# Patient Record
Sex: Female | Born: 1975 | Race: Black or African American | Hispanic: No | Smoking: Current every day smoker
Health system: Southern US, Community
[De-identification: ages and names within clinical notes are randomized; demographics above are authoritative.]

## PROBLEM LIST (undated history)

## (undated) DIAGNOSIS — E7800 Pure hypercholesterolemia, unspecified: Secondary | ICD-10-CM

## (undated) DIAGNOSIS — R87629 Unspecified abnormal cytological findings in specimens from vagina: Secondary | ICD-10-CM

## (undated) DIAGNOSIS — I639 Cerebral infarction, unspecified: Secondary | ICD-10-CM

## (undated) HISTORY — PX: WISDOM TOOTH EXTRACTION: SHX21

## (undated) HISTORY — PX: TUBAL LIGATION: SHX77

---

## 2003-03-23 ENCOUNTER — Inpatient Hospital Stay (HOSPITAL_COMMUNITY): Admission: AD | Admit: 2003-03-23 | Discharge: 2003-03-23 | Payer: Self-pay | Admitting: Obstetrics and Gynecology

## 2003-04-03 ENCOUNTER — Emergency Department (HOSPITAL_COMMUNITY): Admission: EM | Admit: 2003-04-03 | Discharge: 2003-04-03 | Payer: Self-pay | Admitting: Emergency Medicine

## 2003-05-22 ENCOUNTER — Inpatient Hospital Stay (HOSPITAL_COMMUNITY): Admission: AD | Admit: 2003-05-22 | Discharge: 2003-05-22 | Payer: Self-pay | Admitting: *Deleted

## 2003-06-02 ENCOUNTER — Ambulatory Visit (HOSPITAL_COMMUNITY): Admission: RE | Admit: 2003-06-02 | Discharge: 2003-06-02 | Payer: Self-pay | Admitting: *Deleted

## 2003-06-04 ENCOUNTER — Inpatient Hospital Stay (HOSPITAL_COMMUNITY): Admission: AD | Admit: 2003-06-04 | Discharge: 2003-06-04 | Payer: Self-pay | Admitting: Obstetrics and Gynecology

## 2003-08-13 ENCOUNTER — Emergency Department (HOSPITAL_COMMUNITY): Admission: EM | Admit: 2003-08-13 | Discharge: 2003-08-13 | Payer: Self-pay | Admitting: Emergency Medicine

## 2003-10-22 ENCOUNTER — Encounter: Admission: RE | Admit: 2003-10-22 | Discharge: 2003-10-22 | Payer: Self-pay | Admitting: *Deleted

## 2003-10-29 ENCOUNTER — Inpatient Hospital Stay (HOSPITAL_COMMUNITY): Admission: AD | Admit: 2003-10-29 | Discharge: 2003-10-31 | Payer: Self-pay | Admitting: Obstetrics and Gynecology

## 2003-10-30 ENCOUNTER — Encounter (INDEPENDENT_AMBULATORY_CARE_PROVIDER_SITE_OTHER): Payer: Self-pay

## 2003-10-31 ENCOUNTER — Inpatient Hospital Stay (HOSPITAL_COMMUNITY): Admission: AD | Admit: 2003-10-31 | Discharge: 2003-10-31 | Payer: Self-pay | Admitting: *Deleted

## 2004-07-19 ENCOUNTER — Emergency Department (HOSPITAL_COMMUNITY): Admission: EM | Admit: 2004-07-19 | Discharge: 2004-07-19 | Payer: Self-pay | Admitting: Emergency Medicine

## 2004-12-14 ENCOUNTER — Emergency Department (HOSPITAL_COMMUNITY): Admission: EM | Admit: 2004-12-14 | Discharge: 2004-12-14 | Payer: Self-pay | Admitting: Emergency Medicine

## 2005-01-14 ENCOUNTER — Inpatient Hospital Stay (HOSPITAL_COMMUNITY): Admission: AD | Admit: 2005-01-14 | Discharge: 2005-01-14 | Payer: Self-pay | Admitting: Family Medicine

## 2005-01-22 ENCOUNTER — Inpatient Hospital Stay (HOSPITAL_COMMUNITY): Admission: AD | Admit: 2005-01-22 | Discharge: 2005-01-22 | Payer: Self-pay | Admitting: Obstetrics and Gynecology

## 2005-03-05 ENCOUNTER — Inpatient Hospital Stay (HOSPITAL_COMMUNITY): Admission: AD | Admit: 2005-03-05 | Discharge: 2005-03-05 | Payer: Self-pay | Admitting: *Deleted

## 2005-03-06 ENCOUNTER — Emergency Department (HOSPITAL_COMMUNITY): Admission: EM | Admit: 2005-03-06 | Discharge: 2005-03-06 | Payer: Self-pay | Admitting: Emergency Medicine

## 2005-03-08 ENCOUNTER — Inpatient Hospital Stay (HOSPITAL_COMMUNITY): Admission: AD | Admit: 2005-03-08 | Discharge: 2005-03-08 | Payer: Self-pay | Admitting: *Deleted

## 2005-08-09 ENCOUNTER — Ambulatory Visit: Payer: Self-pay | Admitting: Obstetrics and Gynecology

## 2005-08-17 ENCOUNTER — Inpatient Hospital Stay (HOSPITAL_COMMUNITY): Admission: AD | Admit: 2005-08-17 | Discharge: 2005-08-17 | Payer: Self-pay | Admitting: Obstetrics and Gynecology

## 2005-10-19 ENCOUNTER — Emergency Department (HOSPITAL_COMMUNITY): Admission: EM | Admit: 2005-10-19 | Discharge: 2005-10-19 | Payer: Self-pay | Admitting: Emergency Medicine

## 2005-10-24 ENCOUNTER — Emergency Department (HOSPITAL_COMMUNITY): Admission: EM | Admit: 2005-10-24 | Discharge: 2005-10-24 | Payer: Self-pay | Admitting: Emergency Medicine

## 2005-12-19 ENCOUNTER — Emergency Department (HOSPITAL_COMMUNITY): Admission: EM | Admit: 2005-12-19 | Discharge: 2005-12-19 | Payer: Self-pay | Admitting: Emergency Medicine

## 2006-01-10 ENCOUNTER — Encounter: Admission: RE | Admit: 2006-01-10 | Discharge: 2006-01-18 | Payer: Self-pay | Admitting: Orthopaedic Surgery

## 2007-04-10 ENCOUNTER — Emergency Department (HOSPITAL_COMMUNITY): Admission: EM | Admit: 2007-04-10 | Discharge: 2007-04-10 | Payer: Self-pay | Admitting: Emergency Medicine

## 2007-04-15 ENCOUNTER — Emergency Department (HOSPITAL_COMMUNITY): Admission: EM | Admit: 2007-04-15 | Discharge: 2007-04-15 | Payer: Self-pay | Admitting: Emergency Medicine

## 2007-10-10 ENCOUNTER — Ambulatory Visit: Payer: Self-pay | Admitting: *Deleted

## 2007-12-29 ENCOUNTER — Inpatient Hospital Stay (HOSPITAL_COMMUNITY): Admission: AD | Admit: 2007-12-29 | Discharge: 2007-12-29 | Payer: Self-pay | Admitting: Obstetrics & Gynecology

## 2008-02-01 ENCOUNTER — Inpatient Hospital Stay (HOSPITAL_COMMUNITY): Admission: AD | Admit: 2008-02-01 | Discharge: 2008-02-01 | Payer: Self-pay | Admitting: Gynecology

## 2008-04-28 ENCOUNTER — Inpatient Hospital Stay (HOSPITAL_COMMUNITY): Admission: AD | Admit: 2008-04-28 | Discharge: 2008-04-28 | Payer: Self-pay | Admitting: Obstetrics & Gynecology

## 2008-05-26 ENCOUNTER — Emergency Department (HOSPITAL_COMMUNITY): Admission: EM | Admit: 2008-05-26 | Discharge: 2008-05-26 | Payer: Self-pay | Admitting: Emergency Medicine

## 2008-12-01 ENCOUNTER — Inpatient Hospital Stay (HOSPITAL_COMMUNITY): Admission: AD | Admit: 2008-12-01 | Discharge: 2008-12-01 | Payer: Self-pay | Admitting: Obstetrics & Gynecology

## 2009-01-06 ENCOUNTER — Emergency Department (HOSPITAL_COMMUNITY): Admission: EM | Admit: 2009-01-06 | Discharge: 2009-01-06 | Payer: Self-pay | Admitting: Emergency Medicine

## 2009-04-01 ENCOUNTER — Ambulatory Visit: Payer: Self-pay | Admitting: Obstetrics and Gynecology

## 2009-04-01 ENCOUNTER — Encounter: Payer: Self-pay | Admitting: Obstetrics and Gynecology

## 2009-04-25 ENCOUNTER — Emergency Department (HOSPITAL_COMMUNITY): Admission: EM | Admit: 2009-04-25 | Discharge: 2009-04-25 | Payer: Self-pay | Admitting: Emergency Medicine

## 2009-04-29 ENCOUNTER — Inpatient Hospital Stay (HOSPITAL_COMMUNITY): Admission: AD | Admit: 2009-04-29 | Discharge: 2009-04-29 | Payer: Self-pay | Admitting: Obstetrics & Gynecology

## 2009-06-19 ENCOUNTER — Emergency Department (HOSPITAL_COMMUNITY): Admission: EM | Admit: 2009-06-19 | Discharge: 2009-06-19 | Payer: Self-pay | Admitting: Emergency Medicine

## 2009-10-04 ENCOUNTER — Emergency Department (HOSPITAL_COMMUNITY): Admission: EM | Admit: 2009-10-04 | Discharge: 2009-10-04 | Payer: Self-pay | Admitting: Emergency Medicine

## 2009-12-06 ENCOUNTER — Inpatient Hospital Stay (HOSPITAL_COMMUNITY): Admission: AD | Admit: 2009-12-06 | Discharge: 2009-12-06 | Payer: Self-pay | Admitting: Obstetrics & Gynecology

## 2010-07-31 ENCOUNTER — Emergency Department (HOSPITAL_COMMUNITY): Admission: EM | Admit: 2010-07-31 | Discharge: 2010-07-31 | Payer: Self-pay | Admitting: Emergency Medicine

## 2011-01-23 LAB — WET PREP, GENITAL
Clue Cells Wet Prep HPF POC: NONE SEEN
Trich, Wet Prep: NONE SEEN
Yeast Wet Prep HPF POC: NONE SEEN

## 2011-01-23 LAB — GC/CHLAMYDIA PROBE AMP, GENITAL: GC Probe Amp, Genital: NEGATIVE

## 2011-02-09 LAB — DIFFERENTIAL
Basophils Absolute: 0.1 10*3/uL (ref 0.0–0.1)
Eosinophils Relative: 1 % (ref 0–5)
Lymphocytes Relative: 16 % (ref 12–46)
Monocytes Absolute: 0.6 10*3/uL (ref 0.1–1.0)
Monocytes Relative: 7 % (ref 3–12)

## 2011-02-09 LAB — URINALYSIS, ROUTINE W REFLEX MICROSCOPIC
Bilirubin Urine: NEGATIVE
Protein, ur: NEGATIVE mg/dL
Urobilinogen, UA: 0.2 mg/dL (ref 0.0–1.0)

## 2011-02-09 LAB — CBC
HCT: 35 % — ABNORMAL LOW (ref 36.0–46.0)
Hemoglobin: 11.5 g/dL — ABNORMAL LOW (ref 12.0–15.0)
MCHC: 33 g/dL (ref 30.0–36.0)
RBC: 3.89 MIL/uL (ref 3.87–5.11)
RDW: 15.7 % — ABNORMAL HIGH (ref 11.5–15.5)

## 2011-02-09 LAB — BASIC METABOLIC PANEL
CO2: 25 mEq/L (ref 19–32)
GFR calc non Af Amer: >60 mL/min (ref >60)
Glucose, Bld: 61 mg/dL — ABNORMAL LOW (ref 70–99)
Potassium: 3.7 mEq/L (ref 3.5–5.1)
Sodium: 139 mEq/L (ref 135–145)

## 2011-02-14 LAB — WET PREP, GENITAL

## 2011-02-15 LAB — POCT URINALYSIS DIP (DEVICE)
Bilirubin Urine: NEGATIVE
Ketones, ur: NEGATIVE mg/dL
Protein, ur: NEGATIVE mg/dL
Specific Gravity, Urine: >=1.03 (ref 1.005–1.030)

## 2011-02-21 LAB — WET PREP, GENITAL
Trich, Wet Prep: NONE SEEN
Yeast Wet Prep HPF POC: NONE SEEN

## 2011-02-21 LAB — GC/CHLAMYDIA PROBE AMP, GENITAL
Chlamydia, DNA Probe: NEGATIVE
GC Probe Amp, Genital: NEGATIVE

## 2011-02-21 LAB — POCT PREGNANCY, URINE: Preg Test, Ur: NEGATIVE

## 2011-03-22 NOTE — Group Therapy Note (Signed)
NAME:  Wendy Ross, Wendy Ross                ACCOUNT NO.:  [id number]   MEDICAL RECORD NO.:  [id number]          PATIENT TYPE:  WOC   LOCATION:  [facility]                   FACILITY:  [facility]   PHYSICIAN:  Karilyn Cota    DATE OF BIRTH:  May 17, 1976   DATE OF SERVICE:                                  CLINIC NOTE   CHIEF COMPLAINT:  Well woman examination with Pap smear and gonorrhea  and Chlamydia cultures.   HISTORY OF PRESENT ILLNESS:  This is a 35 year old gravida 6, para 4-0-2-  4 who presents for her well woman annual exam and Pap smear.  She also  requests GC Chlamydia cultures.  Her last Pap smear was in December 2008  and was normal.  She reports having a yeast infection 1-2 weeks ago that  she self treated with over-the-counter products with some relief but  still has occasional itching every 2-3 days.  She is having normal  periods with no problems.  She does report hypercholesterolemia that is  treated by her medical doctor and otherwise has no complaints.   PAST MEDICAL HISTORY:  Remarkable for hypercholesterolemia for which she  takes a medication of unknown name that starts with a P twice a day.   PAST SURGICAL HISTORY:  Remarkable for a bilateral tubal ligation.   OBSTETRICS HISTORY:  Remarkable for a vaginal birth and 2 ABs of unknown  type.  She is therefore gravida 6, para 4-0-2-4.   FAMILY HISTORY:  Remarkable for hypertension in her mother, brother and  sister and several members with uterine cancer.   SOCIAL HISTORY:  The patient is the mother of 4.  She smokes  infrequently, as little as 1 cigarette per day.   PHYSICAL EXAMINATION:  GENERAL:  Appears well in no distress.  VITAL SIGNS:  Stable and recorded on chart.  HEENT:  Within normal limits.  Thyroid, not enlarged.  CHEST:  Clear to auscultation.  HEART:  Regular rate and rhythm.  SPINE:  Nontender with negative CVA tenderness.  ABDOMEN:  Soft, nontender with no masses.  Positive bowel sounds.  BREASTS:   Soft, nontender with no masses or unusual findings.  GENITOURINARY:  Normal female external genitalia with no lesions or  erythema.  Vaginal mucosa is pink and moist with small amount of thin  white discharge.  Cervix is anterior, just under the symphysis with the  uterus being slightly retroverted.  Adnexa are normal without masses and  nontender.  EXTREMITIES:  Show no edema or cyanosis.   Pap smear was collected and GC Chlamydia cultures were added to that.  UA is negative with a specific gravity of 1.030 and small blood,  otherwise normal finding.   ASSESSMENT:  70. A 35 year old gravida 6, para 4-0-2-4 (non-pregnant).  Presented to      take her well woman exam.  2. Resolved yeast vaginitis.  3. Pap smear and general cultures requested.   PLAN:  1. Pap sent with GC Chlamydia added.  2. Routine health maintenance to be followed up by her primary      physician.  3. Perineal hygiene was reviewed.  ______________________________  Karilyn Cota     MW/MEDQ  D:  04/01/2009  T:  04/01/2009  Job:  [id number]

## 2011-03-22 NOTE — Group Therapy Note (Signed)
NAME:  Wendy Ross, Wendy Ross                ACCOUNT NO.:  [id number]   MEDICAL RECORD NO.:  [id number]          PATIENT TYPE:  WOC   LOCATION:  [facility]                   FACILITY:  [facility]   PHYSICIAN:  Karlton Lemon, MD      DATE OF BIRTH:  November 28, 1975   DATE OF SERVICE:                                  CLINIC NOTE   CHIEF COMPLAINT:  Well woman examination with pap smear, pain in left  breast, urinary frequency.   HISTORY OF PRESENT ILLNESS:  This is a 35 year old G6 P4-0-2-4,  presenting for a well woman examination and pap smear. The patient's  last pap smear was in October 2006 and at that time was negative  intraepithelial lesion or malignancy. She currently states that she is  having normal periods, though they have decreased in duration from 5 to  3 days. They are still coming monthly and she is just using 3 to 4 pads  daily on her heaviest days. She also complains of pain in her left  lateral breast that is intermittent. She is concerned that it may be  related to strain on it from working. She says that she feels heaviness  in her right breast as well, but she does not feel the pain. She has  done monthly self-breast exams and notes no dimpling, discharge, or  mass. She also states that she is having urinary frequency and needs to  go to the bathroom shortly after she drinks anything. She denies any  dysuria or gross hematuria.   PAST MEDICAL HISTORY:  None.   PAST SURGICAL HISTORY:  Bilateral tubal ligation.   OBSTETRICAL HISTORY:  Gravida 5 para 4-0-2-4. No Caesarean sections.   FAMILY HISTORY:  Hypertension in mother, brother, and sister. She states  that she has had family members with uterine cancer. She denies  history of breast cancer.   SOCIAL HISTORY:  The patient has four children living with her. She  smokes infrequently stating that she will smoke 1 cigarette per day.   PHYSICAL EXAMINATION:  GENERAL:  Well appearing female in no distress.  VITAL SIGNS:   Temperature 97.7, pulse 91, blood pressure 107/67,  respirations 18, weight 176.6 pounds.  CARDIOVASCULAR:  Heart is regular rate and rhythm with no murmurs, rubs,  or gallops.  LUNGS:  Clear to auscultation bilaterally.  ABDOMEN:  Soft and nontender to palpation. Positive bowel sounds in all  four quadrants.  BREASTS:  Left breast is slightly larger than the right breast.They are  pendulous, no dimpling of either breast noted. There is no mass  palpated. No discharge is expressed from the nipple.  GENITOURINARY:  Normal female external genitalia. No lesion is noted.  Vaginal mucosa is pink and moist. Cervix is midline. Uterus is slightly  retroflexed and normal sized adnexa are palpable and not increased in  size. There is no tenderness and no palpation of the adnexa.  EXTREMITIES:  No cyanosis, clubbing, or edema.   LABORATORY DATA:  The patient had a urine pregnancy test performed here  that was negative. She had a urinalysis that showed a small amount of  blood, trace  leukocyte esterase, negative nitrates.   ASSESSMENT AND PLAN:  This is a 35 year old African-American female G6  P4-0-2-4 presenting for a well woman examination with complaints of left  breast pain and urinary frequency.  1. Breast exam is performed today without any suspicious lesions or      changes suggestive of breast cancer. We will counsel to continue      monthly self-breast examinations and use Motrin every 8 hours daily      to control the pain.  2. Pap smear is performed today. The patient is to follow up in one      year for repeat pap smear unless changes on the pap smear dictate      otherwise.  3. The patient is counseled to increase her water intake. This may      help with her urinary symptoms. We will send the urine for a      culture and sensitivity, as there was possible leukocyte esterase      and a small amount of blood that may be indicative of infection.            ______________________________  Karlton Lemon, MD     NS/MEDQ  D:  10/10/2007  T:  10/11/2007  Job:  [id number]

## 2011-03-25 NOTE — Group Therapy Note (Signed)
NAME:  LEAKS, Nelson                ACCOUNT NO.:  [id number]   MEDICAL RECORD NO.:  [id number]          PATIENT TYPE:  WOC   LOCATION:  [facility]                   FACILITY:  [facility]   PHYSICIAN:  Argentina Donovan, MD        DATE OF BIRTH:  October 26, 1976   DATE OF SERVICE:  08/09/2005                                    CLINIC NOTE   CHIEF COMPLAINT:  Patient came for yearly check-up and also clear vaginal  discharge.   SUBJECTIVE:  A 35 year old African-American female who comes in for her  gynecological annual examination, also with a clear white vaginal discharge,  watery, without any itching or odor.   ALLERGIES:  None.   CURRENT MEDICATIONS:  None.   MENSTRUAL HISTORY:  Last menstrual period July 27, 2005.  Menstrual  cycles regular.  Menarche at 35 years old.  Usually heavy, painful periods  without any bleeding between periods.   CONTRACEPTIVE HISTORY:  Patient had a bilateral tubal ligation December  2004.  No possibility to get pregnant at this moment.   OBSTETRICAL HISTORY:  G6, para 6-0-2-4.  No cesarean sections.   GYNECOLOGICAL HISTORY:  Last Pap smear in April 2005.  History of one  abnormal Pap smear in 1995.  No other treatments.   FAMILY HISTORY:  Hypertension in mom, brother and sister.  Cancer with  __________ cancer in grandfather and uncle.   SOCIAL HISTORY:  Patient has four children living with her.  She drinks  alcohol occasionally and smokes one pack of cigarettes per day during the  last eight years.   OBJECTIVE:  VITAL SIGNS:  Reviewed on the chart, within normal limits.  GENERAL:  No acute distress.  CARDIOVASCULAR:  RRR.  No murmur, rub or gallop.  RESPIRATORY:  Clear to auscultation bilaterally.  BREASTS:  Within normal limits bilaterally.  No nipple discharge.  GENITOURINARY:  Speculum examination shows __________.  Thick discharge in  the vault of the vagina.  Cervix shows two nabothian cysts at 2 and 5  o'clock.  Bimanual examination:  There  are no abdominal masses, no adnexal  masses, no cervical motion tenderness.  External genitalia within normal  limits.   ASSESSMENT:  A 36 year old African-American female who comes originally for  annual yearly physical examination.  Gynecological Pap smear performed and  GC/Chlamydia performed.  Wet prep performed and  analyzed at the office.  Positive for Trichomonas and positive for clue cells.   PLAN:  Treatment for Trichomonas and bacterial vaginosis.  Metronidazole 500  mg b.i.d. x7 days prescribed to patient.  Patient advised to use condoms and  have sexual partner obtain treatment.  Pap smear results will be notified to  patient by mail or phone.     ______________________________  Latonda Larrivee    ______________________________  Argentina Donovan, MD    /MEDQ  D:  08/09/2005  T:  08/10/2005  Job:  [id number]

## 2011-03-25 NOTE — Op Note (Signed)
NAME:  Wendy Ross, Wendy Ross                          ACCOUNT NO.:  [id number]   MEDICAL RECORD NO.:  [id number]                   PATIENT TYPE:  INP   LOCATION:  9199                                 FACILITY:  [facility]   PHYSICIAN:  Lesly Dukes, M.D.              DATE OF BIRTH:  Oct 18, 1976   DATE OF PROCEDURE:  10/30/2003  DATE OF DISCHARGE:                                 OPERATIVE REPORT   PREOPERATIVE DIAGNOSIS:  A para 4-0-2-4 desiring permanent sterilization.   POSTOPERATIVE DIAGNOSIS:  A para 4-0-2-4 desiring permanent sterilization.   PROCEDURE:  Postpartum bilateral tubal ligation via the Pomeroy method.[   SURGEON:  Lesly Dukes, M.D.   ANESTHESIA:  Epidural.   ESTIMATED BLOOD LOSS:  Minimal.   COMPLICATIONS:  None.   PATHOLOGY:  Portions of bilateral fallopian tubes.   DESCRIPTION OF PROCEDURE:  After informed consent was obtained, the patient  was taken to the operating room, where epidural anesthesia was found to be  adequate.  The patient prepared and draped in normal sterile fashion while  in supine position.  The infraumbilical skin incision was made with a  scalpel and carried down to the underlying layer of fascia bluntly.  The  fascia was then elevated, incised with the Mayo scissors, and tagged with  Vicryl.  The peritoneum was identified and then entered bluntly using a  Kelly clamp.  The infraumbilical skin incision was then brought down over  the left fallopian tube, which was grabbed with a Babcock clamp, then  followed out to its fimbriated end.  A knuckle was then made with the  Babcock and then 2-0 plain was used to suture ligate the left fallopian  tube.  A second free tie of 2-0 plain was used to ensure the ligature was  tight.  The fallopian tube was then transected and noted to be hemostatic.  The left fallopian tube was then returned to the abdomen, and the right  fallopian tube was then identified, picked up with a Babcock, and followed  out to  the fimbriated end.  The middle portion of the fallopian tube was  then tented up with the Babcock, doubly ligated with 2-0 plain, transected  with Metzenbaum scissors.  The right fallopian tube was also found to be  hemostatic.  Both portions of fallopian tubes were sent to pathology.  The  right fallopian tube was returned to the abdomen.  The fascia was then  closed with 0 Vicryl in a running fashion and the skin was closed with  Dermabond.  The patient tolerated the procedure well.  The sponge, lap,  instrument, and needle count was correct x2.  The patient went to the  recovery room in stable condition.  Lesly Dukes, M.D.    Lora Paula  D:  10/30/2003  T:  10/30/2003  Job:  [id number]

## 2011-03-29 ENCOUNTER — Emergency Department (HOSPITAL_COMMUNITY)
Admission: EM | Admit: 2011-03-29 | Discharge: 2011-03-29 | Disposition: A | Discharge status: Home / Self Care | Payer: Self-pay | Attending: Emergency Medicine | Admitting: Emergency Medicine

## 2011-03-29 DIAGNOSIS — N760 Acute vaginitis: Secondary | ICD-10-CM | POA: Insufficient documentation

## 2011-03-29 DIAGNOSIS — X58XXXA Exposure to other specified factors, initial encounter: Secondary | ICD-10-CM | POA: Insufficient documentation

## 2011-03-29 DIAGNOSIS — T148XXA Other injury of unspecified body region, initial encounter: Secondary | ICD-10-CM | POA: Insufficient documentation

## 2011-03-29 DIAGNOSIS — M545 Low back pain, unspecified: Secondary | ICD-10-CM | POA: Insufficient documentation

## 2011-03-29 DIAGNOSIS — A499 Bacterial infection, unspecified: Secondary | ICD-10-CM | POA: Insufficient documentation

## 2011-03-29 DIAGNOSIS — B9689 Other specified bacterial agents as the cause of diseases classified elsewhere: Secondary | ICD-10-CM | POA: Insufficient documentation

## 2011-03-29 LAB — URINALYSIS, ROUTINE W REFLEX MICROSCOPIC
Glucose, UA: NEGATIVE mg/dL
Protein, ur: NEGATIVE mg/dL
pH: 6 (ref 5.0–8.0)

## 2011-03-29 LAB — WET PREP, GENITAL: Yeast Wet Prep HPF POC: NONE SEEN

## 2011-07-29 LAB — POCT PREGNANCY, URINE
Operator id: 117411
Preg Test, Ur: NEGATIVE

## 2011-07-29 LAB — URINALYSIS, ROUTINE W REFLEX MICROSCOPIC
Ketones, ur: 15 — AB
Specific Gravity, Urine: >1.03 — ABNORMAL HIGH
Urobilinogen, UA: 0.2

## 2011-07-29 LAB — GC/CHLAMYDIA PROBE AMP, GENITAL: GC Probe Amp, Genital: NEGATIVE

## 2011-07-29 LAB — WET PREP, GENITAL: Yeast Wet Prep HPF POC: NONE SEEN

## 2011-07-29 LAB — URINE MICROSCOPIC-ADD ON

## 2011-08-01 LAB — POCT PREGNANCY, URINE
Operator id: 202651
Preg Test, Ur: NEGATIVE

## 2011-08-01 LAB — WET PREP, GENITAL: Yeast Wet Prep HPF POC: NONE SEEN

## 2011-08-01 LAB — GC/CHLAMYDIA PROBE AMP, GENITAL: Chlamydia, DNA Probe: NEGATIVE

## 2011-08-04 LAB — URINALYSIS, ROUTINE W REFLEX MICROSCOPIC
Glucose, UA: NEGATIVE
Specific Gravity, Urine: 1.025

## 2011-08-04 LAB — GC/CHLAMYDIA PROBE AMP, GENITAL
Chlamydia, DNA Probe: NEGATIVE
GC Probe Amp, Genital: NEGATIVE

## 2011-08-04 LAB — URINE MICROSCOPIC-ADD ON

## 2011-08-05 LAB — POCT I-STAT, CHEM 8
Creatinine, Ser: 0.9
Glucose, Bld: 75
Hemoglobin: 13.3
Potassium: 3.8

## 2011-08-05 LAB — WET PREP, GENITAL
Trich, Wet Prep: NONE SEEN
Yeast Wet Prep HPF POC: NONE SEEN

## 2011-08-05 LAB — URINALYSIS, ROUTINE W REFLEX MICROSCOPIC
Glucose, UA: NEGATIVE
Protein, ur: NEGATIVE
pH: 6.5

## 2011-08-05 LAB — GC/CHLAMYDIA PROBE AMP, GENITAL: GC Probe Amp, Genital: NEGATIVE

## 2011-08-15 LAB — POCT URINALYSIS DIP (DEVICE)
Bilirubin Urine: NEGATIVE
Bilirubin Urine: NEGATIVE
Ketones, ur: NEGATIVE
Ketones, ur: NEGATIVE
Nitrite: NEGATIVE
Specific Gravity, Urine: 1.02
Specific Gravity, Urine: 1.02
pH: 5.5
pH: 6

## 2011-10-19 ENCOUNTER — Encounter: Payer: Self-pay | Admitting: Adult Health

## 2011-10-19 ENCOUNTER — Emergency Department (HOSPITAL_COMMUNITY)
Admission: EM | Admit: 2011-10-19 | Discharge: 2011-10-19 | Disposition: A | Discharge status: Home / Self Care | Payer: Self-pay | Attending: Emergency Medicine | Admitting: Emergency Medicine

## 2011-10-19 DIAGNOSIS — J45909 Unspecified asthma, uncomplicated: Secondary | ICD-10-CM | POA: Insufficient documentation

## 2011-10-19 DIAGNOSIS — R6889 Other general symptoms and signs: Secondary | ICD-10-CM

## 2011-10-19 DIAGNOSIS — E7800 Pure hypercholesterolemia, unspecified: Secondary | ICD-10-CM | POA: Insufficient documentation

## 2011-10-19 DIAGNOSIS — J111 Influenza due to unidentified influenza virus with other respiratory manifestations: Secondary | ICD-10-CM | POA: Insufficient documentation

## 2011-10-19 HISTORY — DX: Pure hypercholesterolemia, unspecified: E78.00

## 2011-10-19 LAB — RAPID STREP SCREEN (MED CTR MEBANE ONLY): Streptococcus, Group A Screen (Direct): NEGATIVE

## 2011-10-19 MED ORDER — ONDANSETRON 4 MG PO TBDP
4.0000 mg | ORAL_TABLET | Freq: Once | ORAL | Status: AC
  Administered 2011-10-19: 4 mg via ORAL
  Filled 2011-10-19: qty 1

## 2011-10-19 MED ORDER — KETOROLAC TROMETHAMINE 30 MG/ML IJ SOLN
30.0000 mg | Freq: Once | INTRAMUSCULAR | Status: AC
  Administered 2011-10-19: 30 mg via INTRAMUSCULAR
  Filled 2011-10-19: qty 1

## 2011-10-19 MED ORDER — IBUPROFEN 600 MG PO TABS: 600.0000 mg | ORAL_TABLET | Freq: Four times a day (QID) | ORAL | Status: AC | PRN

## 2011-10-19 MED ORDER — OSELTAMIVIR PHOSPHATE 75 MG PO CAPS: 75.0000 mg | ORAL_CAPSULE | Freq: Two times a day (BID) | ORAL | Status: AC

## 2011-10-19 NOTE — ED Provider Notes (Signed)
History     CSN: [id number] Arrival date & time: 10/19/2011  8:15 AM   First MD Initiated Contact with Patient 10/19/11 [phone]      Chief Complaint  Patient presents with  . Influenza   HPI  35yoF h/o well controlled asthma pw multiple complaints. Yesterday began exp diffuse body aches, frontal headache, nasal congestion, sore throat, rhinorrhea. Mild nausea, occ post tussive emesis. Denies abdominal pain, back pain, chest pain. No diarrhea. Denies hematuria/dysuria/freq/urgency. Daughter recently with URI sx.Denies h/o DM, HIV. Denies other complaints today.  Notes, ED Provider Notes from 10/19/11 0000 to 10/19/11 08:19:54       Janett Billow Delories Heinz, RN 10/19/2011 08:18      C/o cough, body aches and coughing up phlem. States her head and body hurts. Started yesterday. vomitted 2, denies diarrhea.     Past Medical History  Diagnosis Date  . Asthma   . Hypercholesteremia     History reviewed. No pertinent past surgical history.  History reviewed. No pertinent family history.  History  Substance Use Topics  . Smoking status: Current Everyday Smoker  . Smokeless tobacco: Not on file  . Alcohol Use: No    OB History    Grav Para Term Preterm Abortions TAB SAB Ect Mult Living                  Review of Systems  All other systems reviewed and are negative.   except as noted HPI   Allergies  Review of patient's allergies indicates no known allergies.  Home Medications   Current Outpatient Rx  Name Route Sig Dispense Refill  . IBUPROFEN 600 MG PO TABS Oral Take 1 tablet (600 mg total) by mouth every 6 (six) hours as needed for pain. 30 tablet 0  . OSELTAMIVIR PHOSPHATE 75 MG PO CAPS Oral Take 1 capsule (75 mg total) by mouth every 12 (twelve) hours. 10 capsule 0    BP 109/73  Pulse 86  Temp(Src) 98.6 F (37 C) (Oral)  Resp 18  SpO2 100%  LMP 10/05/2011  Physical Exam  Nursing note and vitals reviewed. Constitutional: She is oriented to person, place, and  time. She appears well-developed.  HENT:  Head: Atraumatic.  Mouth/Throat: Oropharynx is clear and moist.       Mild posterior OP erythema Uvula midline No exudates No trismus  Eyes: Conjunctivae and EOM are normal. Pupils are equal, round, and reactive to light.  Neck: Normal range of motion. Neck supple.  Cardiovascular: Normal rate, regular rhythm, normal heart sounds and intact distal pulses.   Pulmonary/Chest: Effort normal and breath sounds normal. No respiratory distress. She has no wheezes. She has no rales.  Abdominal: Soft. She exhibits no distension. There is no tenderness. There is no rebound and no guarding.  Musculoskeletal: Normal range of motion.  Neurological: She is alert and oriented to person, place, and time.  Skin: Skin is warm and dry. No rash noted.  Psychiatric: She has a normal mood and affect.    ED Course  Procedures (including critical care time)   Labs Reviewed  RAPID STREP SCREEN  STREP A DNA PROBE   No results found.   1. Flu-like symptoms     MDM  Flu like illness. Immunocompetent. Check rapid strep. Toradol, zofran ODT. Anticipate d/c home with tamiflu given onset of sx yesterday.  Stefano Gaul, MD  Rapid strep neg. Culture sent. Home with Tamiflu, supportive care         Leigh-Ann  Hyman Hopes, MD 10/19/11 [phone]

## 2011-10-19 NOTE — ED Notes (Signed)
Pt reports body aches, productive cough, sore throat x1 day. Pt is A/O x4. Skin warm and dry. Respirations even and unlabored. NAD noted at this time.

## 2011-10-19 NOTE — ED Notes (Signed)
C/o cough, body aches and coughing up phlem. States her head and body hurts. Started yesterday. vomitted 2, denies diarrhea.

## 2011-10-20 LAB — STREP A DNA PROBE: Group A Strep Probe: NEGATIVE

## 2012-09-10 ENCOUNTER — Other Ambulatory Visit: Payer: Self-pay | Admitting: Family Medicine

## 2012-09-10 ENCOUNTER — Ambulatory Visit
Admission: RE | Admit: 2012-09-10 | Discharge: 2012-09-10 | Disposition: A | Discharge status: Home / Self Care | Payer: Medicaid Other | Source: Ambulatory Visit | Attending: Family Medicine | Admitting: Family Medicine

## 2012-09-10 DIAGNOSIS — F17200 Nicotine dependence, unspecified, uncomplicated: Secondary | ICD-10-CM

## 2012-09-10 DIAGNOSIS — M545 Low back pain: Secondary | ICD-10-CM

## 2012-09-10 DIAGNOSIS — M25559 Pain in unspecified hip: Secondary | ICD-10-CM

## 2013-01-09 ENCOUNTER — Encounter (HOSPITAL_COMMUNITY): Payer: Self-pay

## 2013-01-09 ENCOUNTER — Emergency Department (HOSPITAL_COMMUNITY)
Admission: EM | Admit: 2013-01-09 | Discharge: 2013-01-09 | Disposition: A | Discharge status: Home / Self Care | Payer: Medicaid Other | Attending: Emergency Medicine | Admitting: Emergency Medicine

## 2013-01-09 ENCOUNTER — Emergency Department (HOSPITAL_COMMUNITY): Payer: Medicaid Other

## 2013-01-09 DIAGNOSIS — M62838 Other muscle spasm: Secondary | ICD-10-CM | POA: Insufficient documentation

## 2013-01-09 DIAGNOSIS — Z809 Family history of malignant neoplasm, unspecified: Secondary | ICD-10-CM | POA: Insufficient documentation

## 2013-01-09 DIAGNOSIS — J45909 Unspecified asthma, uncomplicated: Secondary | ICD-10-CM | POA: Insufficient documentation

## 2013-01-09 DIAGNOSIS — F17200 Nicotine dependence, unspecified, uncomplicated: Secondary | ICD-10-CM | POA: Insufficient documentation

## 2013-01-09 DIAGNOSIS — R0789 Other chest pain: Secondary | ICD-10-CM | POA: Insufficient documentation

## 2013-01-09 DIAGNOSIS — Z79899 Other long term (current) drug therapy: Secondary | ICD-10-CM | POA: Insufficient documentation

## 2013-01-09 DIAGNOSIS — Z8249 Family history of ischemic heart disease and other diseases of the circulatory system: Secondary | ICD-10-CM | POA: Insufficient documentation

## 2013-01-09 DIAGNOSIS — E876 Hypokalemia: Secondary | ICD-10-CM | POA: Insufficient documentation

## 2013-01-09 DIAGNOSIS — M549 Dorsalgia, unspecified: Secondary | ICD-10-CM | POA: Insufficient documentation

## 2013-01-09 DIAGNOSIS — E7800 Pure hypercholesterolemia, unspecified: Secondary | ICD-10-CM | POA: Insufficient documentation

## 2013-01-09 LAB — CBC WITH DIFFERENTIAL/PLATELET
Basophils Relative: 0 % (ref 0–1)
HCT: 31.7 % — ABNORMAL LOW (ref 36.0–46.0)
Hemoglobin: 10.3 g/dL — ABNORMAL LOW (ref 12.0–15.0)
MCH: 27.8 pg (ref 26.0–34.0)
MCHC: 32.5 g/dL (ref 30.0–36.0)
Monocytes Absolute: 0.4 10*3/uL (ref 0.1–1.0)
Monocytes Relative: 9 % (ref 3–12)
Neutro Abs: 2.3 10*3/uL (ref 1.7–7.7)

## 2013-01-09 LAB — COMPREHENSIVE METABOLIC PANEL
Albumin: 3.4 g/dL — ABNORMAL LOW (ref 3.5–5.2)
BUN: 8 mg/dL (ref 6–23)
Chloride: 105 mEq/L (ref 96–112)
Creatinine, Ser: 0.79 mg/dL (ref 0.50–1.10)
GFR calc Af Amer: >90 mL/min (ref >90)
GFR calc non Af Amer: >90 mL/min (ref >90)
Total Bilirubin: 0.3 mg/dL (ref 0.3–1.2)

## 2013-01-09 LAB — POCT I-STAT TROPONIN I: Troponin i, poc: 0 ng/mL (ref 0.00–0.08)

## 2013-01-09 MED ORDER — NAPROXEN 500 MG PO TABS: 500.0000 mg | ORAL_TABLET | Freq: Two times a day (BID) | ORAL | Status: DC

## 2013-01-09 MED ORDER — KETOROLAC TROMETHAMINE 30 MG/ML IJ SOLN
30.0000 mg | Freq: Once | INTRAMUSCULAR | Status: AC
  Administered 2013-01-09: 30 mg via INTRAVENOUS
  Filled 2013-01-09: qty 1

## 2013-01-09 MED ORDER — POTASSIUM CHLORIDE CRYS ER 20 MEQ PO TBCR: 20.0000 meq | EXTENDED_RELEASE_TABLET | Freq: Two times a day (BID) | ORAL | Status: DC

## 2013-01-09 MED ORDER — CYCLOBENZAPRINE HCL 10 MG PO TABS: 10.0000 mg | ORAL_TABLET | Freq: Two times a day (BID) | ORAL | Status: DC | PRN

## 2013-01-09 NOTE — ED Notes (Signed)
Pt escorted to discharge window. Pt verbalized understanding discharge instructions. In no acute distress.  

## 2013-01-09 NOTE — ED Provider Notes (Signed)
History     CSN: [id number]  Arrival date & time 01/09/13  7829   First MD Initiated Contact with Patient 01/09/13 0703      Chief Complaint  Patient presents with  . Chest Pain    (Consider location/radiation/quality/duration/timing/severity/associated sxs/prior treatment) HPI Comments: 37 year old female with a history of asthma and recently diagnosed high cholesterol presents with a complaint of left-sided back pain and left-sided chest pain. She states that over the last month she has had a vague heavy sensation in her chest which occurs randomly but may also come on with exertion. In the last 24 hours she has developed a very sharp left-sided back pain which is just below her scapula, become severe and required her to sit down and rest. This morning she noticed that she could not roll in the bed, moving from side to side, any movement of her arms her back, severe pain in her left mid back below her shoulder blade.  At this time the pain is mild but present. She is a smoker, she has a family history of cardiac disease stating that her mother had obstructive disease around her age There is also history of cancer in the family but the patient herself does not have cancer. She is not any swelling in her legs, she denies, trauma, travel, immobilization her medication list is not reveal hormone therapy, she does smoke cigarettes.  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   Patient is a 37 y.o. female presenting with chest pain. The history is provided by the patient and a relative.  Chest Pain   Past Medical History  Diagnosis Date  . Asthma   . Hypercholesteremia     History reviewed. No pertinent past surgical history.  History reviewed. No pertinent family history.  History  Substance Use Topics  . Smoking status: Current Every Day Smoker  . Smokeless tobacco: Not on file  . Alcohol  Use: No    OB History   Grav Para Term Preterm Abortions TAB SAB Ect Mult Living                  Review of Systems  Cardiovascular: Positive for chest pain.  All other systems reviewed and are negative.    Allergies  Review of patient's allergies indicates no known allergies.  Home Medications   Current Outpatient Rx  Name  Route  Sig  Dispense  Refill  . cyclobenzaprine (FLEXERIL) 10 MG tablet   Oral   Take 1 tablet (10 mg total) by mouth 2 (two) times daily as needed for muscle spasms.   20 tablet   0   . naproxen (NAPROSYN) 500 MG tablet   Oral   Take 1 tablet (500 mg total) by mouth 2 (two) times daily with a meal.   30 tablet   0   . potassium chloride SA (K-DUR,KLOR-CON) 20 MEQ tablet   Oral   Take 1 tablet (20 mEq total) by mouth 2 (two) times daily.   10 tablet   0     BP 139/82  Pulse 63  Temp(Src) 98.4 F (36.9 C) (Oral)  Resp 14  SpO2 100%  LMP 01/09/2013  Physical Exam  Nursing note and vitals reviewed. Constitutional: She appears well-developed and well-nourished. No distress.  HENT:  Head: Normocephalic and atraumatic.  Mouth/Throat: Oropharynx is clear and moist. No oropharyngeal exudate.  Eyes: Conjunctivae and EOM are normal.  Pupils are equal, round, and reactive to light. Right eye exhibits no discharge. Left eye exhibits no discharge. No scleral icterus.  Neck: Normal range of motion. Neck supple. No JVD present. No thyromegaly present.  Cardiovascular: Normal rate, regular rhythm, normal heart sounds and intact distal pulses.  Exam reveals no gallop and no friction rub.   No murmur heard. Pulmonary/Chest: Effort normal. No respiratory distress. She has no wheezes. She has rales (soft rales that clear with deep breathing at the lung base on the left).  Abdominal: Soft. Bowel sounds are normal. She exhibits no distension and no mass. There is no tenderness.  Musculoskeletal: Normal range of motion. She exhibits tenderness (reproducible  tenderness to palpation inferior to the left scapula). She exhibits no edema.  Lymphadenopathy:    She has no cervical adenopathy.  Neurological: She is alert. Coordination normal.  Skin: Skin is warm and dry. No rash noted. No erythema.  Psychiatric: She has a normal mood and affect. Her behavior is normal.    ED Course  Procedures (including critical care time)  Labs Reviewed  CBC WITH DIFFERENTIAL - Abnormal; Notable for the following:    RBC 3.71 (*)    Hemoglobin 10.3 (*)    HCT 31.7 (*)    RDW 16.2 (*)    Platelets 426 (*)    All other components within normal limits  COMPREHENSIVE METABOLIC PANEL - Abnormal; Notable for the following:    Potassium 3.1 (*)    Albumin 3.4 (*)    All other components within normal limits  POCT I-STAT TROPONIN I   Dg Chest 2 View  01/09/2013  *RADIOLOGY REPORT*  Clinical Data: Shortness of breath, left chest pain.  CHEST - 2 VIEW  Comparison: 09/10/2012  Findings: Mild bronchitic changes.  No focal consolidation.  No pleural effusion or pneumothorax.  The heart is normal in size.  Mild degenerative changes of the visualized thoracolumbar spine.  IMPRESSION: No evidence of acute cardiopulmonary disease.   Original Report Authenticated By: Charline Bills, M.D.      1. Back pain   2. Muscle spasm   3. Hypokalemia   4. Chest pain       MDM  The patient has no peripheral edema, no wrist factors for pulmonary embolism and an EKG that does not show tachycardia, vital signs that do not show hypoxia. Pulse of 72, oxygen saturation 100%, afebrile. She has not been coughing, not been febrile and has no wrist factors for PE. She does have a slight exertional chest heaviness during the last month ago her primary complaint at this time as this left back pain which is very reproducible with movement. When I sit the patient up in the bed to listen to her posterior lung sounds she has severe pain becomes very tearful. Will rule out pneumothorax, pneumonia,  less likely acute coronary syndrome with normal EKG and ongoing symptoms. Patient will likely benefit from cardiology referral given her risk factors.  ED ECG REPORT  I personally interpreted this EKG   Date: 01/09/2013   Rate: 75  Rhythm: normal sinus rhythm  QRS Axis: normal  Intervals: normal  ST/T Wave abnormalities: normal  Conduction Disutrbances:none  Narrative Interpretation:   Old EKG Reviewed: none available   The patient states that she feels better after getting medications. She has normal troponin, slight hypokalemia and a chest x-ray which does not reveal any abnormalities.  PA and lateral views of the chest were obtained by digital radiography. I have personally interpreted  these x-rays and find her to be no signs of pulmonary infiltrate, cardiomegaly, subdiaphragmatic free air, soft tissue abnormality, no obvious bony abnormalities or fractures.  I believe that the patient is stable for discharge however I would like her to followup in the cardiology clinic secondary to what could be early anginal symptoms. She does not have unstable angina, she does not have an acute coronary syndrome.  Cardiology paged 0800 AM  [phone] - d/w Cardiology - Trish will have office call in the morning.  Vida Roller, MD 01/09/13 [phone]

## 2013-01-09 NOTE — ED Notes (Signed)
Pt alert and oriented x4. Respirations even and unlabored, bilateral symmetrical rise and fall of chest. Skin warm and dry. In no acute distress. Denies needs.   

## 2013-01-09 NOTE — ED Notes (Signed)
Pt presents to the ED through triage yelling and crying holding her chest and complaining of severe chest pain that started this am

## 2013-04-25 ENCOUNTER — Encounter (HOSPITAL_COMMUNITY): Payer: Self-pay | Admitting: Emergency Medicine

## 2013-04-25 ENCOUNTER — Emergency Department (HOSPITAL_COMMUNITY): Payer: Medicaid Other

## 2013-04-25 ENCOUNTER — Emergency Department (HOSPITAL_COMMUNITY)
Admission: EM | Admit: 2013-04-25 | Discharge: 2013-04-25 | Disposition: A | Discharge status: Home / Self Care | Payer: Medicaid Other | Attending: Emergency Medicine | Admitting: Emergency Medicine

## 2013-04-25 DIAGNOSIS — Z862 Personal history of diseases of the blood and blood-forming organs and certain disorders involving the immune mechanism: Secondary | ICD-10-CM | POA: Insufficient documentation

## 2013-04-25 DIAGNOSIS — J45909 Unspecified asthma, uncomplicated: Secondary | ICD-10-CM | POA: Insufficient documentation

## 2013-04-25 DIAGNOSIS — M546 Pain in thoracic spine: Secondary | ICD-10-CM | POA: Insufficient documentation

## 2013-04-25 DIAGNOSIS — F17200 Nicotine dependence, unspecified, uncomplicated: Secondary | ICD-10-CM | POA: Insufficient documentation

## 2013-04-25 DIAGNOSIS — R071 Chest pain on breathing: Secondary | ICD-10-CM | POA: Insufficient documentation

## 2013-04-25 DIAGNOSIS — Z8639 Personal history of other endocrine, nutritional and metabolic disease: Secondary | ICD-10-CM | POA: Insufficient documentation

## 2013-04-25 DIAGNOSIS — R0789 Other chest pain: Secondary | ICD-10-CM

## 2013-04-25 LAB — BASIC METABOLIC PANEL
GFR calc Af Amer: >90 mL/min (ref >90)
GFR calc non Af Amer: >90 mL/min (ref >90)
Potassium: 3.6 mEq/L (ref 3.5–5.1)
Sodium: 135 mEq/L (ref 135–145)

## 2013-04-25 LAB — D-DIMER, QUANTITATIVE: D-Dimer, Quant: <0.27 ug/mL-FEU (ref 0.00–0.48)

## 2013-04-25 LAB — CBC
Hemoglobin: 10 g/dL — ABNORMAL LOW (ref 12.0–15.0)
MCHC: 33.1 g/dL (ref 30.0–36.0)
Platelets: 339 10*3/uL (ref 150–400)
RDW: 16.8 % — ABNORMAL HIGH (ref 11.5–15.5)

## 2013-04-25 LAB — TROPONIN I: Troponin I: <0.3 ng/mL (ref <0.30)

## 2013-04-25 MED ORDER — OXYCODONE-ACETAMINOPHEN 5-325 MG PO TABS: 2.0000 | ORAL_TABLET | ORAL | Status: DC | PRN

## 2013-04-25 NOTE — ED Notes (Signed)
Per EMS, patient had stated that she was bending over to pick something up this morning at 0700 and her back started hurting.  She described pain as radiating to her chest.   Patient states intermittent and had this before.   Patient claims that her L arm felt "heavy".   Patient was given Toradol 30 ml IV by EMS and reported some relief.   Patient has 20 L AC placed by EMS.

## 2013-04-25 NOTE — ED Provider Notes (Signed)
History     CSN: [id number]  Arrival date & time 04/25/13  0756   First MD Initiated Contact with Patient 04/25/13 [phone]      Chief Complaint  Patient presents with  . Back Pain  . Chest Pain    (Consider location/radiation/quality/duration/timing/severity/associated sxs/prior treatment) HPI Comments: Patient woke this morning feeling well.  She bent over to pick an object up off the floor and felt a sharp pain in the left upper back that radiates into her left chest.  It is worse with breathing, movement, and change of position.  She says the pain is restricting her ability to take a deep breath.  She denies productive cough, fever.    Patient is a 37 y.o. female presenting with back pain.  Back Pain Pain location: upper thoracic regionon the left. Quality:  Stabbing Radiates to: left chest. Pain severity:  Moderate Pain is:  Same all the time Onset quality:  Sudden Duration:  2 hours Timing:  Constant Progression:  Unchanged Context: not falling, not recent injury and not twisting   Relieved by:  Nothing Worsened by:  Bending, movement, touching and twisting Ineffective treatments:  None tried   Past Medical History  Diagnosis Date  . Asthma   . Hypercholesteremia     History reviewed. No pertinent past surgical history.  No family history on file.  History  Substance Use Topics  . Smoking status: Current Every Day Smoker  . Smokeless tobacco: Not on file  . Alcohol Use: No    OB History   Grav Para Term Preterm Abortions TAB SAB Ect Mult Living                  Review of Systems  Musculoskeletal: Positive for back pain.  All other systems reviewed and are negative.    Allergies  Review of patient's allergies indicates no known allergies.  Home Medications   Current Outpatient Rx  Name  Route  Sig  Dispense  Refill  . cyclobenzaprine (FLEXERIL) 10 MG tablet   Oral   Take 1 tablet (10 mg total) by mouth 2 (two) times daily as needed for muscle  spasms.   20 tablet   0   . naproxen (NAPROSYN) 500 MG tablet   Oral   Take 1 tablet (500 mg total) by mouth 2 (two) times daily with a meal.   30 tablet   0     BP 108/74  Pulse 62  Temp(Src) 98.5 F (36.9 C) (Oral)  Resp 20  SpO2 100%  Physical Exam  Nursing note and vitals reviewed. Constitutional: She is oriented to person, place, and time. She appears well-developed and well-nourished. No distress.  HENT:  Head: Normocephalic and atraumatic.  Neck: Normal range of motion. Neck supple.  Cardiovascular: Normal rate and regular rhythm.  Exam reveals no gallop and no friction rub.   No murmur heard. Pulmonary/Chest: Effort normal and breath sounds normal. No respiratory distress. She has no wheezes.  Abdominal: Soft. Bowel sounds are normal. She exhibits no distension. There is no tenderness.  Musculoskeletal: Normal range of motion.  There is ttp in the soft tissues of the upper back next to the shoulder blade.    Neurological: She is alert and oriented to person, place, and time.  Skin: Skin is warm and dry. She is not diaphoretic.    ED Course  Procedures (including critical care time)  Labs Reviewed  CBC  BASIC METABOLIC PANEL  TROPONIN I  D-DIMER, QUANTITATIVE  No results found.   No diagnosis found.   Date: 04/25/2013  Rate: 72  Rhythm: normal sinus rhythm  QRS Axis: normal  Intervals: normal  ST/T Wave abnormalities: normal  Conduction Disutrbances:none  Narrative Interpretation:   Old EKG Reviewed: unchanged    MDM  The labs, chest xray, and ekg are all negative.  The cardiac workup does not show a cardiac etiology and the d-dimer is negative, thus ruling out pe.  I suspect this is musculoskeletal in nature and and I believe her to be stable for discharge.  Will treat with nsaids, percocet.         Geoffery Lyons, MD 04/25/13 [phone]

## 2013-04-25 NOTE — ED Notes (Signed)
New and old EKG given to Dr. Judd Lien. Copy placed in pt chart.

## 2013-04-25 NOTE — ED Notes (Signed)
Patient returned from radiology

## 2013-04-25 NOTE — ED Notes (Signed)
Patient taken to radiology 

## 2013-09-11 ENCOUNTER — Encounter (HOSPITAL_COMMUNITY): Payer: Self-pay | Admitting: Emergency Medicine

## 2013-09-11 ENCOUNTER — Emergency Department (HOSPITAL_COMMUNITY)
Admission: EM | Admit: 2013-09-11 | Discharge: 2013-09-11 | Disposition: A | Discharge status: Home / Self Care | Payer: Medicaid Other | Source: Home / Self Care

## 2013-09-11 ENCOUNTER — Other Ambulatory Visit (HOSPITAL_COMMUNITY)
Admission: RE | Admit: 2013-09-11 | Discharge: 2013-09-11 | Disposition: A | Discharge status: Home / Self Care | Payer: Medicaid Other | Source: Ambulatory Visit | Attending: Family Medicine | Admitting: Family Medicine

## 2013-09-11 DIAGNOSIS — N760 Acute vaginitis: Secondary | ICD-10-CM | POA: Insufficient documentation

## 2013-09-11 DIAGNOSIS — N920 Excessive and frequent menstruation with regular cycle: Secondary | ICD-10-CM

## 2013-09-11 DIAGNOSIS — Z113 Encounter for screening for infections with a predominantly sexual mode of transmission: Secondary | ICD-10-CM | POA: Insufficient documentation

## 2013-09-11 DIAGNOSIS — N945 Secondary dysmenorrhea: Secondary | ICD-10-CM

## 2013-09-11 DIAGNOSIS — N730 Acute parametritis and pelvic cellulitis: Secondary | ICD-10-CM

## 2013-09-11 DIAGNOSIS — N946 Dysmenorrhea, unspecified: Secondary | ICD-10-CM

## 2013-09-11 LAB — POCT PREGNANCY, URINE: Preg Test, Ur: NEGATIVE

## 2013-09-11 LAB — POCT URINALYSIS DIP (DEVICE)
Bilirubin Urine: NEGATIVE
Ketones, ur: NEGATIVE mg/dL
Leukocytes, UA: NEGATIVE
Nitrite: NEGATIVE
Protein, ur: NEGATIVE mg/dL
Urobilinogen, UA: 0.2 mg/dL (ref 0.0–1.0)
pH: 6 (ref 5.0–8.0)

## 2013-09-11 MED ORDER — LIDOCAINE HCL (PF) 1 % IJ SOLN
INTRAMUSCULAR | Status: AC
  Filled 2013-09-11: qty 5

## 2013-09-11 MED ORDER — ONDANSETRON HCL 4 MG PO TABS: 4.0000 mg | ORAL_TABLET | Freq: Four times a day (QID) | ORAL | Status: DC

## 2013-09-11 MED ORDER — NAPROXEN 375 MG PO TABS: 375.0000 mg | ORAL_TABLET | Freq: Two times a day (BID) | ORAL | Status: DC

## 2013-09-11 MED ORDER — AZITHROMYCIN 250 MG PO TABS: ORAL_TABLET | ORAL | Status: DC

## 2013-09-11 MED ORDER — CEFTRIAXONE SODIUM 250 MG IJ SOLR
INTRAMUSCULAR | Status: AC
  Filled 2013-09-11: qty 250

## 2013-09-11 MED ORDER — CEFTRIAXONE SODIUM 250 MG IJ SOLR: 250.0000 mg | Freq: Once | INTRAMUSCULAR | Status: DC

## 2013-09-11 NOTE — ED Provider Notes (Signed)
CSN: [id number]     Arrival date & time 09/11/13  0803 History   First MD Initiated Contact with Patient 09/11/13 [phone]     Chief Complaint  Patient presents with  . Abdominal Pain   (Consider location/radiation/quality/duration/timing/severity/associated sxs/prior Treatment) HPI Comments: 37 year old African American female presents with menstrual cramps increasing over the past several months. 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 In between menstrual flow she also has near constant menstrual pain. In addition her days of flow had increased as well. She is having blood clots with heavier flow. She has not seen a PCP and has not been to the [facility] hospital for medical management of these problems.   Past Medical History  Diagnosis Date  . Asthma   . Hypercholesteremia    History reviewed. No pertinent past surgical history. History reviewed. No pertinent family history. History  Substance Use Topics  . Smoking status: Current Every Day Smoker  . Smokeless tobacco: Not on file  . Alcohol Use: No   OB History   Grav Para Term Preterm Abortions TAB SAB Ect Mult Living                 Review of Systems  Constitutional: Positive for activity change. Negative for fever.  HENT: Negative.   Respiratory: Negative.   Cardiovascular: Negative.   Gastrointestinal: Positive for nausea. Negative for vomiting.  Genitourinary: Positive for vaginal bleeding, menstrual problem and pelvic pain. Negative for dysuria, frequency, flank pain, vaginal discharge and difficulty urinating.  Musculoskeletal: Negative.   Skin: Negative.   Neurological: Negative for seizures and syncope.    Allergies  Review of patient's allergies indicates no known allergies.  Home Medications   Current Outpatient Rx  Name  Route  Sig  Dispense   Refill  . azithromycin (ZITHROMAX) 250 MG tablet      Take all 4 tabs po today   4 tablet   0   . naproxen (NAPROSYN) 375 MG tablet   Oral   Take 1 tablet (375 mg total) by mouth 2 (two) times daily.   20 tablet   0   . ondansetron (ZOFRAN) 4 MG tablet   Oral   Take 1 tablet (4 mg total) by mouth every 6 (six) hours.   12 tablet   0    BP 134/97  Pulse 70  Temp(Src) 98.2 F (36.8 C) (Oral)  Resp 20  SpO2 100%  LMP 09/10/2013 Physical Exam  Nursing note and vitals reviewed. Constitutional: She is oriented to person, place, and time. She appears well-developed and well-nourished. No distress.  Eyes: Conjunctivae and EOM are normal.  Neck: Normal range of motion. Neck supple.  Cardiovascular: Normal rate and normal heart sounds.   Pulmonary/Chest: Effort normal.  Abdominal: Soft. Bowel sounds are normal. She exhibits no distension and no mass. There is tenderness. There is no rebound and no guarding.  Tenderness palpated over the suprapubic is an pelvis.  Genitourinary:  Normal external female genitalia with blood flow in the introitus and vagina. The cervix was captured he is relatively enlarged and anterior. There is small amount of bleeding from the os. No discharge is seen. Just blood. There is positive CMT as well as bilateral adnexal tenderness, right greater than left.  Neurological: She is alert and oriented to person, place, and time. She exhibits normal  muscle tone.  Skin: Skin is warm and dry.  Psychiatric: She has a normal mood and affect.    ED Course  Procedures (including critical care time) Labs Review Labs Reviewed  POCT URINALYSIS DIP (DEVICE) - Abnormal; Notable for the following:    Hgb urine dipstick MODERATE (*)    All other components within normal limits  CERVICOVAGINAL ANCILLARY ONLY   Imaging Review No results found.  Results for orders placed during the hospital encounter of 09/11/13  POCT URINALYSIS DIP (DEVICE)      Result Value  Range   Glucose, UA NEGATIVE  NEGATIVE mg/dL   Bilirubin Urine NEGATIVE  NEGATIVE   Ketones, ur NEGATIVE  NEGATIVE mg/dL   Specific Gravity, Urine >=1.030  1.005 - 1.030   Hgb urine dipstick MODERATE (*) NEGATIVE   pH 6.0  5.0 - 8.0   Protein, ur NEGATIVE  NEGATIVE mg/dL   Urobilinogen, UA 0.2  0.0 - 1.0 mg/dL   Nitrite NEGATIVE  NEGATIVE   Leukocytes, UA NEGATIVE  NEGATIVE     MDM   1. Secondary dysmenorrhea   2. PID (acute pelvic inflammatory disease)   3. Menorrhagia    It is possible that this is secondary to PID. However she will need additional workup that may include ultrasound and blood work. Rocephin 250mg  IM Azithromycin 1 gm po zofran 4mg  q 6h prn.   Follow at [facility] soon, especially if not improved in 2-3 days.   Hayden Rasmussen, NP 09/11/13 [phone]

## 2013-09-11 NOTE — ED Notes (Signed)
Waiting on pharmacy to bring medication

## 2013-09-11 NOTE — ED Notes (Addendum)
C/o gradual worsening of pain , cramping, increased flow  w menses over past few months; Onset current menses yesterday, frequent pad and tampon changes; looks uncomfortable; tubal ligation 10 yrs ago; no focal pain area; also c/o HA, weakness

## 2013-09-12 NOTE — ED Provider Notes (Signed)
Medical screening examination/treatment/procedure(s) were performed by a resident physician or non-physician practitioner and as the supervising physician I was immediately available for consultation/collaboration.  Clementeen Graham, MD    Rodolph Bong, MD 09/12/13 [phone]

## 2013-09-13 NOTE — ED Notes (Signed)
Final report of labs available for review, all negative. As final d/c dx was PID, discussed w Dr Lorenz Coaster what information to provide her. Called patient, and have  Advised of negative findings, and to follow up w [facility] for her probable endometriosis and/or fibroid tumour . Patient seemed relieved to know she did NOT have a contagious issue

## 2013-09-13 NOTE — ED Notes (Signed)
Lab review

## 2013-09-27 ENCOUNTER — Emergency Department (HOSPITAL_COMMUNITY)
Admission: EM | Admit: 2013-09-27 | Discharge: 2013-09-27 | Discharge status: Against Medical Advice | Payer: Medicaid Other

## 2013-11-12 ENCOUNTER — Encounter (HOSPITAL_COMMUNITY): Payer: Self-pay | Admitting: Emergency Medicine

## 2013-11-12 ENCOUNTER — Emergency Department (HOSPITAL_COMMUNITY): Payer: Medicaid Other

## 2013-11-12 ENCOUNTER — Emergency Department (HOSPITAL_COMMUNITY)
Admission: EM | Admit: 2013-11-12 | Discharge: 2013-11-12 | Disposition: A | Discharge status: Home / Self Care | Payer: Medicaid Other | Attending: Emergency Medicine | Admitting: Emergency Medicine

## 2013-11-12 DIAGNOSIS — S8990XA Unspecified injury of unspecified lower leg, initial encounter: Secondary | ICD-10-CM | POA: Insufficient documentation

## 2013-11-12 DIAGNOSIS — J45909 Unspecified asthma, uncomplicated: Secondary | ICD-10-CM | POA: Insufficient documentation

## 2013-11-12 DIAGNOSIS — Y929 Unspecified place or not applicable: Secondary | ICD-10-CM | POA: Insufficient documentation

## 2013-11-12 DIAGNOSIS — E7800 Pure hypercholesterolemia, unspecified: Secondary | ICD-10-CM | POA: Insufficient documentation

## 2013-11-12 DIAGNOSIS — X500XXA Overexertion from strenuous movement or load, initial encounter: Secondary | ICD-10-CM | POA: Insufficient documentation

## 2013-11-12 DIAGNOSIS — M25562 Pain in left knee: Secondary | ICD-10-CM

## 2013-11-12 DIAGNOSIS — Y9389 Activity, other specified: Secondary | ICD-10-CM | POA: Insufficient documentation

## 2013-11-12 DIAGNOSIS — F17200 Nicotine dependence, unspecified, uncomplicated: Secondary | ICD-10-CM | POA: Insufficient documentation

## 2013-11-12 DIAGNOSIS — S99929A Unspecified injury of unspecified foot, initial encounter: Principal | ICD-10-CM

## 2013-11-12 DIAGNOSIS — S99919A Unspecified injury of unspecified ankle, initial encounter: Principal | ICD-10-CM

## 2013-11-12 MED ORDER — TRAMADOL HCL 50 MG PO TABS: 50.0000 mg | ORAL_TABLET | Freq: Four times a day (QID) | ORAL | Status: DC | PRN

## 2013-11-12 MED ORDER — NAPROXEN 500 MG PO TABS: 500.0000 mg | ORAL_TABLET | Freq: Two times a day (BID) | ORAL | Status: DC

## 2013-11-12 NOTE — Discharge Instructions (Signed)
Please read and follow all provided instructions.  Your diagnoses today include:  1. Knee pain, acute, left     Tests performed today include:  An x-ray of the affected area - does NOT show any broken bones  Vital signs. See below for your results today.   Medications prescribed:   Tramadol - narcotic-like pain medication  DO NOT drive or perform any activities that require you to be awake and alert because this medicine can make you drowsy.    Naproxen - anti-inflammatory pain medication  Do not exceed 500mg  naproxen every 12 hours, take with food  You have been prescribed an anti-inflammatory medication or NSAID. Take with food. Take smallest effective dose for the shortest duration needed for your pain. Stop taking if you experience stomach pain or vomiting.   Take any prescribed medications only as directed.  Home care instructions:   Follow any educational materials contained in this packet  Follow R.I.C.E. Protocol:  R - rest your injury   I  - use ice on injury without applying directly to skin  C - compress injury with bandage or splint  E - elevate the injury as much as possible  Follow-up instructions: Please follow-up with your primary care provider or the provided orthopedic physician (bone specialist) if you continue to have significant pain or trouble walking in 1 week. In this case you may have a severe injury that requires further care.   If you do not have a primary care doctor -- see below for referral information.   Return instructions:   Please return if your toes are numb or tingling, appear gray or blue, or you have severe pain (also elevate leg and loosen splint or wrap if you were given one)  Please return to the Emergency Department if you experience worsening symptoms.   Please return if you have any other emergent concerns.  Additional Information:  Your vital signs today were: BP 105/69   Pulse 82   Temp(Src) 98.1 F (36.7 C)  (Oral)   Resp 20   SpO2 100%   LMP 11/02/2013 If your blood pressure (BP) was elevated above 135/85 this visit, please have this repeated by your doctor within one month. --------------

## 2013-11-12 NOTE — ED Notes (Signed)
Pt c/o of inside right knee pain x 1 weeks, states when she squats it locks up. Denies injury. Pt c/o worsening pain, states elevation of leg makes it better.

## 2013-11-12 NOTE — ED Provider Notes (Signed)
CSN: [id number]     Arrival date & time 11/12/13  0803 History   First MD Initiated Contact with Patient 11/12/13 [phone]     Chief Complaint  Patient presents with  . Knee Pain    right   (Consider location/radiation/quality/duration/timing/severity/associated sxs/prior Treatment) HPI Comments: Patient presents with complaint of right knee pain for the past one week. She denies acute injury. She describes a locking sensation when she bends her leg and also a grinding feeling. Pain is worse over the inside of her knee. She has not taken any medicines. She is able to ambulate. No similar symptoms in the past. Onset of symptoms gradual. Course is worsening.  Patient is a 38 y.o. female presenting with knee pain. The history is provided by the patient.  Knee Pain Associated symptoms: no back pain and no neck pain     Past Medical History  Diagnosis Date  . Asthma   . Hypercholesteremia    History reviewed. No pertinent past surgical history. No family history on file. History  Substance Use Topics  . Smoking status: Current Every Day Smoker  . Smokeless tobacco: Not on file  . Alcohol Use: No   OB History   Grav Para Term Preterm Abortions TAB SAB Ect Mult Living                 Review of Systems  Constitutional: Negative for activity change.  Musculoskeletal: Positive for arthralgias. Negative for back pain, joint swelling and neck pain.  Skin: Negative for wound.  Neurological: Negative for weakness and numbness.    Allergies  Review of patient's allergies indicates no known allergies.  Home Medications   Current Outpatient Rx  Name  Route  Sig  Dispense  Refill  . azithromycin (ZITHROMAX) 250 MG tablet      Take all 4 tabs po today   4 tablet   0   . naproxen (NAPROSYN) 375 MG tablet   Oral   Take 1 tablet (375 mg total) by mouth 2 (two) times daily.   20 tablet   0   . ondansetron (ZOFRAN) 4 MG tablet   Oral   Take 1 tablet (4 mg total) by mouth every 6  (six) hours.   12 tablet   0    BP 105/69  Pulse 82  Temp(Src) 98.1 F (36.7 C) (Oral)  Resp 20  SpO2 100%  LMP 11/02/2013 Physical Exam  Nursing note and vitals reviewed. Constitutional: She appears well-developed and well-nourished.  HENT:  Head: Normocephalic and atraumatic.  Eyes: Pupils are equal, round, and reactive to light.  Neck: Normal range of motion. Neck supple.  Cardiovascular: Exam reveals no decreased pulses.   Pulses:      Dorsalis pedis pulses are 2+ on the right side, and 2+ on the left side.       Posterior tibial pulses are 2+ on the right side, and 2+ on the left side.  Musculoskeletal: She exhibits tenderness. She exhibits no edema.       Right hip: Normal.       Right knee: She exhibits normal range of motion, no swelling, no effusion and no deformity. Tenderness found. Medial joint line tenderness noted. No lateral joint line and no patellar tendon tenderness noted.       Right ankle: Normal.  Neurological: She is alert. No sensory deficit.  Motor, sensation, and vascular distal to the injury is fully intact.   Skin: Skin is warm and dry.  Psychiatric: She  has a normal mood and affect.    ED Course  Procedures (including critical care time) Labs Review Labs Reviewed - No data to display Imaging Review Dg Knee Complete 4 Views Right  11/12/2013   CLINICAL DATA:  Right knee pain.  EXAM: RIGHT KNEE - COMPLETE 4+ VIEW  COMPARISON:  None.  FINDINGS: No fracture or dislocation is noted. No joint effusion is noted. Mild narrowing of medial joint space is noted with osteophyte formation. No soft tissue abnormality is noted.  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 No acute abnormality seen in the right knee.   Electronically Signed   By: Sabino Dick M.D.   On: 11/12/2013 09:32    EKG Interpretation   None      8:46 AM Patient seen and examined. X-ray ordered. Suspect meniscus injury.    Vital signs reviewed and are as  follows: Filed Vitals:   11/12/13 0809  BP: 105/69  Pulse: 82  Temp: 98.1 F (36.7 C)  Resp: 20   X-ray negative. Patient informed. X-ray reviewed by myself.  Patient encouraged to follow up with orthopedist, referral given.   Patient was counseled on RICE protocol and told to rest injury, use ice for no longer than 15 minutes every hour, compress the area, and elevate above the level of their heart as much as possible to reduce swelling.  Questions answered.  Patient verbalized understanding.    Patient counseled on use of narcotic pain medications. Counseled not to combine these medications with others containing tylenol. Urged not to drink alcohol, drive, or perform any other activities that requires focus while taking these medications. The patient verbalizes understanding and agrees with the plan.  MDM   1. Knee pain, acute, left    Daily and, likely meniscal injury given this presentation. X-ray shows mild arthritis but this would not explain grinding and clicking sensation patient hasn't knee. Will have patient use NSAIDs, rice protocol, follow up with orthopedics. Lower extremity is neurovascularly intact, no evidence of vascular compromise.   Carlisle Cater, PA-C 11/12/13 [phone]

## 2013-11-14 NOTE — ED Provider Notes (Signed)
Medical screening examination/treatment/procedure(s) were performed by non-physician practitioner and as supervising physician I was immediately available for consultation/collaboration.  EKG Interpretation   None        Varney Biles, MD 11/14/13 2128

## 2014-05-31 ENCOUNTER — Emergency Department (HOSPITAL_COMMUNITY)
Admission: EM | Admit: 2014-05-31 | Discharge: 2014-05-31 | Disposition: A | Discharge status: Home / Self Care | Payer: Medicaid Other | Attending: Emergency Medicine | Admitting: Emergency Medicine

## 2014-05-31 ENCOUNTER — Encounter (HOSPITAL_COMMUNITY): Payer: Self-pay | Admitting: Emergency Medicine

## 2014-05-31 DIAGNOSIS — F17200 Nicotine dependence, unspecified, uncomplicated: Secondary | ICD-10-CM | POA: Insufficient documentation

## 2014-05-31 DIAGNOSIS — Z862 Personal history of diseases of the blood and blood-forming organs and certain disorders involving the immune mechanism: Secondary | ICD-10-CM | POA: Insufficient documentation

## 2014-05-31 DIAGNOSIS — T6391XA Toxic effect of contact with unspecified venomous animal, accidental (unintentional), initial encounter: Secondary | ICD-10-CM | POA: Insufficient documentation

## 2014-05-31 DIAGNOSIS — Y92009 Unspecified place in unspecified non-institutional (private) residence as the place of occurrence of the external cause: Secondary | ICD-10-CM | POA: Insufficient documentation

## 2014-05-31 DIAGNOSIS — T63461A Toxic effect of venom of wasps, accidental (unintentional), initial encounter: Secondary | ICD-10-CM | POA: Insufficient documentation

## 2014-05-31 DIAGNOSIS — Z8639 Personal history of other endocrine, nutritional and metabolic disease: Secondary | ICD-10-CM | POA: Insufficient documentation

## 2014-05-31 DIAGNOSIS — T63444A Toxic effect of venom of bees, undetermined, initial encounter: Secondary | ICD-10-CM

## 2014-05-31 DIAGNOSIS — J45909 Unspecified asthma, uncomplicated: Secondary | ICD-10-CM | POA: Insufficient documentation

## 2014-05-31 DIAGNOSIS — Y9301 Activity, walking, marching and hiking: Secondary | ICD-10-CM | POA: Insufficient documentation

## 2014-05-31 MED ORDER — SODIUM CHLORIDE 0.9 % IV BOLUS (SEPSIS)
1000.0000 mL | Freq: Once | INTRAVENOUS | Status: AC
  Administered 2014-05-31: 1000 mL via INTRAVENOUS

## 2014-05-31 MED ORDER — HYDROCODONE-ACETAMINOPHEN 5-325 MG PO TABS: ORAL_TABLET | ORAL | Status: DC

## 2014-05-31 MED ORDER — MORPHINE SULFATE 4 MG/ML IJ SOLN
4.0000 mg | Freq: Once | INTRAMUSCULAR | Status: AC
  Administered 2014-05-31: 4 mg via INTRAVENOUS
  Filled 2014-05-31: qty 1

## 2014-05-31 MED ORDER — DEXAMETHASONE SODIUM PHOSPHATE 10 MG/ML IJ SOLN
10.0000 mg | Freq: Once | INTRAMUSCULAR | Status: AC
  Administered 2014-05-31: 10 mg via INTRAVENOUS
  Filled 2014-05-31: qty 1

## 2014-05-31 MED ORDER — DIPHENHYDRAMINE HCL 50 MG/ML IJ SOLN
50.0000 mg | Freq: Once | INTRAMUSCULAR | Status: AC
Start: 2014-05-31 — End: 2014-05-31
  Administered 2014-05-31: 50 mg via INTRAVENOUS
  Filled 2014-05-31: qty 1

## 2014-05-31 MED ORDER — FAMOTIDINE IN NACL 20-0.9 MG/50ML-% IV SOLN
20.0000 mg | Freq: Once | INTRAVENOUS | Status: AC
  Administered 2014-05-31: 20 mg via INTRAVENOUS
  Filled 2014-05-31: qty 50

## 2014-05-31 MED ORDER — FAMOTIDINE 20 MG PO TABS: 20.0000 mg | ORAL_TABLET | Freq: Two times a day (BID) | ORAL | Status: DC | PRN

## 2014-05-31 MED ORDER — DIPHENHYDRAMINE HCL 25 MG PO TABS: 50.0000 mg | ORAL_TABLET | ORAL | Status: DC | PRN

## 2014-05-31 NOTE — ED Notes (Signed)
Pt states that she stepped out of her car into a bee nest about 20 mins ago.  Pt states that she was stung several times all over.  Pt states that she is not allergic to bees and denies any rash or SOB.  Just c/o pain at sites.

## 2014-05-31 NOTE — ED Provider Notes (Signed)
CSN: [id number]     Arrival date & time 05/31/14  1031 History   First MD Initiated Contact with Patient 05/31/14 1047     Chief Complaint  Patient presents with  . Insect Bite     (Consider location/radiation/quality/duration/timing/severity/associated sxs/prior Treatment) HPI  Wendy Ross is a 38 y.o. female complaining of multiple bee stings occurring at 10:30 AM this morning when she was walking in her yard and feels that she may have stepped on a mass. Patient denies any  swelling, shortness of breath, wheezing, nausea, dyspepsia. States that the stings burn severely. No pain medication prior to arrival.  Past Medical History  Diagnosis Date  . Asthma   . Hypercholesteremia    No past surgical history on file. No family history on file. History  Substance Use Topics  . Smoking status: Current Every Day Smoker  . Smokeless tobacco: Not on file  . Alcohol Use: No   OB History   Grav Para Term Preterm Abortions TAB SAB Ect Mult Living                 Review of Systems  10 systems reviewed and found to be negative, except as noted in the HPI.   Allergies  Review of patient's allergies indicates no known allergies.  Home Medications   Prior to Admission medications   Medication Sig Start Date End Date Taking? Authorizing Provider  ibuprofen (ADVIL,MOTRIN) 200 MG tablet Take 200 mg by mouth every 6 (six) hours as needed for moderate pain.   Yes Historical Provider, MD  diphenhydrAMINE (BENADRYL) 25 MG tablet Take 2 tablets (50 mg total) by mouth every 4 (four) hours as needed for itching. 05/31/14   Elmyra Ricks Taijah Macrae, PA-C  famotidine (PEPCID) 20 MG tablet Take 1 tablet (20 mg total) by mouth 2 (two) times daily as needed (itching). 05/31/14   Tashawna Thom, PA-C  HYDROcodone-acetaminophen (NORCO/VICODIN) 5-325 MG per tablet Take 1-2 tablets by mouth every 6 hours as needed for pain. 05/31/14   Ashmi Blas, PA-C   BP 121/83  Pulse 55  Temp(Src) 98.3 F (36.8  C) (Oral)  Resp 20  SpO2 100% Physical Exam  Nursing note and vitals reviewed. Constitutional: She is oriented to person, place, and time. She appears well-developed and well-nourished. No distress.  HENT:  Head: Normocephalic and atraumatic.  Mouth/Throat: Oropharynx is clear and moist.  Eyes: Conjunctivae and EOM are normal. Pupils are equal, round, and reactive to light.  Neck: Normal range of motion. Neck supple.  Cardiovascular: Normal rate, regular rhythm and intact distal pulses.   Pulmonary/Chest: Effort normal and breath sounds normal. No stridor. No respiratory distress. She has no wheezes. She has no rales. She exhibits no tenderness.  Abdominal: Soft. Bowel sounds are normal. She exhibits no distension and no mass. There is no tenderness. There is no rebound and no guarding.  Musculoskeletal: Normal range of motion.  Neurological: She is alert and oriented to person, place, and time.  Skin: Rash noted.  Mild erythema and trace swelling to have centimeter lesions scattered over upper and lower extremities. No puncture marks or stingers visualized  Psychiatric: She has a normal mood and affect.    ED Course  Procedures (including critical care time) Labs Review Labs Reviewed - No data to display  Imaging Review No results found.   EKG Interpretation None      MDM   Final diagnoses:  Bee sting, undetermined intent, initial encounter    Filed Vitals:   05/31/14  1039 05/31/14 1229 05/31/14 1252  BP: 114/84 100/64 121/83  Pulse: 105 65 55  Temp: 97.2 F (36.2 C) 98.3 F (36.8 C)   TempSrc: Oral Oral   Resp: 16 20 20   SpO2: 100% 100% 100%    Medications  sodium chloride 0.9 % bolus 1,000 mL (1,000 mLs Intravenous New Bag/Given 05/31/14 1157)  diphenhydrAMINE (BENADRYL) injection 50 mg (50 mg Intravenous Given 05/31/14 1157)  famotidine (PEPCID) IVPB 20 mg (0 mg Intravenous Stopped 05/31/14 1241)  dexamethasone (DECADRON) injection 10 mg (10 mg Intravenous  Given 05/31/14 1157)  morphine 4 MG/ML injection 4 mg (4 mg Intravenous Given 05/31/14 1239)    Wendy Ross is a 38 y.o. female presenting with painful bee stings. No sign of anaphylactic reaction. Patient given Benadryl Pepcid and Decadron with little relief. Morphine given and patient McClain home with symptomatic prescriptions. Return precautions for infection.  Evaluation does not show pathology that would require ongoing emergent intervention or inpatient treatment. Pt is hemodynamically stable and mentating appropriately. Discussed findings and plan with patient/guardian, who agrees with care plan. All questions answered. Return precautions discussed and outpatient follow up given.   Discharge Medication List as of 05/31/2014 12:29 PM    START taking these medications   Details  diphenhydrAMINE (BENADRYL) 25 MG tablet Take 2 tablets (50 mg total) by mouth every 4 (four) hours as needed for itching., Starting 05/31/2014, Until Discontinued, Print    famotidine (PEPCID) 20 MG tablet Take 1 tablet (20 mg total) by mouth 2 (two) times daily as needed (itching)., Starting 05/31/2014, Until Discontinued, Print    HYDROcodone-acetaminophen (NORCO/VICODIN) 5-325 MG per tablet Take 1-2 tablets by mouth every 6 hours as needed for pain., Print             Monico Blitz, PA-C 05/31/14 1304

## 2014-05-31 NOTE — Discharge Instructions (Signed)
Take vicodin for breakthrough pain, do not drink alcohol, drive, care for children or do other critical tasks while taking vicodin.  If you see signs of infection (warmth, redness, tenderness, pus, sharp increase in pain, fever, red streaking) immediately return to the emergency department.   Bee, Wasp, or Hornet Sting Your caregiver has diagnosed you as having an insect sting. An insect sting appears as a red lump in the skin that sometimes has a tiny hole in the center, or it may have a stinger in the center of the wound. The most common stings are from wasps, hornets and bees. Individuals have different reactions to insect stings.  A normal reaction may cause pain, swelling, and redness around the sting site.  A localized allergic reaction may cause swelling and redness that extends beyond the sting site.  A large local reaction may continue to develop over the next 12 to 36 hours.  On occasion, the reactions can be severe (anaphylactic reaction). An anaphylactic reaction may cause wheezing; difficulty breathing; chest pain; fainting; raised, itchy, red patches on the skin; a sick feeling to your stomach (nausea); vomiting; cramping; or diarrhea. If you have had an anaphylactic reaction to an insect sting in the past, you are more likely to have one again. HOME CARE INSTRUCTIONS   With bee stings, a small sac of poison is left in the wound. Brushing across this with something such as a credit card, or anything similar, will help remove this and decrease the amount of the reaction. This same procedure will not help a wasp sting as they do not leave behind a stinger and poison sac.  Apply a cold compress for 10 to 20 minutes every hour for 1 to 2 days, depending on severity, to reduce swelling and itching.  To lessen pain, a paste made of water and baking soda may be rubbed on the bite or sting and left on for 5 minutes.  To relieve itching and swelling, you may use take medication or apply  medicated creams or lotions as directed.  Only take over-the-counter or prescription medicines for pain, discomfort, or fever as directed by your caregiver.  Wash the sting site daily with soap and water. Apply antibiotic ointment on the sting site as directed.  If you suffered a severe reaction:  If you did not require hospitalization, an adult will need to stay with you for 24 hours in case the symptoms return.  You may need to wear a medical bracelet or necklace stating the allergy.  You and your family need to learn when and how to use an anaphylaxis kit or epinephrine injection.  If you have had a severe reaction before, always carry your anaphylaxis kit with you. SEEK MEDICAL CARE IF:   None of the above helps within 2 to 3 days.  The area becomes red, warm, tender, and swollen beyond the area of the bite or sting.  You have an oral temperature above 102 F (38.9 C). SEEK IMMEDIATE MEDICAL CARE IF:  You have symptoms of an allergic reaction which are:  Wheezing.  Difficulty breathing.  Chest pain.  Lightheadedness or fainting.  Itchy, raised, red patches on the skin.  Nausea, vomiting, cramping or diarrhea. ANY OF THESE SYMPTOMS MAY REPRESENT A SERIOUS PROBLEM THAT IS AN EMERGENCY. Do not wait to see if the symptoms will go away. Get medical help right away. Call your local emergency services (911 in [place] DO NOT drive yourself to the hospital. MAKE SURE YOU:  Understand these instructions.  Will watch your condition.  Will get help right away if you are not doing well or get worse. Document Released: 10/24/2005 Document Revised: 01/16/2012 Document Reviewed: 04/10/2010 [facility] Patient Information 2015 [place], [place]. This information is not intended to replace advice given to you by your health care provider. Make sure you discuss any questions you have with your health care provider.

## 2014-06-01 NOTE — ED Provider Notes (Signed)
Medical screening examination/treatment/procedure(s) were performed by non-physician practitioner and as supervising physician I was immediately available for consultation/collaboration.    Dorie Rank, MD 06/01/14 0800

## 2014-06-17 ENCOUNTER — Encounter (HOSPITAL_COMMUNITY): Payer: Self-pay | Admitting: Emergency Medicine

## 2014-06-17 ENCOUNTER — Emergency Department (HOSPITAL_COMMUNITY)
Admission: EM | Admit: 2014-06-17 | Discharge: 2014-06-17 | Disposition: A | Discharge status: Home / Self Care | Payer: Medicaid Other | Attending: Emergency Medicine | Admitting: Emergency Medicine

## 2014-06-17 DIAGNOSIS — J45909 Unspecified asthma, uncomplicated: Secondary | ICD-10-CM | POA: Insufficient documentation

## 2014-06-17 DIAGNOSIS — S0993XA Unspecified injury of face, initial encounter: Secondary | ICD-10-CM | POA: Insufficient documentation

## 2014-06-17 DIAGNOSIS — F17200 Nicotine dependence, unspecified, uncomplicated: Secondary | ICD-10-CM | POA: Insufficient documentation

## 2014-06-17 DIAGNOSIS — Y9389 Activity, other specified: Secondary | ICD-10-CM | POA: Insufficient documentation

## 2014-06-17 DIAGNOSIS — S0450XA Injury of facial nerve, unspecified side, initial encounter: Secondary | ICD-10-CM | POA: Insufficient documentation

## 2014-06-17 DIAGNOSIS — S0452XA Injury of facial nerve, left side, initial encounter: Secondary | ICD-10-CM

## 2014-06-17 DIAGNOSIS — Z862 Personal history of diseases of the blood and blood-forming organs and certain disorders involving the immune mechanism: Secondary | ICD-10-CM | POA: Insufficient documentation

## 2014-06-17 DIAGNOSIS — S139XXA Sprain of joints and ligaments of unspecified parts of neck, initial encounter: Secondary | ICD-10-CM | POA: Insufficient documentation

## 2014-06-17 DIAGNOSIS — S161XXA Strain of muscle, fascia and tendon at neck level, initial encounter: Secondary | ICD-10-CM

## 2014-06-17 DIAGNOSIS — Y92410 Unspecified street and highway as the place of occurrence of the external cause: Secondary | ICD-10-CM | POA: Insufficient documentation

## 2014-06-17 DIAGNOSIS — S199XXA Unspecified injury of neck, initial encounter: Secondary | ICD-10-CM

## 2014-06-17 DIAGNOSIS — Z8639 Personal history of other endocrine, nutritional and metabolic disease: Secondary | ICD-10-CM | POA: Insufficient documentation

## 2014-06-17 MED ORDER — IBUPROFEN 800 MG PO TABS
800.0000 mg | ORAL_TABLET | Freq: Once | ORAL | Status: AC
Start: 2014-06-17 — End: 2014-06-17
  Administered 2014-06-17: 800 mg via ORAL
  Filled 2014-06-17: qty 1

## 2014-06-17 MED ORDER — IBUPROFEN 800 MG PO TABS: 800.0000 mg | ORAL_TABLET | Freq: Three times a day (TID) | ORAL | Status: DC

## 2014-06-17 MED ORDER — METHOCARBAMOL 500 MG PO TABS: 500.0000 mg | ORAL_TABLET | Freq: Two times a day (BID) | ORAL | Status: DC
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
## 2014-06-17 NOTE — Discharge Instructions (Signed)
1. Medications: robaxin, usual home medications 2. Treatment: rest, drink plenty of fluids,  3. Follow Up: Please followup with your primary doctor for discussion of your diagnoses and further evaluation after today's visit; if you do not have a primary care doctor use the resource guide provided to find one;    Cervical Sprain A cervical sprain is an injury in the neck in which the strong, fibrous tissues (ligaments) that connect your neck bones stretch or tear. Cervical sprains can range from mild to severe. Severe cervical sprains can cause the neck vertebrae to be unstable. This can lead to damage of the spinal cord and can result in serious nervous system problems. The amount of time it takes for a cervical sprain to get better depends on the cause and extent of the injury. Most cervical sprains heal in 1 to 3 weeks. CAUSES  Severe cervical sprains may be caused by:   Contact sport injuries (such as from football, rugby, wrestling, hockey, auto racing, gymnastics, diving, martial arts, or boxing).   Motor vehicle collisions.   Whiplash injuries. This is an injury from a sudden forward and backward whipping movement of the head and neck.  Falls.  Mild cervical sprains may be caused by:   Being in an awkward position, such as while cradling a telephone between your ear and shoulder.   Sitting in a chair that does not offer proper support.   Working at a poorly Landscape [employer] station.   Looking up or down for long periods of time.  SYMPTOMS   Pain, soreness, stiffness, or a burning sensation in the front, back, or sides of the neck. This discomfort may develop immediately after the injury or slowly, 24 hours or more after the injury.   Pain or tenderness directly in the middle of the back of the neck.   Shoulder or upper back pain.   Limited ability to move the neck.   Headache.   Dizziness.   Weakness, numbness, or tingling in the hands or arms.   Muscle  spasms.   Difficulty swallowing or chewing.   Tenderness and swelling of the neck.  DIAGNOSIS  Most of the time your health care provider can diagnose a cervical sprain by taking your history and doing a physical exam. Your health care provider will ask about previous neck injuries and any known neck problems, such as arthritis in the neck. X-rays may be taken to find out if there are any other problems, such as with the bones of the neck. Other tests, such as a CT scan or MRI, may also be needed.  TREATMENT  Treatment depends on the severity of the cervical sprain. Mild sprains can be treated with rest, keeping the neck in place (immobilization), and pain medicines. Severe cervical sprains are immediately immobilized. Further treatment is done to help with pain, muscle spasms, and other symptoms and may include:  Medicines, such as pain relievers, numbing medicines, or muscle relaxants.   Physical therapy. This may involve stretching exercises, strengthening exercises, and posture training. Exercises and improved posture can help stabilize the neck, strengthen muscles, and help stop symptoms from returning.  HOME CARE INSTRUCTIONS   Put ice on the injured area.   Put ice in a plastic bag.   Place a towel between your skin and the bag.   Leave the ice on for 15-20 minutes, 3-4 times a day.   If your injury was severe, you may have been given a cervical collar to wear. A cervical  collar is a two-piece collar designed to keep your neck from moving while it heals.  Do not remove the collar unless instructed by your health care provider.  If you have long hair, keep it outside of the collar.  Ask your health care provider before making any adjustments to your collar. Minor adjustments may be required over time to improve comfort and reduce pressure on your chin or on the back of your head.  Ifyou are allowed to remove the collar for cleaning or bathing, follow your health care  provider's instructions on how to do so safely.  Keep your collar clean by wiping it with mild soap and water and drying it completely. If the collar you have been given includes removable pads, remove them every 1-2 days and hand wash them with soap and water. Allow them to air dry. They should be completely dry before you wear them in the collar.  If you are allowed to remove the collar for cleaning and bathing, wash and dry the skin of your neck. Check your skin for irritation or sores. If you see any, tell your health care provider.  Do not drive while wearing the collar.   Only take over-the-counter or prescription medicines for pain, discomfort, or fever as directed by your health care provider.   Keep all follow-up appointments as directed by your health care provider.   Keep all physical therapy appointments as directed by your health care provider.   Make any needed adjustments to your workstation to promote good posture.   Avoid positions and activities that make your symptoms worse.   Warm up and stretch before being active to help prevent problems.  SEEK MEDICAL CARE IF:   Your pain is not controlled with medicine.   You are unable to decrease your pain medicine over time as planned.   Your activity level is not improving as expected.  SEEK IMMEDIATE MEDICAL CARE IF:   You develop any bleeding.  You develop stomach upset.  You have signs of an allergic reaction to your medicine.   Your symptoms get worse.   You develop new, unexplained symptoms.   You have numbness, tingling, weakness, or paralysis in any part of your body.  MAKE SURE YOU:   Understand these instructions.  Will watch your condition.  Will get help right away if you are not doing well or get worse. Document Released: 08/21/2007 Document Revised: 10/29/2013 Document Reviewed: 05/01/2013 [facility] Patient Information 2015 [place], [place]. This information is not intended to replace  advice given to you by your health care provider. Make sure you discuss any questions you have with your health care provider.
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
## 2014-06-17 NOTE — Progress Notes (Signed)
[place],  Did not get to see patient but will be sending information about [facility] to help patient establish primary care, using the address provided.
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
## 2014-06-18 NOTE — ED Provider Notes (Signed)
Medical screening examination/treatment/procedure(s) were performed by non-physician practitioner and as supervising physician I was immediately available for consultation/collaboration.  Leota Jacobsen, MD 06/18/14 [phone]

## 2015-02-05 ENCOUNTER — Encounter (HOSPITAL_COMMUNITY): Payer: Self-pay | Admitting: *Deleted

## 2015-02-05 ENCOUNTER — Inpatient Hospital Stay (HOSPITAL_COMMUNITY)
Admission: AD | Admit: 2015-02-05 | Discharge: 2015-02-05 | Disposition: A | Discharge status: Home / Self Care | Payer: Self-pay | Source: Ambulatory Visit | Attending: Obstetrics & Gynecology | Admitting: Obstetrics & Gynecology

## 2015-02-05 DIAGNOSIS — B9689 Other specified bacterial agents as the cause of diseases classified elsewhere: Secondary | ICD-10-CM | POA: Insufficient documentation

## 2015-02-05 DIAGNOSIS — A499 Bacterial infection, unspecified: Secondary | ICD-10-CM

## 2015-02-05 DIAGNOSIS — N760 Acute vaginitis: Secondary | ICD-10-CM | POA: Insufficient documentation

## 2015-02-05 DIAGNOSIS — N6012 Diffuse cystic mastopathy of left breast: Secondary | ICD-10-CM | POA: Insufficient documentation

## 2015-02-05 DIAGNOSIS — N644 Mastodynia: Secondary | ICD-10-CM | POA: Insufficient documentation

## 2015-02-05 DIAGNOSIS — F17210 Nicotine dependence, cigarettes, uncomplicated: Secondary | ICD-10-CM | POA: Insufficient documentation

## 2015-02-05 HISTORY — DX: Unspecified abnormal cytological findings in specimens from vagina: R87.629

## 2015-02-05 LAB — URINALYSIS, ROUTINE W REFLEX MICROSCOPIC
BILIRUBIN URINE: NEGATIVE
GLUCOSE, UA: NEGATIVE mg/dL
Hgb urine dipstick: NEGATIVE
Ketones, ur: NEGATIVE mg/dL
LEUKOCYTES UA: NEGATIVE
Nitrite: NEGATIVE
Protein, ur: NEGATIVE mg/dL
Specific Gravity, Urine: 1.015 (ref 1.005–1.030)
Urobilinogen, UA: 2 mg/dL — ABNORMAL HIGH (ref 0.0–1.0)
pH: 7.5 (ref 5.0–8.0)

## 2015-02-05 LAB — WET PREP, GENITAL
Trich, Wet Prep: NONE SEEN
WBC, Wet Prep HPF POC: NONE SEEN
Yeast Wet Prep HPF POC: NONE SEEN

## 2015-02-05 LAB — POCT PREGNANCY, URINE: Preg Test, Ur: NEGATIVE

## 2015-02-05 MED ORDER — METRONIDAZOLE 500 MG PO TABS: 500.0000 mg | ORAL_TABLET | Freq: Two times a day (BID) | ORAL | Status: DC

## 2015-02-05 NOTE — Discharge Instructions (Signed)
Bacterial Vaginosis Bacterial vaginosis is a vaginal infection that occurs when the normal balance of bacteria in the vagina is disrupted. It results from an overgrowth of certain bacteria. This is the most common vaginal infection in women of childbearing age. Treatment is important to prevent complications, especially in pregnant women, as it can cause a premature delivery. CAUSES  Bacterial vaginosis is caused by an increase in harmful bacteria that are normally present in smaller amounts in the vagina. Several different kinds of bacteria can cause bacterial vaginosis. However, the reason that the condition develops is not fully understood. RISK FACTORS Certain activities or behaviors can put you at an increased risk of developing bacterial vaginosis, including:  Having a new sex partner or multiple sex partners.  Douching.  Using an intrauterine device (IUD) for contraception. Women do not get bacterial vaginosis from toilet seats, bedding, swimming pools, or contact with objects around them. SIGNS AND SYMPTOMS  Some women with bacterial vaginosis have no signs or symptoms. Common symptoms include:  Grey vaginal discharge.  A fishlike odor with discharge, especially after sexual intercourse.  Itching or burning of the vagina and vulva.  Burning or pain with urination. DIAGNOSIS  Your health care provider will take a medical history and examine the vagina for signs of bacterial vaginosis. A sample of vaginal fluid may be taken. Your health care provider will look at this sample under a microscope to check for bacteria and abnormal cells. A vaginal pH test may also be done.  TREATMENT  Bacterial vaginosis may be treated with antibiotic medicines. These may be given in the form of a pill or a vaginal cream. A second round of antibiotics may be prescribed if the condition comes back after treatment.  HOME CARE INSTRUCTIONS   Only take over-the-counter or prescription medicines as  directed by your health care provider.  If antibiotic medicine was prescribed, take it as directed. Make sure you finish it even if you start to feel better.  Do not have sex until treatment is completed.  Tell all sexual partners that you have a vaginal infection. They should see their health care provider and be treated if they have problems, such as a mild rash or itching.  Practice safe sex by using condoms and only having one sex partner. SEEK MEDICAL CARE IF:   Your symptoms are not improving after 3 days of treatment.  You have increased discharge or pain.  You have a fever. MAKE SURE YOU:   Understand these instructions.  Will watch your condition.  Will get help right away if you are not doing well or get worse. FOR MORE INFORMATION  Centers for Disease Control and Prevention, Division of STD Prevention: [employer]raiser[employer]raud[employer] American Sexual Health Association (ASHA): www.ashastd.org  Document Released: 10/24/2005 Document Revised: 08/14/2013 Document Reviewed: 06/05/2013 [facility] Patient Information 2015 [place], [place]. This information is not intended to replace advice given to you by your health care provider. Make sure you discuss any questions you have with your health care provider. Fibrocystic Breast Changes Fibrocystic breast changes occur when breast ducts become blocked, causing painful, fluid-filled lumps (cysts) to form in the breast. This is a common condition that is noncancerous (benign). It occurs when women go through hormonal changes during their menstrual cycle. Fibrocystic breast changes can affect one or both breasts. CAUSES  The exact cause of fibrocystic breast changes is not known, but it may be related to the female hormones estrogen and progesterone. Family traits that get passed from parent to child (  genetics) may also be a factor in some cases. SIGNS AND SYMPTOMS   Tenderness, mild discomfort, or pain.   Swelling.   Ropelike feeling when  touching the breast.   Lumpy breast, one or both sides.   Changes in breast size, especially before (larger) and after (smaller) the menstrual period.   Green or dark brown nipple discharge (not blood).  Symptoms are usually worse before menstrual periods start and get better toward the end of the menstrual period.  DIAGNOSIS  To make a diagnosis, your health care provider will ask you questions and perform a physical exam of your breasts. The health care provider may recommend other tests that can examine inside your breasts, such as:  A breast X-ray (mammogram).   Ultrasonography.  An MRI.  If something more than fibrocystic breast changes is suspected, your health care provider may take a breast tissue sample (breast biopsy) to examine. TREATMENT  Often, treatment is not needed. Your health care provider may recommend over-the-counter pain relievers to help lessen pain or discomfort caused by the fibrocystic breast changes. You may also be asked to change your diet to limit or stop eating foods or drinking beverages that contain caffeine. Foods and beverages that contain caffeine include chocolate, soda, coffee, and tea. Reducing sugar and fat in your diet may also help. Your health care provider may also recommend:  Fine needle aspiration to remove fluid from a cyst that is causing pain.   Surgery to remove a large, persistent, and tender cyst. HOME CARE INSTRUCTIONS   Examine your breasts after every menstrual period. If you do not have menstrual periods, check your breasts the first day of every month. Feel for changes, such as more tenderness, a new growth, a change in breast size, or a change in a lump that has always been there.   Only take over-the-counter or prescription medicine as directed by your health care provider.   Wear a well-fitted support or sports bra, especially when exercising.   Decrease or avoid caffeine, fat, and sugar in your diet as directed by  your health care provider.  SEEK MEDICAL CARE IF:   You have fluid leaking (discharge) from your nipples, especially bloody discharge.   You have new lumps or bumps in the breast.   Your breast or breasts become enlarged, red, and painful.   You have areas of your breast that pucker in.   Your nipples appear flat or indented.  Document Released: 08/10/2006 Document Revised: 10/29/2013 Document Reviewed: 04/14/2013 [facility] Patient Information 2015 [place], [place]. This information is not intended to replace advice given to you by your health care provider. Make sure you discuss any questions you have with your health care provider.  Prenatal Care/OB [facility]    [facility]  [facility]  [phone]     Phone: [place]                      [facility]  @[place]     Phone: [phone]  Phone: [place]     Phone: [phone]  Phone: [phone]           [place] [facility] [place]  hone: [phone]  Phone: [phone]         [facility] [address]      Phone: [phone]  Phone: [phone]         [place] [facility]                Phone: [phone]  [place])          Phone: [phone] [facility]   Phone: [phone]

## 2015-02-05 NOTE — MAU Note (Addendum)
L breast pain for the last month, unsure if lump.  White discharge 2 weeks ago, treated herself with monistat.  Still has discharge but is clear & light cream color.  No odor or itching.  Also has urinary frequency.

## 2015-02-05 NOTE — MAU Provider Note (Signed)
Chief Complaint: Breast Pain; Vaginal Discharge; and Urinary Frequency   First Provider Initiated Contact with Patient 02/05/15 1131      SUBJECTIVE HPI: Wendy Ross is a 39 y.o. [id number] who presents to maternity admissions reporting vaginal discharge x 1 week, left breast pain x 1 month, and urinary frequency x 2-3 weeks.  She denies family hx of breast cancer but reports she has never had this pain before and she is worried.  She treated her vaginal discharge 1 week ago with OTC yeast infection medication, a 1 day topical treatment, but her symptoms returned.  She reports an increase in caffeine lately, drinking 2 liters of Pepsi per day.  She denies vaginal bleeding, vaginal itching/burning, h/a, dizziness, n/v, or fever/chills.    Vaginal Discharge The patient's primary symptoms include vaginal discharge. This is a new problem. The current episode started in the past 7 days. The problem occurs constantly. The problem has been unchanged. The patient is experiencing no pain. She is not pregnant. Associated symptoms include frequency. The vaginal discharge was thick and white. There has been no bleeding. She has not been passing clots. She has not been passing tissue. Nothing aggravates the symptoms. She has tried antifungals for the symptoms. The treatment provided mild relief. She is sexually active. No, her partner does not have an STD. Her menstrual history has been regular.  Urinary Frequency  This is a new problem. The current episode started 1 to 4 weeks ago. The problem occurs intermittently. The problem has been gradually worsening. The patient is experiencing no pain. There has been no fever. She is sexually active. There is no history of pyelonephritis. Associated symptoms include frequency. She has tried nothing for the symptoms.  Breast Pain New onset x 1 month, the problem is constant and worsening. The pain is sharp and sore, radiating into left axilla.  She has tried ibuprofen,  heating pad, and ice with minimal relief.  Past Medical History  Diagnosis Date  . Asthma   . Hypercholesteremia   . Vaginal Pap smear, abnormal    Past Surgical History  Procedure Laterality Date  . Tubal ligation     History   Social History  . Marital Status: Single    Spouse Name: N/A  . Number of Children: N/A  . Years of Education: N/A   Occupational History  . Not on file.   Social History Main Topics  . Smoking status: Current Every Day Smoker -- 0.50 packs/day    Types: Cigarettes  . Smokeless tobacco: Not on file  . Alcohol Use: Yes     Comment: social  . Drug Use: No  . Sexual Activity: Yes    Birth Control/ Protection: Surgical   Other Topics Concern  . Not on file   Social History Narrative   No current facility-administered medications on file prior to encounter.   Current Outpatient Prescriptions on File Prior to Encounter  Medication Sig Dispense Refill  . diphenhydrAMINE (BENADRYL) 25 MG tablet Take 2 tablets (50 mg total) by mouth every 4 (four) hours as needed for itching. 20 tablet 0  . ibuprofen (ADVIL,MOTRIN) 200 MG tablet Take 800 mg by mouth every 6 (six) hours as needed for moderate pain.      No Known Allergies  ROS: Pertinent items in HPI  OBJECTIVE Blood pressure 100/66, pulse 80, temperature 98 F (36.7 C), temperature source Oral, resp. rate 18, height 5\' 4"  (1.626 m), weight 70.126 kg (154 lb 9.6 oz), last menstrual period  01/15/2015. GENERAL: Well-developed, well-nourished female in no acute distress.  HEENT: Normocephalic HEART: normal rate RESP: normal effort BREAST: left breast with small, smooth, round, mobile masses throughout and ropelike mobile masses into outer corner of breast under axillary region, significant tenderness to palpation ABDOMEN: Soft, non-tender EXTREMITIES: Nontender, no edema NEURO: Alert and oriented Pelvic exam: Cervix pink, visually closed, without lesion, scant thin white/grey discharge, vaginal  walls and external genitalia normal Bimanual exam: Cervix 0/long/high, firm, anterior, neg CMT, uterus nontender, nonenlarged, adnexa without tenderness, enlargement, or mass  LAB RESULTS Results for orders placed or performed during the hospital encounter of 02/05/15 (from the past 24 hour(s))  Urinalysis, Routine w reflex microscopic     Status: Abnormal   Collection Time: 02/05/15  9:30 AM  Result Value Ref Range   Color, Urine YELLOW YELLOW   APPearance CLEAR CLEAR   Specific Gravity, Urine 1.015 1.005 - 1.030   pH 7.5 5.0 - 8.0   Glucose, UA NEGATIVE NEGATIVE mg/dL   Hgb urine dipstick NEGATIVE NEGATIVE   Bilirubin Urine NEGATIVE NEGATIVE   Ketones, ur NEGATIVE NEGATIVE mg/dL   Protein, ur NEGATIVE NEGATIVE mg/dL   Urobilinogen, UA 2.0 (H) 0.0 - 1.0 mg/dL   Nitrite NEGATIVE NEGATIVE   Leukocytes, UA NEGATIVE NEGATIVE  Pregnancy, urine POC     Status: None   Collection Time: 02/05/15  9:34 AM  Result Value Ref Range   Preg Test, Ur NEGATIVE NEGATIVE  Wet prep, genital     Status: Abnormal   Collection Time: 02/05/15 11:45 AM  Result Value Ref Range   Yeast Wet Prep HPF POC NONE SEEN NONE SEEN   Trich, Wet Prep NONE SEEN NONE SEEN   Clue Cells Wet Prep HPF POC FEW (A) NONE SEEN   WBC, Wet Prep HPF POC NONE SEEN NONE SEEN  HIV antibody     Status: None   Collection Time: 02/05/15 11:58 AM  Result Value Ref Range   HIV Screen 4th Generation wRfx Non Reactive Non Reactive    IMAGING No results found.  ASSESSMENT 1. BV (bacterial vaginosis)   2. Fibrocystic breast changes, left   3. Breast pain, left     PLAN Discharge home Referral to [place] for ultrasound Flagyl 500 mg BID x 7 days   Follow-up Information    Follow up with [facility].   Specialty:  Diagnostic Radiology   Why:  [facility] will call you with appointment or call the number listed below.    Contact information:   [place]  [place] [address] [phone]       Follow up with [place].   Why:  As needed for emergencies   Contact information:   [address] [id number] [place] [phone]      Please follow up.   Why:  With Gyn provider of your choice.  See list printed after education in your discharge paperwork.      [name] Certified Nurse-Midwife 02/06/2015  8:55 AM

## 2015-02-06 LAB — GC/CHLAMYDIA PROBE AMP (~~LOC~~) NOT AT ARMC
Chlamydia: NEGATIVE
NEISSERIA GONORRHEA: NEGATIVE

## 2015-02-06 LAB — HIV ANTIBODY (ROUTINE TESTING W REFLEX): HIV SCREEN 4TH GENERATION: NONREACTIVE

## 2015-03-04 ENCOUNTER — Other Ambulatory Visit (HOSPITAL_COMMUNITY): Payer: Self-pay | Admitting: Advanced Practice Midwife

## 2015-03-04 DIAGNOSIS — N644 Mastodynia: Secondary | ICD-10-CM

## 2015-03-04 DIAGNOSIS — N6012 Diffuse cystic mastopathy of left breast: Secondary | ICD-10-CM

## 2015-06-27 ENCOUNTER — Emergency Department (HOSPITAL_COMMUNITY)
Admission: EM | Admit: 2015-06-27 | Discharge: 2015-06-27 | Disposition: A | Discharge status: Home / Self Care | Payer: Medicaid Other | Attending: Emergency Medicine | Admitting: Emergency Medicine

## 2015-06-27 ENCOUNTER — Encounter (HOSPITAL_COMMUNITY): Payer: Self-pay | Admitting: *Deleted

## 2015-06-27 ENCOUNTER — Emergency Department (HOSPITAL_COMMUNITY): Payer: Medicaid Other

## 2015-06-27 DIAGNOSIS — R102 Pelvic and perineal pain unspecified side: Secondary | ICD-10-CM

## 2015-06-27 DIAGNOSIS — Z8639 Personal history of other endocrine, nutritional and metabolic disease: Secondary | ICD-10-CM | POA: Insufficient documentation

## 2015-06-27 DIAGNOSIS — K029 Dental caries, unspecified: Secondary | ICD-10-CM | POA: Insufficient documentation

## 2015-06-27 DIAGNOSIS — B373 Candidiasis of vulva and vagina: Secondary | ICD-10-CM | POA: Insufficient documentation

## 2015-06-27 DIAGNOSIS — Z720 Tobacco use: Secondary | ICD-10-CM | POA: Insufficient documentation

## 2015-06-27 DIAGNOSIS — D259 Leiomyoma of uterus, unspecified: Secondary | ICD-10-CM

## 2015-06-27 DIAGNOSIS — J45909 Unspecified asthma, uncomplicated: Secondary | ICD-10-CM | POA: Insufficient documentation

## 2015-06-27 DIAGNOSIS — Z792 Long term (current) use of antibiotics: Secondary | ICD-10-CM | POA: Insufficient documentation

## 2015-06-27 DIAGNOSIS — B3731 Acute candidiasis of vulva and vagina: Secondary | ICD-10-CM

## 2015-06-27 DIAGNOSIS — K047 Periapical abscess without sinus: Secondary | ICD-10-CM

## 2015-06-27 LAB — URINALYSIS, ROUTINE W REFLEX MICROSCOPIC
BILIRUBIN URINE: NEGATIVE
GLUCOSE, UA: NEGATIVE mg/dL
Hgb urine dipstick: NEGATIVE
KETONES UR: NEGATIVE mg/dL
LEUKOCYTES UA: NEGATIVE
Nitrite: NEGATIVE
PH: 6.5 (ref 5.0–8.0)
Protein, ur: NEGATIVE mg/dL
SPECIFIC GRAVITY, URINE: 1.015 (ref 1.005–1.030)
Urobilinogen, UA: 1 mg/dL (ref 0.0–1.0)

## 2015-06-27 LAB — WET PREP, GENITAL
Trich, Wet Prep: NONE SEEN
YEAST WET PREP: NONE SEEN

## 2015-06-27 MED ORDER — PENICILLIN V POTASSIUM 500 MG PO TABS: 500.0000 mg | ORAL_TABLET | Freq: Four times a day (QID) | ORAL | Status: AC

## 2015-06-27 MED ORDER — FLUCONAZOLE 150 MG PO TABS: 150.0000 mg | ORAL_TABLET | Freq: Every day | ORAL | Status: AC

## 2015-06-27 MED ORDER — CLOTRIMAZOLE 1 % EX CREA: TOPICAL_CREAM | CUTANEOUS | Status: DC

## 2015-06-27 NOTE — ED Notes (Signed)
Pt states vaginal irritation, and L labial swelling x 4 days and L facial swelling since last night.

## 2015-06-27 NOTE — Discharge Instructions (Signed)
Penicillin as prescribed until all gone for dental infection. Diflucan for yeast infection. Clotrimazole cream topically. Follow up with a dentist and OB/GYN  Dental Abscess A dental abscess is a collection of infected fluid (pus) from a bacterial infection in the inner part of the tooth (pulp). It usually occurs at the end of the tooth's root.  CAUSES   Severe tooth decay.  Trauma to the tooth that allows bacteria to enter into the pulp, such as a broken or chipped tooth. SYMPTOMS   Severe pain in and around the infected tooth.  Swelling and redness around the abscessed tooth or in the mouth or face.  Tenderness.  Pus drainage.  Bad breath.  Bitter taste in the mouth.  Difficulty swallowing.  Difficulty opening the mouth.  Nausea.  Vomiting.  Chills.  Swollen neck glands. DIAGNOSIS   A medical and dental history will be taken.  An examination will be performed by tapping on the abscessed tooth.  X-rays may be taken of the tooth to identify the abscess. TREATMENT The goal of treatment is to eliminate the infection. You may be prescribed antibiotic medicine to stop the infection from spreading. A root canal may be performed to save the tooth. If the tooth cannot be saved, it may be pulled (extracted) and the abscess may be drained.  HOME CARE INSTRUCTIONS  Only take over-the-counter or prescription medicines for pain, fever, or discomfort as directed by your caregiver.  Rinse your mouth (gargle) often with salt water ( tsp salt in 8 oz [250 ml] of warm water) to relieve pain or swelling.  Do not drive after taking pain medicine (narcotics).  Do not apply heat to the outside of your face.  Return to your dentist for further treatment as directed. SEEK MEDICAL CARE IF:  Your pain is not helped by medicine.  Your pain is getting worse instead of better. SEEK IMMEDIATE MEDICAL CARE IF:  You have a fever or persistent symptoms for more than 2-3 days.  You  have a fever and your symptoms suddenly get worse.  You have chills or a very bad headache.  You have problems breathing or swallowing.  You have trouble opening your mouth.  You have swelling in the neck or around the eye. Document Released: 10/24/2005 Document Revised: 07/18/2012 Document Reviewed: 02/01/2011 [facility] Patient Information 2015 [place], [place]. This information is not intended to replace advice given to you by your health care provider. Make sure you discuss any questions you have with your health care provider.  Uterine Fibroid A uterine fibroid is a growth (tumor) that occurs in your uterus. This type of tumor is not cancerous and does not spread out of the uterus. You can have one or many fibroids. Fibroids can vary in size, weight, and where they grow in the uterus. Some can become quite large. Most fibroids do not require medical treatment, but some can cause pain or heavy bleeding during and between periods. CAUSES  A fibroid is the result of a single uterine cell that keeps growing (unregulated), which is different than most cells in the human body. Most cells have a control mechanism that keeps them from reproducing without control.  SIGNS AND SYMPTOMS   Bleeding.  Pelvic pain and pressure.  Bladder problems due to the size of the fibroid.  Infertility and miscarriages depending on the size and location of the fibroid. DIAGNOSIS  Uterine fibroids are diagnosed through a physical exam. Your health care provider may feel the lumpy tumors during a  pelvic exam. Ultrasonography may be done to get information regarding size, location, and number of tumors.  TREATMENT   Your health care provider may recommend watchful waiting. This involves getting the fibroid checked by your health care provider to see if it grows or shrinks.   Hormone treatment or an intrauterine device (IUD) may be prescribed.   Surgery may be needed to remove the fibroids (myomectomy) or the  uterus (hysterectomy). This depends on your situation. When fibroids interfere with fertility and a woman wants to become pregnant, a health care provider may recommend having the fibroids removed.  [place] care depends on how you were treated. In general:   Keep all follow-up appointments with your health care provider.   Only take over-the-counter or prescription medicines as directed by your health care provider. If you were prescribed a hormone treatment, take the hormone medicines exactly as directed. Do not take aspirin. It can cause bleeding.   Talk to your health care provider about taking iron pills.  If your periods are troublesome but not so heavy, lie down with your feet raised slightly above your heart. Place cold packs on your lower abdomen.   If your periods are heavy, write down the number of pads or tampons you use per month. Bring this information to your health care provider.   Include green vegetables in your diet.  SEEK IMMEDIATE MEDICAL CARE IF:  You have pelvic pain or cramps not controlled with medicines.   You have a sudden increase in pelvic pain.   You have an increase in bleeding between and during periods.   You have excessive periods and soak tampons or pads in a half hour or less.  You feel lightheaded or have fainting episodes. Document Released: 10/21/2000 Document Revised: 08/14/2013 Document Reviewed: 05/23/2013 [facility] Patient Information 2015 [place], [place]. This information is not intended to replace advice given to you by your health care provider. Make sure you discuss any questions you have with your health care provider.

## 2015-06-27 NOTE — ED Provider Notes (Signed)
CSN: [id number]     Arrival date & time 06/27/15  0819 History   First MD Initiated Contact with Patient 06/27/15 [phone]     Chief Complaint  Patient presents with  . Facial Swelling  . Vaginal Discharge     (Consider location/radiation/quality/duration/timing/severity/associated sxs/prior Treatment) HPI Wendy Ross is a 39 y.o. female with history of asthma, presents to emergency department complaining of left facial swelling, vaginal discharge and vulvar itching. Patient states facial swelling started this morning. She states she's having some pain to the left face. She states she has several bad teeth but denies any dental pain. No injury to the face. No medication taken prior to coming in. Patient also states for last 4 days she has had some irritation to the private area, itching, discharge. She states she has to wear painting liners because of the discharge. Denies possibility of being pregnant. Denies any recent antibiotics. Denies any new products other than soap that she used only once that is different for her regular. She states she has pain with intercourse otherwise denies any vaginal or abdominal pain. She has only one sexual partner, married.  Past Medical History  Diagnosis Date  . Asthma   . Hypercholesteremia   . Vaginal Pap smear, abnormal    Past Surgical History  Procedure Laterality Date  . Tubal ligation     Family History  Problem Relation Age of Onset  . Hypertension Mother   . Hypertension Other    Social History  Substance Use Topics  . Smoking status: Current Every Day Smoker -- 0.50 packs/day    Types: Cigarettes  . Smokeless tobacco: None  . Alcohol Use: Yes     Comment: social   OB History    Gravida Para Term Preterm AB TAB SAB Ectopic Multiple Living   6 4 4  2 2    4      Review of Systems  Constitutional: Negative for fever and chills.  HENT: Positive for dental problem and facial swelling. Negative for congestion.   Respiratory:  Negative for cough, chest tightness and shortness of breath.   Cardiovascular: Negative for chest pain, palpitations and leg swelling.  Gastrointestinal: Negative for nausea, vomiting, abdominal pain and diarrhea.  Genitourinary: Positive for vaginal discharge. Negative for dysuria, flank pain, vaginal bleeding, vaginal pain and pelvic pain.  Musculoskeletal: Negative for myalgias, arthralgias, neck pain and neck stiffness.  Skin: Negative for rash.  Neurological: Negative for dizziness, weakness and headaches.  All other systems reviewed and are negative.     Allergies  Review of patient's allergies indicates no known allergies.  Home Medications   Prior to Admission medications   Medication Sig Start Date End Date Taking? Authorizing Provider  diphenhydrAMINE (BENADRYL) 25 MG tablet Take 2 tablets (50 mg total) by mouth every 4 (four) hours as needed for itching. 05/31/14   Elmyra Ricks Pisciotta, PA-C  ibuprofen (ADVIL,MOTRIN) 200 MG tablet Take 800 mg by mouth every 6 (six) hours as needed for moderate pain.     Historical Provider, MD  metroNIDAZOLE (FLAGYL) 500 MG tablet Take 1 tablet (500 mg total) by mouth 2 (two) times daily. 02/05/15   Lattie Haw A Leftwich-Kirby, CNM   BP 117/81 mmHg  Pulse 74  Temp(Src) 98.3 F (36.8 C) (Oral)  Resp 18  Ht 5\' 4"  (1.626 m)  Wt 158 lb (71.668 kg)  BMI 27.11 kg/m2  SpO2 100%  LMP 06/01/2015 Physical Exam  Constitutional: She is oriented to person, place, and time. She appears  well-developed and well-nourished. No distress.  HENT:  Head: Normocephalic.  Multiple dental cavities.  left upper second molar tender to palpation. there is minimal facial swelling to the left face. Tenderness over left maxilla. No trismus. No tenderness over TMJ.  Eyes: Conjunctivae are normal.  Neck: Neck supple.  Cardiovascular: Normal rate, regular rhythm and normal heart sounds.   Pulmonary/Chest: Effort normal and breath sounds normal. No respiratory distress. She has  no wheezes. She has no rales.  Abdominal: Soft. Bowel sounds are normal. She exhibits no distension. There is no tenderness. There is no rebound.  Genitourinary:  Normal external genitalia. Normal vaginal canal. Small thin white discharge. Cyst vs mass noted on the cervix. Non tender. No CMT. No uterine or adnexal tenderness. No masses palpated.    Musculoskeletal: She exhibits no edema.  Neurological: She is alert and oriented to person, place, and time.  Skin: Skin is warm and dry.  Psychiatric: She has a normal mood and affect. Her behavior is normal.  Nursing note and vitals reviewed.   ED Course  Procedures (including critical care time) Labs Review Labs Reviewed  WET PREP, GENITAL - Abnormal; Notable for the following:    Clue Cells Wet Prep HPF POC FEW (*)    WBC, Wet Prep HPF POC FEW (*)    All other components within normal limits  URINALYSIS, ROUTINE W REFLEX MICROSCOPIC (NOT AT [facility]) - Abnormal; Notable for the following:    APPearance CLOUDY (*)    All other components within normal limits  PREGNANCY, URINE  POC URINE PREG, ED  GC/CHLAMYDIA PROBE AMP ([place]) NOT AT [facility]    Imaging Review US Transvaginal Non-ob  06/27/2015   CLINICAL DATA:  Patient with pelvic pain for 4 days.  EXAM: TRANSABDOMINAL AND TRANSVAGINAL ULTRASOUND OF PELVIS  TECHNIQUE: Both transabdominal and transvaginal ultrasound examinations of the pelvis were performed. Transabdominal technique was performed for global imaging of the pelvis including uterus, ovaries, adnexal regions, and pelvic cul-de-sac. It was necessary to proceed with endovaginal exam following the transabdominal exam to visualize the endometrium and adnexal structures.  COMPARISON:  CT 10/04/2009  FINDINGS: Uterus  Measurements: 9.0 x 8.4 x 9.2 cm. There are multiple fibroids demonstrated throughout the uterus with the largest measuring 4.5 x 3.9 x 3.4 cm within the right uterine fundus. There is an additional 3.3 x 2.7 x 2.1 cm  fibroid within the mid uterine fundus and an adjacent 2.6 x 1.8 x 2.4 cm fibroid within the left aspect of the uterine fundus.  Endometrium  Thickness: 10 mm.  No focal abnormality visualized.  Right ovary  Measurements: 3.1 x 2.2 x 2.4 cm. Normal appearance/no adnexal mass.  Left ovary  Measurements: 3.0 x 1.6 x 1.9 cm. Normal appearance/no adnexal mass.  Other findings  Small amount of fluid within the right adnexa.  IMPRESSION: Enlarged fibroid uterus.  Normal ovaries.  Small amount of right adnexal free fluid.   Electronically Signed   By: Lovey Newcomer M.D.   On: 06/27/2015 12:07   US Pelvis Complete  06/27/2015   CLINICAL DATA:  Patient with pelvic pain for 4 days.  EXAM: TRANSABDOMINAL AND TRANSVAGINAL ULTRASOUND OF PELVIS  TECHNIQUE: Both transabdominal and transvaginal ultrasound examinations of the pelvis were performed. Transabdominal technique was performed for global imaging of the pelvis including uterus, ovaries, adnexal regions, and pelvic cul-de-sac. It was necessary to proceed with endovaginal exam following the transabdominal exam to visualize the endometrium and adnexal structures.  COMPARISON:  CT 10/04/2009  FINDINGS: Uterus  Measurements: 9.0 x 8.4 x 9.2 cm. There are multiple fibroids demonstrated throughout the uterus with the largest measuring 4.5 x 3.9 x 3.4 cm within the right uterine fundus. There is an additional 3.3 x 2.7 x 2.1 cm fibroid within the mid uterine fundus and an adjacent 2.6 x 1.8 x 2.4 cm fibroid within the left aspect of the uterine fundus.  Endometrium  Thickness: 10 mm.  No focal abnormality visualized.  Right ovary  Measurements: 3.1 x 2.2 x 2.4 cm. Normal appearance/no adnexal mass.  Left ovary  Measurements: 3.0 x 1.6 x 1.9 cm. Normal appearance/no adnexal mass.  Other findings  Small amount of fluid within the right adnexa.  IMPRESSION: Enlarged fibroid uterus.  Normal ovaries.  Small amount of right adnexal free fluid.   Electronically Signed   By: Lovey Newcomer  M.D.   On: 06/27/2015 12:07   I have personally reviewed and evaluated these images and lab results as part of my medical decision-making.   EKG Interpretation None      MDM   Final diagnoses:  Pelvic pain in female  Uterine leiomyoma, unspecified location  Candidal vulvitis  Dental abscess    Pt with vaginal irritation and discharge. Cervical cyst noted on exam. Will get [place]. Exam most consistent with candida vulvitis.    Most likely candida vulvitis. US showing fibroid uterus otherwise unremarkable. Exam not consistent with cervicitis or PD. VS normal. Plan to dc home with diflucan, penicillin for toothache, topical yeast cream. Return precautions discussed.   Filed Vitals:   06/27/15 1000 06/27/15 1103 06/27/15 1106 06/27/15 1233  BP: 106/66 100/61  100/60  Pulse: 86  61 67  Temp:    98.4 F (36.9 C)  TempSrc:    Oral  Resp: 18   17  Height:      Weight:      SpO2: 100%  100% 100%      Jeannett Senior, PA-C 06/27/15 [place], MD 06/27/15 1523

## 2015-06-29 LAB — GC/CHLAMYDIA PROBE AMP (~~LOC~~) NOT AT ARMC
CHLAMYDIA, DNA PROBE: NEGATIVE
NEISSERIA GONORRHEA: NEGATIVE

## 2015-07-23 ENCOUNTER — Emergency Department (HOSPITAL_COMMUNITY)
Admission: EM | Admit: 2015-07-23 | Discharge: 2015-07-23 | Disposition: A | Discharge status: Home / Self Care | Payer: Medicaid Other | Attending: Emergency Medicine | Admitting: Emergency Medicine

## 2015-07-23 ENCOUNTER — Encounter (HOSPITAL_COMMUNITY): Payer: Self-pay | Admitting: Emergency Medicine

## 2015-07-23 DIAGNOSIS — M546 Pain in thoracic spine: Secondary | ICD-10-CM | POA: Insufficient documentation

## 2015-07-23 DIAGNOSIS — Z720 Tobacco use: Secondary | ICD-10-CM | POA: Insufficient documentation

## 2015-07-23 DIAGNOSIS — J45909 Unspecified asthma, uncomplicated: Secondary | ICD-10-CM | POA: Insufficient documentation

## 2015-07-23 DIAGNOSIS — Z8639 Personal history of other endocrine, nutritional and metabolic disease: Secondary | ICD-10-CM | POA: Insufficient documentation

## 2015-07-23 DIAGNOSIS — M62838 Other muscle spasm: Secondary | ICD-10-CM | POA: Insufficient documentation

## 2015-07-23 LAB — URINALYSIS, ROUTINE W REFLEX MICROSCOPIC
BILIRUBIN URINE: NEGATIVE
GLUCOSE, UA: NEGATIVE mg/dL
Hgb urine dipstick: NEGATIVE
Ketones, ur: NEGATIVE mg/dL
Leukocytes, UA: NEGATIVE
NITRITE: NEGATIVE
PH: 7 (ref 5.0–8.0)
Protein, ur: NEGATIVE mg/dL
SPECIFIC GRAVITY, URINE: 1.013 (ref 1.005–1.030)
Urobilinogen, UA: 1 mg/dL (ref 0.0–1.0)

## 2015-07-23 LAB — I-STAT CHEM 8, ED
BUN: 6 mg/dL (ref 6–20)
CREATININE: 0.8 mg/dL (ref 0.44–1.00)
Calcium, Ion: 1.18 mmol/L (ref 1.12–1.23)
Chloride: 104 mmol/L (ref 101–111)
GLUCOSE: 86 mg/dL (ref 65–99)
HEMATOCRIT: 32 % — AB (ref 36.0–46.0)
Hemoglobin: 10.9 g/dL — ABNORMAL LOW (ref 12.0–15.0)
POTASSIUM: 3.9 mmol/L (ref 3.5–5.1)
Sodium: 139 mmol/L (ref 135–145)
TCO2: 22 mmol/L (ref 0–100)

## 2015-07-23 MED ORDER — CYCLOBENZAPRINE HCL 5 MG PO TABS: 5.0000 mg | ORAL_TABLET | Freq: Three times a day (TID) | ORAL | Status: DC | PRN

## 2015-07-23 MED ORDER — KETOROLAC TROMETHAMINE 60 MG/2ML IM SOLN
60.0000 mg | Freq: Once | INTRAMUSCULAR | Status: AC
  Administered 2015-07-23: 60 mg via INTRAMUSCULAR
  Filled 2015-07-23: qty 2

## 2015-07-23 MED ORDER — NAPROXEN 500 MG PO TABS: 500.0000 mg | ORAL_TABLET | Freq: Two times a day (BID) | ORAL | Status: DC

## 2015-07-23 NOTE — ED Provider Notes (Signed)
CSN: [id number]     Arrival date & time 07/23/15  8250 History   First MD Initiated Contact with Patient 07/23/15 0919     Chief Complaint  Patient presents with  . Back Pain     (Consider location/radiation/quality/duration/timing/severity/associated sxs/prior Treatment) Patient is a 39 y.o. female presenting with back pain.  Back Pain Location:  Thoracic spine (R) Quality:  Cramping Radiates to: across back and around to front. Pain severity:  Severe Onset quality:  Sudden Duration:  2 days Timing:  Constant Progression:  Unchanged Chronicity:  Recurrent Context comment:  While vacuuming, she developed a sudden muscle spasm Relieved by:  Nothing Worsened by:  Movement, deep breathing and twisting Associated symptoms: no fever   Associated symptoms comment:  No SOB   Past Medical History  Diagnosis Date  . Asthma   . Hypercholesteremia   . Vaginal Pap smear, abnormal    Past Surgical History  Procedure Laterality Date  . Tubal ligation     Family History  Problem Relation Age of Onset  . Hypertension Mother   . Hypertension Other    Social History  Substance Use Topics  . Smoking status: Current Every Day Smoker -- 0.50 packs/day    Types: Cigarettes  . Smokeless tobacco: None  . Alcohol Use: Yes     Comment: social   OB History    Gravida Para Term Preterm AB TAB SAB Ectopic Multiple Living   6 4 4  2 2    4      Review of Systems  Constitutional: Negative for fever.  Musculoskeletal: Positive for back pain.  All other systems reviewed and are negative.     Allergies  Percocet  Home Medications   Prior to Admission medications   Medication Sig Start Date End Date Taking? Authorizing Provider  ibuprofen (ADVIL,MOTRIN) 200 MG tablet Take 200-800 mg by mouth every 6 (six) hours as needed for fever, headache, mild pain, moderate pain or cramping.    Yes Historical Provider, MD  clotrimazole (LOTRIMIN) 1 % cream Apply to affected area 2 times  daily Patient not taking: Reported on 07/23/2015 06/27/15   Tatyana Kirichenko, PA-C   BP 118/75 mmHg  Pulse 80  Temp(Src) 98.5 F (36.9 C) (Oral)  Resp 18  SpO2 100%  LMP 07/02/2015 (Approximate) Physical Exam  Constitutional: She is oriented to person, place, and time. She appears well-developed and well-nourished. No distress.  HENT:  Head: Normocephalic and atraumatic.  Mouth/Throat: Oropharynx is clear and moist.  Eyes: Conjunctivae are normal. Pupils are equal, round, and reactive to light. No scleral icterus.  Neck: Neck supple.  Cardiovascular: Normal rate, regular rhythm, normal heart sounds and intact distal pulses.   No murmur heard. Pulmonary/Chest: Effort normal and breath sounds normal. No stridor. No respiratory distress. She has no rales.  Abdominal: Soft. Bowel sounds are normal. She exhibits no distension. There is no tenderness.  Musculoskeletal: Normal range of motion.       Back:  Neurological: She is alert and oriented to person, place, and time.  Skin: Skin is warm and dry. No rash noted.  Psychiatric: She has a normal mood and affect. Her behavior is normal.  Nursing note and vitals reviewed.   ED Course  Procedures (including critical care time) Labs Review Labs Reviewed  URINALYSIS, ROUTINE W REFLEX MICROSCOPIC (NOT AT [facility]) - Abnormal; Notable for the following:    APPearance CLOUDY (*)    All other components within normal limits  I-STAT CHEM 8, ED -  Abnormal; Notable for the following:    Hemoglobin 10.9 (*)    HCT 32.0 (*)    All other components within normal limits    Imaging Review No results found. I have personally reviewed and evaluated these images and lab results as part of my medical decision-making.   EKG Interpretation None      MDM   Final diagnoses:  Right-sided thoracic back pain  Muscle spasm    39 yo female who developed a severe muscle spasm yesterday while vacuuming.  She now has soreness all around the area of  the spasm.  I don't palpate a spasm now, but suspect she has typical muscle soreness as a sequela.  Advised muscle relaxers, NSAIDs, drinking lots of fluids.      Serita Grit, MD 07/23/15 1120

## 2015-07-23 NOTE — Discharge Instructions (Signed)

## 2015-07-23 NOTE — ED Notes (Signed)
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  States she was standing, not picking anything up, and felt like her back was "locking up".  States its hurts to bend over or twist her torso.  States she has never had a back injury, but the pain has occurred intermittently throughout the last year and she has been treated with "muscle relaxers".  Pain has been constant since 930 am 07/22/15.  Denies numbness or difficulty urinating.  Pt in no apparent distress at this time.

## 2016-01-11 ENCOUNTER — Encounter (HOSPITAL_COMMUNITY): Payer: Self-pay | Admitting: Emergency Medicine

## 2016-01-11 ENCOUNTER — Emergency Department (HOSPITAL_COMMUNITY): Payer: No Typology Code available for payment source

## 2016-01-11 ENCOUNTER — Emergency Department (HOSPITAL_COMMUNITY)
Admission: EM | Admit: 2016-01-11 | Discharge: 2016-01-12 | Disposition: A | Discharge status: Home / Self Care | Payer: No Typology Code available for payment source | Attending: Emergency Medicine | Admitting: Emergency Medicine

## 2016-01-11 DIAGNOSIS — Y9389 Activity, other specified: Secondary | ICD-10-CM | POA: Insufficient documentation

## 2016-01-11 DIAGNOSIS — S92502A Displaced unspecified fracture of left lesser toe(s), initial encounter for closed fracture: Secondary | ICD-10-CM

## 2016-01-11 DIAGNOSIS — W228XXA Striking against or struck by other objects, initial encounter: Secondary | ICD-10-CM | POA: Insufficient documentation

## 2016-01-11 DIAGNOSIS — Z791 Long term (current) use of non-steroidal anti-inflammatories (NSAID): Secondary | ICD-10-CM | POA: Insufficient documentation

## 2016-01-11 DIAGNOSIS — Y998 Other external cause status: Secondary | ICD-10-CM | POA: Insufficient documentation

## 2016-01-11 DIAGNOSIS — J45909 Unspecified asthma, uncomplicated: Secondary | ICD-10-CM | POA: Insufficient documentation

## 2016-01-11 DIAGNOSIS — S92515A Nondisplaced fracture of proximal phalanx of left lesser toe(s), initial encounter for closed fracture: Secondary | ICD-10-CM | POA: Insufficient documentation

## 2016-01-11 DIAGNOSIS — Y9289 Other specified places as the place of occurrence of the external cause: Secondary | ICD-10-CM | POA: Insufficient documentation

## 2016-01-11 DIAGNOSIS — F17210 Nicotine dependence, cigarettes, uncomplicated: Secondary | ICD-10-CM | POA: Insufficient documentation

## 2016-01-11 DIAGNOSIS — Z8639 Personal history of other endocrine, nutritional and metabolic disease: Secondary | ICD-10-CM | POA: Insufficient documentation

## 2016-01-11 MED ORDER — NAPROXEN 500 MG PO TABS: 500.0000 mg | ORAL_TABLET | Freq: Two times a day (BID) | ORAL | Status: DC

## 2016-01-11 MED ORDER — HYDROCODONE-ACETAMINOPHEN 5-325 MG PO TABS
1.0000 | ORAL_TABLET | Freq: Once | ORAL | Status: DC
Start: 2016-01-12 — End: 2016-01-12
  Filled 2016-01-11: qty 1

## 2016-01-11 MED ORDER — HYDROCODONE-ACETAMINOPHEN 5-325 MG PO TABS: 1.0000 | ORAL_TABLET | ORAL | Status: DC | PRN

## 2016-01-11 NOTE — Discharge Instructions (Signed)
Toe Fracture °A toe fracture is a break in one of the toe bones (phalanges). °CAUSES °This condition may be caused by: °· Dropping a heavy object on your toe. °· Stubbing your toe. °· Overusing your toe or doing repetitive exercise. °· Twisting or stretching your toe out of place. °RISK FACTORS °This condition is more likely to develop in people who: °· Play contact sports. °· Have a bone disease. °· Have a low calcium level. °SYMPTOMS °The main symptoms of this condition are swelling and pain in the toe. The pain may get worse with standing or walking. Other symptoms include: °· Bruising. °· Stiffness. °· Numbness. °· A change in the way the toe looks. °· Broken bones that poke through the skin. °· Blood beneath the toenail. °DIAGNOSIS °This condition is diagnosed with a physical exam. You may also have X-rays. °TREATMENT  °Treatment for this condition depends on the type of fracture and its severity. Treatment may involve: °· Taping the broken toe to a toe that is next to it (buddy taping). This is the most common treatment for fractures in which the bone has not moved out of place (nondisplaced fracture). °· Wearing a shoe that has a wide, rigid sole to protect the toe and to limit its movement. °· Wearing a walking cast. °· Having a procedure to move the toe back into place. °· Surgery. This may be needed: °¨ If there are many pieces of broken bone that are out of place (displaced). °¨ If the toe joint breaks. °¨ If the bone breaks through the skin. °· Physical therapy. This is done to help regain movement and strength in the toe. °You may need follow-up X-rays to make sure that the bone is healing well and staying in position. °HOME CARE INSTRUCTIONS °If You Have a Cast: °· Do not stick anything inside the cast to scratch your skin. Doing that increases your risk of infection. °· Check the skin around the cast every day. Report any concerns to your health care provider. You may put lotion on dry skin around the  edges of the cast. Do not apply lotion to the skin underneath the cast. °· Do not put pressure on any part of the cast until it is fully hardened. This may take several hours. °· Keep the cast clean and dry. °Bathing °· Do not take baths, swim, or use a hot tub until your health care provider approves. Ask your health care provider if you can take showers. You may only be allowed to take sponge baths for bathing. °· If your health care provider approves bathing and showering, cover the cast or bandage (dressing) with a watertight plastic bag to protect it from water. Do not let the cast or dressing get wet. °Managing Pain, Stiffness, and Swelling °· If you do not have a cast, apply ice to the injured area, if directed. °¨ Put ice in a plastic bag. °¨ Place a towel between your skin and the bag. °¨ Leave the ice on for 20 minutes, 2-3 times per day. °· Move your toes often to avoid stiffness and to lessen swelling. °· Raise (elevate) the injured area above the level of your heart while you are sitting or lying down. °Driving °· Do not drive or operate heavy machinery while taking pain medicine. °· Do not drive while wearing a cast on a foot that you use for driving. °Activity °· Return to your normal activities as directed by your health care provider. Ask your health care   provider what activities are safe for you. °· Perform exercises daily as directed by your health care provider or physical therapist. °Safety °· Do not use the injured limb to support your body weight until your health care provider says that you can. Use crutches or other assistive devices as directed by your health care provider. °General Instructions °· If your toe was treated with buddy taping, follow your health care provider's instructions for changing the gauze and tape. Change it more often: °¨ The gauze and tape get wet. If this happens, dry the space between the toes. °¨ The gauze and tape are too tight and cause your toe to become pale  or numb. °· Wear a protective shoe as directed by your health care provider. If you were not given a protective shoe, wear sturdy, supportive shoes. Your shoes should not pinch your toes and should not fit tightly against your toes. °· Do not use any tobacco products, including cigarettes, chewing tobacco, or e-cigarettes. Tobacco can delay bone healing. If you need help quitting, ask your health care provider. °· Take medicines only as directed by your health care provider. °· Keep all follow-up visits as directed by your health care provider. This is important. °SEEK MEDICAL CARE IF: °· You have a fever. °· Your pain medicine is not helping. °· Your toe is cold. °· Your toe is numb. °· You still have pain after one week of rest and treatment. °· You still have pain after your health care provider has said that you can start walking again. °· You have pain, tingling, or numbness in your foot that is not going away. °SEEK IMMEDIATE MEDICAL CARE IF: °· You have severe pain. °· You have redness or inflammation in your toe that is getting worse. °· You have pain or numbness in your toe that is getting worse. °· Your toe turns blue. °  °This information is not intended to replace advice given to you by your health care provider. Make sure you discuss any questions you have with your health care provider. °  °Document Released: 10/21/2000 Document Revised: 07/15/2015 Document Reviewed: 08/20/2014 °Elsevier Interactive Patient Education ©2016 [employer] ° °

## 2016-01-11 NOTE — ED Provider Notes (Signed)
CSN: [id number]     Arrival date & time 01/11/16  2125 History  By signing my name below, I, [facility], attest that this documentation has been prepared under the direction and in the presence of Linus Mako, PA-C. Electronically Signed: Virgel Bouquet, ED Scribe. 01/11/2016. 12:06 AM.   Chief Complaint  Patient presents with  . Foot Injury    The history is provided by the patient. No language interpreter was used.   HPI Comments: Wendy Ross is a 40 y.o. female who presents to the Emergency Department complaining of constant, 8/10 left foot pain onset 1 hour ago after an injury. Patient reports that she struck the front of left foot on the wooden end of her couch, followed by hearing a crunch and immediate pain. She is unable to bear weight secondary to pain and is unable to flex her second, third, and fourth toes secondary to pain. She has taken ibuprofen with minimal relief. Per patient, she works as a Secretary/administrator. She denies ankle pain.   PE:  Past Medical History  Diagnosis Date  . Asthma   . Hypercholesteremia   . Vaginal Pap smear, abnormal    Past Surgical History  Procedure Laterality Date  . Tubal ligation     Family History  Problem Relation Age of Onset  . Hypertension Mother   . Hypertension Other    Social History  Substance Use Topics  . Smoking status: Current Every Day Smoker -- 0.50 packs/day    Types: Cigarettes  . Smokeless tobacco: None  . Alcohol Use: Yes     Comment: social   OB History    Gravida Para Term Preterm AB TAB SAB Ectopic Multiple Living   6 4 4  2 2    4      Review of Systems  Musculoskeletal: Positive for joint swelling (Left third, fourth, and fifth toes) and arthralgias (left foot third, fourth, and fifth toes).  All other systems reviewed and are negative.     Allergies  Percocet  Home Medications   Prior to Admission medications   Medication Sig Start Date End Date Taking? Authorizing Provider   clotrimazole (LOTRIMIN) 1 % cream Apply to affected area 2 times daily Patient not taking: Reported on 07/23/2015 06/27/15   Tatyana Kirichenko, PA-C  cyclobenzaprine (FLEXERIL) 5 MG tablet Take 1 tablet (5 mg total) by mouth 3 (three) times daily as needed for muscle spasms. 07/23/15   Serita Grit, MD  HYDROcodone-acetaminophen (NORCO/VICODIN) 5-325 MG tablet Take 1-2 tablets by mouth every 4 (four) hours as needed. 01/11/16   Shamon Lobo Carlota Raspberry, PA-C  ibuprofen (ADVIL,MOTRIN) 200 MG tablet Take 200-800 mg by mouth every 6 (six) hours as needed for fever, headache, mild pain, moderate pain or cramping.     Historical Provider, MD  naproxen (NAPROSYN) 500 MG tablet Take 1 tablet (500 mg total) by mouth 2 (two) times daily. 07/23/15   Serita Grit, MD  naproxen (NAPROSYN) 500 MG tablet Take 1 tablet (500 mg total) by mouth 2 (two) times daily. 01/11/16   Finnlee Guarnieri Carlota Raspberry, PA-C   BP 112/72 mmHg  Pulse 86  Temp(Src) 98.6 F (37 C) (Oral)  Resp 18  SpO2 100%  LMP 01/11/2016 Physical Exam  Constitutional: She is oriented to person, place, and time. She appears well-developed and well-nourished. No distress.  HENT:  Head: Normocephalic and atraumatic.  Eyes: Conjunctivae and EOM are normal.  Neck: Neck supple. No tracheal deviation present.  Cardiovascular: Normal rate.   Pulmonary/Chest: Effort  normal. No respiratory distress.  Musculoskeletal: She exhibits tenderness.  Middle three toes of left foot appear swollen. Nml ROM of great toe. Patient does not want to ROM 2,3,4 toe due to pain. No pain with plantar flexion, eversion, and inversion of left foot.NIV  Neurological: She is alert and oriented to person, place, and time.  Skin: Skin is warm and dry. No erythema.  No warmth or erythema.  Psychiatric: She has a normal mood and affect. Her behavior is normal.  Nursing note and vitals reviewed.   ED Course  Procedures   DIAGNOSTIC STUDIES: Oxygen Saturation is 100% on RA, normal by my  interpretation.    COORDINATION OF CARE:  11:57 PM  Discussed results of left foot imaging. Will order post-operative shoe and crutches for third proximal fracture.  Will provide pt with referral for an orthopedist. Advised pt to follow-up with orthopedist if symptoms do not improve in 13 days. Discussed treatment plan with pt at bedside and pt agreed to plan.  Labs Review Labs Reviewed - No data to display  Imaging Review Dg Foot Complete Left  01/11/2016  CLINICAL DATA:  [place] [address] and bent toes back, pain first through third toes EXAM: LEFT FOOT - COMPLETE 3+ VIEW COMPARISON:  None. FINDINGS: Nondisplaced oblique fracture through the midshaft of the third proximal phalanx. IMPRESSION: Fracture third proximal phalanx Electronically Signed   By: Skipper Cliche M.D.   On: 01/11/2016 21:53   I have personally reviewed and evaluated these images and lab results as part of my medical decision-making.   EKG Interpretation None     MDM   Final diagnoses:  Fracture of third toe, left, closed, initial encounter    I personally performed the services described in this documentation, which was scribed in my presence. The recorded information has been reviewed and is accurate.   Delos Haring, PA-C 01/12/16 0011  Carmin Muskrat, MD 01/15/16 [phone]

## 2016-01-11 NOTE — ED Notes (Signed)
Patient states she stubbed 3rd, 4th, and 5th toes of left foot on end of couch PTA. Rates pain 10/10. 400mg  ibuprofen PTA.

## 2016-01-17 ENCOUNTER — Inpatient Hospital Stay (HOSPITAL_COMMUNITY)
Admission: AD | Admit: 2016-01-17 | Discharge: 2016-01-17 | Disposition: A | Discharge status: Home / Self Care | Payer: No Typology Code available for payment source | Source: Ambulatory Visit | Attending: Obstetrics & Gynecology | Admitting: Obstetrics & Gynecology

## 2016-01-17 ENCOUNTER — Encounter (HOSPITAL_COMMUNITY): Payer: Self-pay | Admitting: *Deleted

## 2016-01-17 DIAGNOSIS — B3731 Acute candidiasis of vulva and vagina: Secondary | ICD-10-CM

## 2016-01-17 DIAGNOSIS — J45909 Unspecified asthma, uncomplicated: Secondary | ICD-10-CM | POA: Insufficient documentation

## 2016-01-17 DIAGNOSIS — Z3202 Encounter for pregnancy test, result negative: Secondary | ICD-10-CM | POA: Insufficient documentation

## 2016-01-17 DIAGNOSIS — L259 Unspecified contact dermatitis, unspecified cause: Secondary | ICD-10-CM | POA: Insufficient documentation

## 2016-01-17 DIAGNOSIS — B373 Candidiasis of vulva and vagina: Secondary | ICD-10-CM | POA: Insufficient documentation

## 2016-01-17 DIAGNOSIS — E7800 Pure hypercholesterolemia, unspecified: Secondary | ICD-10-CM | POA: Insufficient documentation

## 2016-01-17 DIAGNOSIS — F17210 Nicotine dependence, cigarettes, uncomplicated: Secondary | ICD-10-CM | POA: Insufficient documentation

## 2016-01-17 LAB — URINALYSIS, ROUTINE W REFLEX MICROSCOPIC
BILIRUBIN URINE: NEGATIVE
Glucose, UA: NEGATIVE mg/dL
KETONES UR: NEGATIVE mg/dL
Leukocytes, UA: NEGATIVE
Nitrite: NEGATIVE
Protein, ur: NEGATIVE mg/dL
pH: 6 (ref 5.0–8.0)

## 2016-01-17 LAB — WET PREP, GENITAL
Clue Cells Wet Prep HPF POC: NONE SEEN
SPERM: NONE SEEN
TRICH WET PREP: NONE SEEN

## 2016-01-17 LAB — URINE MICROSCOPIC-ADD ON

## 2016-01-17 LAB — POCT PREGNANCY, URINE: PREG TEST UR: NEGATIVE

## 2016-01-17 MED ORDER — FLUCONAZOLE 150 MG PO TABS: 150.0000 mg | ORAL_TABLET | Freq: Once | ORAL | Status: DC | PRN

## 2016-01-17 MED ORDER — FLUCONAZOLE 150 MG PO TABS
150.0000 mg | ORAL_TABLET | Freq: Once | ORAL | Status: AC
  Administered 2016-01-17: 150 mg via ORAL
  Filled 2016-01-17: qty 1

## 2016-01-17 MED ORDER — TRIAMCINOLONE ACETONIDE 0.1 % EX CREA
TOPICAL_CREAM | Freq: Two times a day (BID) | CUTANEOUS | Status: DC
  Administered 2016-01-17: 11:00:00 via TOPICAL
  Filled 2016-01-17: qty 15

## 2016-01-17 NOTE — Discharge Instructions (Signed)
Do not have intercourse for one week or until symptoms have improved. Decrease intake of sugar including soda.  Monilial Vaginitis Vaginitis in a soreness, swelling and redness (inflammation) of the vagina and vulva. Monilial vaginitis is not a sexually transmitted infection. CAUSES  Yeast vaginitis is caused by yeast (candida) that is normally found in your vagina. With a yeast infection, the candida has overgrown in number to a point that upsets the chemical balance. SYMPTOMS   White, thick vaginal discharge.  Swelling, itching, redness and irritation of the vagina and possibly the lips of the vagina (vulva).  Burning or painful urination.  Painful intercourse. DIAGNOSIS  Things that may contribute to monilial vaginitis are:  Postmenopausal and virginal states.  Pregnancy.  Infections.  Being tired, sick or stressed, especially if you had monilial vaginitis in the past.  Diabetes. Good control will help lower the chance.  Birth control pills.  Tight fitting garments.  Using bubble bath, feminine sprays, douches or deodorant tampons.  Taking certain medications that kill germs (antibiotics).  Sporadic recurrence can occur if you become ill. TREATMENT  Your caregiver will give you medication.  There are several kinds of anti monilial vaginal creams and suppositories specific for monilial vaginitis. For recurrent yeast infections, use a suppository or cream in the vagina 2 times a week, or as directed.  Anti-monilial or steroid cream for the itching or irritation of the vulva may also be used. Get your caregiver's permission.  Painting the vagina with methylene blue solution may help if the monilial cream does not work.  Eating yogurt may help prevent monilial vaginitis. HOME CARE INSTRUCTIONS   Finish all medication as prescribed.  Do not have sex until treatment is completed or after your caregiver tells you it is okay.  Take warm sitz baths.  Do not  douche.  Do not use tampons, especially scented ones.  Wear cotton underwear.  Avoid tight pants and panty hose.  Tell your sexual partner that you have a yeast infection. They should go to their caregiver if they have symptoms such as mild rash or itching.  Your sexual partner should be treated as well if your infection is difficult to eliminate.  Practice safer sex. Use condoms.  Some vaginal medications cause latex condoms to fail. Vaginal medications that harm condoms are:  Cleocin cream.  Butoconazole (Femstat).  Terconazole (Terazol) vaginal suppository.  Miconazole (Monistat) (may be purchased over the counter). SEEK MEDICAL CARE IF:   You have a temperature by mouth above 102 F (38.9 C).  The infection is getting worse after 2 days of treatment.  The infection is not getting better after 3 days of treatment.  You develop blisters in or around your vagina.  You develop vaginal bleeding, and it is not your menstrual period.  You have pain when you urinate.  You develop intestinal problems.  You have pain with sexual intercourse.   This information is not intended to replace advice given to you by your health care provider. Make sure you discuss any questions you have with your health care provider.   Document Released: 08/03/2005 Document Revised: 01/16/2012 Document Reviewed: 04/27/2015 Elsevier Interactive Patient Education 2016 [place] Dermatitis Dermatitis is redness, soreness, and swelling (inflammation) of the skin. Contact dermatitis is a reaction to certain substances that touch the skin. There are two types of contact dermatitis:   Irritant contact dermatitis. This type is caused by something that irritates your skin, such as dry hands from washing them  too much. This type does not require previous exposure to the substance for a reaction to occur. This type is more common.  Allergic contact dermatitis. This type is caused by a  substance that you are allergic to, such as a nickel allergy or poison ivy. This type only occurs if you have been exposed to the substance (allergen) before. Upon a repeat exposure, your body reacts to the substance. This type is less common. CAUSES  Many different substances can cause contact dermatitis. Irritant contact dermatitis is most commonly caused by exposure to:   Makeup.   Soaps.   Detergents.   Bleaches.   Acids.   Metal salts, such as nickel.  Allergic contact dermatitis is most commonly caused by exposure to:   Poisonous plants.   Chemicals.   Jewelry.   Latex.   Medicines.   Preservatives in products, such as clothing.  RISK FACTORS This condition is more likely to develop in:   People who have jobs that expose them to irritants or allergens.  People who have certain medical conditions, such as asthma or eczema.  SYMPTOMS  Symptoms of this condition may occur anywhere on your body where the irritant has touched you or is touched by you. Symptoms include:  Dryness or flaking.   Redness.   Cracks.   Itching.   Pain or a burning feeling.   Blisters.  Drainage of small amounts of blood or clear fluid from skin cracks. With allergic contact dermatitis, there may also be swelling in areas such as the eyelids, mouth, or genitals.  DIAGNOSIS  This condition is diagnosed with a medical history and physical exam. A patch skin test may be performed to help determine the cause. If the condition is related to your job, you may need to see an occupational medicine specialist. TREATMENT Treatment for this condition includes figuring out what caused the reaction and protecting your skin from further contact. Treatment may also include:   Steroid creams or ointments. Oral steroid medicines may be needed in more severe cases.  Antibiotics or antibacterial ointments, if a skin infection is present.  Antihistamine lotion or an antihistamine  taken by mouth to ease itching.  A bandage (dressing). HOME CARE INSTRUCTIONS Skin Care  Moisturize your skin as needed.   Apply cool compresses to the affected areas.  Try taking a bath with:  Epsom salts. Follow the instructions on the packaging. You can get these at your local pharmacy or grocery store.  Baking soda. Pour a small amount into the bath as directed by your health care provider.  Colloidal oatmeal. Follow the instructions on the packaging. You can get this at your local pharmacy or grocery store.  Try applying baking soda paste to your skin. Stir water into baking soda until it reaches a paste-like consistency.  Do not scratch your skin.  Bathe less frequently, such as every other day.  Bathe in lukewarm water. Avoid using hot water. Medicines  Take or apply over-the-counter and prescription medicines only as told by your health care provider.   If you were prescribed an antibiotic medicine, take or apply your antibiotic as told by your health care provider. Do not stop using the antibiotic even if your condition starts to improve. General Instructions  Keep all follow-up visits as told by your health care provider. This is important.  Avoid the substance that caused your reaction. If you do not know what caused it, keep a journal to try to track what caused it. Write  down:  What you eat.  What cosmetic products you use.  What you drink.  What you wear in the affected area. This includes jewelry.  If you were given a dressing, take care of it as told by your health care provider. This includes when to change and remove it. SEEK MEDICAL CARE IF:   Your condition does not improve with treatment.  Your condition gets worse.  You have signs of infection such as swelling, tenderness, redness, soreness, or warmth in the affected area.  You have a fever.  You have new symptoms. SEEK IMMEDIATE MEDICAL CARE IF:   You have a severe headache, neck  pain, or neck stiffness.  You vomit.  You feel very sleepy.  You notice red streaks coming from the affected area.  Your bone or joint underneath the affected area becomes painful after the skin has healed.  The affected area turns darker.  You have difficulty breathing.   This information is not intended to replace advice given to you by your health care provider. Make sure you discuss any questions you have with your health care provider.   Document Released: 10/21/2000 Document Revised: 07/15/2015 Document Reviewed: 03/11/2015 Elsevier Interactive Patient Education [employer].

## 2016-01-17 NOTE — MAU Provider Note (Signed)
Chief Complaint: vaginal irritation   First Provider Initiated Contact with Patient 01/17/16 0844       SUBJECTIVE HPI: Wendy Ross is a 40 y.o. [id number] who presents [facility] reporting vaginal irritation and pain after use of new type of [place] jelly 4 days ago. Pt reports she immediately began feeling a burning and painful sensation. Pt reports her clitoris and external vulva are very sensitive and painful to touch. No relief with warm baths or sensitive soap. Complains of redness, burning, vaginal d/c but denies odor. Denies fever, chills, N/V, abdominal pain, or any urinary symptoms.   Location: external vagina and clitoris Quality: burning and painful sensation Duration: 4 days  Context: after using new KY jelly Timing: constant  Modifying factors: worse to touch. No relief with warm bath or sensitive soap  Associated signs and symptoms: vaginal discharge -- no odor   Past Medical History  Diagnosis Date  . Asthma   . Hypercholesteremia   . Vaginal Pap smear, abnormal    OB History  Gravida Para Term Preterm AB SAB TAB Ectopic Multiple Living  6 4 4  2  2   4     # Outcome Date GA Lbr Len/2nd Weight Sex Delivery Anes PTL Lv  6 TAB           5 TAB           4 Term           3 Term           2 Term           1 Term              Past Surgical History  Procedure Laterality Date  . Tubal ligation     Social History   Social History  . Marital Status: Single    Spouse Name: N/A  . Number of Children: N/A  . Years of Education: N/A   Occupational History  . Not on file.   Social History Main Topics  . Smoking status: Current Every Day Smoker -- 0.50 packs/day    Types: Cigarettes  . Smokeless tobacco: Not on file  . Alcohol Use: Yes     Comment: social  . Drug Use: No  . Sexual Activity: Yes    Birth Control/ Protection: Surgical   Other Topics Concern  . Not on file   Social History Narrative   No current facility-administered medications on  file prior to encounter.   Current Outpatient Prescriptions on File Prior to Encounter  Medication Sig Dispense Refill  . clotrimazole (LOTRIMIN) 1 % cream Apply to affected area 2 times daily (Patient not taking: Reported on 07/23/2015) 15 g 0  . cyclobenzaprine (FLEXERIL) 5 MG tablet Take 1 tablet (5 mg total) by mouth 3 (three) times daily as needed for muscle spasms. (Patient not taking: Reported on 01/17/2016) 10 tablet 0  . HYDROcodone-acetaminophen (NORCO/VICODIN) 5-325 MG tablet Take 1-2 tablets by mouth every 4 (four) hours as needed. (Patient not taking: Reported on 01/17/2016) 14 tablet 0  . naproxen (NAPROSYN) 500 MG tablet Take 1 tablet (500 mg total) by mouth 2 (two) times daily. (Patient not taking: Reported on 01/17/2016) 10 tablet 0  . naproxen (NAPROSYN) 500 MG tablet Take 1 tablet (500 mg total) by mouth 2 (two) times daily. (Patient not taking: Reported on 01/17/2016) 30 tablet 0   Allergies  Allergen Reactions  . Percocet [Oxycodone-Acetaminophen] Nausea Only    I have reviewed  the past Medical Hx, Surgical Hx, Social Hx, Allergies and Medications.   Review of Systems  Constitutional: Positive for activity change. Negative for fever and chills.  Respiratory: Negative for cough and shortness of breath.   Cardiovascular: Negative for chest pain.  Genitourinary: Negative for dysuria, frequency, hematuria and difficulty urinating.       External redness and burning with vaginal discharge     OBJECTIVE Patient Vitals for the past 24 hrs:  BP Temp Pulse Resp  01/17/16 0819 138/85 mmHg 98.4 F (36.9 C) 88 18   Constitutional: Well-developed, well-nourished female in no acute distress.  Cardiovascular: normal rate Respiratory: normal rate and effort.  GI: Abd soft, non-tender  MS: Extremities nontender, no edema, normal ROM Neurologic: Alert and oriented x 4.  GU: Neg CVAT.  SPECULUM EXAM: NEFG except for erythema and inflammation, physiologic discharge, no blood noted,  cervix clean   BIMANUAL: cervix anterior, nabothian cyst at 7 oclock, nonfriable,  uterus normal size, no adnexal tenderness or masses No CMT.  LAB RESULTS Results for orders placed or performed during the hospital encounter of 01/17/16 (from the past 24 hour(s))  Urinalysis, Routine w reflex microscopic (not at [facility])     Status: Abnormal   Collection Time: 01/17/16  8:00 AM  Result Value Ref Range   Color, Urine YELLOW YELLOW   APPearance CLEAR CLEAR   Specific Gravity, Urine >1.030 (H) 1.005 - 1.030   pH 6.0 5.0 - 8.0   Glucose, UA NEGATIVE NEGATIVE mg/dL   Hgb urine dipstick TRACE (A) NEGATIVE   Bilirubin Urine NEGATIVE NEGATIVE   Ketones, ur NEGATIVE NEGATIVE mg/dL   Protein, ur NEGATIVE NEGATIVE mg/dL   Nitrite NEGATIVE NEGATIVE   Leukocytes, UA NEGATIVE NEGATIVE  Urine microscopic-add on     Status: Abnormal   Collection Time: 01/17/16  8:00 AM  Result Value Ref Range   Squamous Epithelial / LPF 6-30 (A) NONE SEEN   WBC, UA 0-5 0 - 5 WBC/hpf   RBC / HPF 0-5 0 - 5 RBC/hpf   Bacteria, UA FEW (A) NONE SEEN   Urine-Other YEAST PRESENT   Pregnancy, urine POC     Status: None   Collection Time: 01/17/16  8:17 AM  Result Value Ref Range   Preg Test, Ur NEGATIVE NEGATIVE  Wet prep, genital     Status: Abnormal   Collection Time: 01/17/16  8:55 AM  Result Value Ref Range   Yeast Wet Prep HPF POC PRESENT (A) NONE SEEN   Trich, Wet Prep NONE SEEN NONE SEEN   Clue Cells Wet Prep HPF POC NONE SEEN NONE SEEN   WBC, Wet Prep HPF POC FEW (A) NONE SEEN   Sperm NONE SEEN     IMAGING Dg Foot Complete Left  01/11/2016  CLINICAL DATA:  [place] and bent toes back, pain first through third toes EXAM: LEFT FOOT - COMPLETE 3+ VIEW COMPARISON:  None. FINDINGS: Nondisplaced oblique fracture through the midshaft of the third proximal phalanx. IMPRESSION: Fracture third proximal phalanx Electronically Signed   By: Skipper Cliche M.D.   On: 01/11/2016 21:53    MAU COURSE  MDM  ASSESSMENT 1.  Vulvovaginal candidiasis   2. Acute contact dermatitis     PLAN Discharge home in stable condition. Discharge instructions were given to abstain from intercourse for one week or until symptoms resolve. Pt given prescription for Diflucan 150 mg and triamcinolone cream for vaginal irritation. Instructions given to follow up w/ GYN if symptoms persist.  Follow-up Information    Follow up with Gynecologist of your choice.   Why:  As needed if symptoms worsen      Follow up with [facility].   Why:  As needed in emergencies   Contact information:   [address] [id number] Amanda Bystrom [phone]       Medication List    TAKE these medications        fluconazole 150 MG tablet  Commonly known as:  DIFLUCAN  Take 1 tablet (150 mg total) by mouth once as needed (If no relief of symptoms in 3 days).      ASK your doctor about these medications        clotrimazole 1 % cream  Commonly known as:  LOTRIMIN  Apply to affected area 2 times daily     cyclobenzaprine 5 MG tablet  Commonly known as:  FLEXERIL  Take 1 tablet (5 mg total) by mouth 3 (three) times daily as needed for muscle spasms.     HYDROcodone-acetaminophen 5-325 MG tablet  Commonly known as:  NORCO/VICODIN  Take 1-2 tablets by mouth every 4 (four) hours as needed.     naproxen 500 MG tablet  Commonly known as:  NAPROSYN  Take 1 tablet (500 mg total) by mouth 2 (two) times daily.     naproxen 500 MG tablet  Commonly known as:  NAPROSYN  Take 1 tablet (500 mg total) by mouth 2 (two) times daily.         Lilia Pro Holland-Payne 01/17/2016  10:25 AM

## 2016-01-17 NOTE — MAU Note (Signed)
Pt presents to MAU with complaints of vaginal irritation. Pt states she used [employer] and it was a different brand and she thinks she may be having an allergic reaction

## 2016-01-18 LAB — GC/CHLAMYDIA PROBE AMP (~~LOC~~) NOT AT ARMC
CHLAMYDIA, DNA PROBE: NEGATIVE
Neisseria Gonorrhea: NEGATIVE

## 2016-07-18 ENCOUNTER — Emergency Department (HOSPITAL_COMMUNITY)
Admission: EM | Admit: 2016-07-18 | Discharge: 2016-07-18 | Disposition: A | Discharge status: Home / Self Care | Payer: Medicaid Other | Attending: Emergency Medicine | Admitting: Emergency Medicine

## 2016-07-18 ENCOUNTER — Encounter (HOSPITAL_COMMUNITY): Payer: Self-pay | Admitting: Emergency Medicine

## 2016-07-18 DIAGNOSIS — Z791 Long term (current) use of non-steroidal anti-inflammatories (NSAID): Secondary | ICD-10-CM | POA: Insufficient documentation

## 2016-07-18 DIAGNOSIS — W260XXA Contact with knife, initial encounter: Secondary | ICD-10-CM | POA: Insufficient documentation

## 2016-07-18 DIAGNOSIS — J45909 Unspecified asthma, uncomplicated: Secondary | ICD-10-CM | POA: Insufficient documentation

## 2016-07-18 DIAGNOSIS — Y999 Unspecified external cause status: Secondary | ICD-10-CM | POA: Insufficient documentation

## 2016-07-18 DIAGNOSIS — F17210 Nicotine dependence, cigarettes, uncomplicated: Secondary | ICD-10-CM | POA: Insufficient documentation

## 2016-07-18 DIAGNOSIS — S61219A Laceration without foreign body of unspecified finger without damage to nail, initial encounter: Secondary | ICD-10-CM

## 2016-07-18 DIAGNOSIS — S61217A Laceration without foreign body of left little finger without damage to nail, initial encounter: Secondary | ICD-10-CM | POA: Insufficient documentation

## 2016-07-18 DIAGNOSIS — Y929 Unspecified place or not applicable: Secondary | ICD-10-CM | POA: Insufficient documentation

## 2016-07-18 DIAGNOSIS — Y9389 Activity, other specified: Secondary | ICD-10-CM | POA: Insufficient documentation

## 2016-07-18 MED ORDER — TETANUS-DIPHTH-ACELL PERTUSSIS 5-2.5-18.5 LF-MCG/0.5 IM SUSP
0.5000 mL | Freq: Once | INTRAMUSCULAR | Status: AC
  Administered 2016-07-18: 0.5 mL via INTRAMUSCULAR
  Filled 2016-07-18: qty 0.5

## 2016-07-18 NOTE — ED Notes (Signed)
MD at bedside. 

## 2016-07-18 NOTE — ED Notes (Signed)
Made Dr Maryan Rued aware that Derm[facility]ond is in room and ready for her.

## 2016-07-18 NOTE — ED Notes (Signed)
Made ortho aware about finger splint ordered verbally by Dr Maryan Rued

## 2016-07-18 NOTE — ED Triage Notes (Signed)
Patient states last night she was cutting meat with a knife when she got distracted by her son and cut her 5th finger on left hand.  Patient has it bandaged at this time and states "I am scared to unwrap it".

## 2016-07-18 NOTE — ED Provider Notes (Signed)
[facility] DEPT Provider Note   CSN: [id number] Arrival date & time: 07/18/16  0807     History   Chief Complaint Chief Complaint  Patient presents with  . Finger Injury    HPI Wendy Ross is a 40 y.o. female.  Patient is a 40 year old female who comes in today for a laceration of her left fifth digit. She states last night she was cutting meat when her son ask her question. When she turned her head to look she accidentally cut her finger. She denies any numbness and is able to move her finger without difficulty.      Past Medical History:  Diagnosis Date  . Asthma   . Hypercholesteremia   . Vaginal Pap smear, abnormal     There are no active problems to display for this patient.   Past Surgical History:  Procedure Laterality Date  . TUBAL LIGATION    . WISDOM TOOTH EXTRACTION      OB History    Gravida Para Term Preterm AB Living   6 4 4   2 4    SAB TAB Ectopic Multiple Live Births     2             Home Medications    Prior to Admission medications   Medication Sig Start Date End Date Taking? Authorizing Provider  clotrimazole (LOTRIMIN) 1 % cream Apply to affected area 2 times daily Patient not taking: Reported on 07/23/2015 06/27/15   Tatyana Kirichenko, PA-C  cyclobenzaprine (FLEXERIL) 5 MG tablet Take 1 tablet (5 mg total) by mouth 3 (three) times daily as needed for muscle spasms. Patient not taking: Reported on 01/17/2016 07/23/15   Serita Grit, MD  fluconazole (DIFLUCAN) 150 MG tablet Take 1 tablet (150 mg total) by mouth once as needed (If no relief of symptoms in 3 days). 01/17/16   Manya Silvas, CNM  HYDROcodone-acetaminophen (NORCO/VICODIN) 5-325 MG tablet Take 1-2 tablets by mouth every 4 (four) hours as needed. Patient not taking: Reported on 01/17/2016 01/11/16   Delos Haring, PA-C  naproxen (NAPROSYN) 500 MG tablet Take 1 tablet (500 mg total) by mouth 2 (two) times daily. Patient not taking: Reported on 01/17/2016 07/23/15   Serita Grit, MD  naproxen (NAPROSYN) 500 MG tablet Take 1 tablet (500 mg total) by mouth 2 (two) times daily. Patient not taking: Reported on 01/17/2016 01/11/16   Delos Haring, PA-C    Family History Family History  Problem Relation Age of Onset  . Hypertension Mother   . Hypertension Other     Social History Social History  Substance Use Topics  . Smoking status: Current Every Day Smoker    Packs/day: 0.50    Types: Cigarettes  . Smokeless tobacco: Never Used  . Alcohol use Yes     Comment: social     Allergies   Percocet [oxycodone-acetaminophen]   Review of Systems Review of Systems  All other systems reviewed and are negative.    Physical Exam Updated Vital Signs BP 123/78 (BP Location: Right Arm)   Pulse 75   Temp 97.9 F (36.6 C) (Oral)   Resp 17   Ht 5\' 8"  (1.727 m)   Wt 156 lb (70.8 kg)   LMP 07/17/2016   SpO2 100%   BMI 23.72 kg/m   Physical Exam  Constitutional: She is oriented to person, place, and time. She appears well-developed and well-nourished. No distress.  HENT:  Head: Normocephalic and atraumatic.  Cardiovascular: Normal rate.   Pulmonary/Chest: Effort  normal.  Musculoskeletal:       Hands: Neurological: She is alert and oriented to person, place, and time.  Skin: Skin is warm and dry. Capillary refill takes less than 2 seconds.  Psychiatric: She has a normal mood and affect. Her behavior is normal.  Nursing note and vitals reviewed.    ED Treatments / Results  Labs (all labs ordered are listed, but only abnormal results are displayed) Labs Reviewed - No data to display  EKG  EKG Interpretation None       Radiology No results found.  Procedures Procedures (including critical care time)  Medications Ordered in ED Medications  Tdap (BOOSTRIX) injection 0.5 mL (not administered)     Initial Impression / Assessment and Plan / ED Course  I have reviewed the triage vital signs and the nursing notes.  Pertinent labs &  imaging results that were available during my care of the patient were reviewed by me and considered in my medical decision making (see chart for details).  Clinical Course    Patient with a laceration to the lateral aspect of her left fifth digit. No nail involvement.  Wound repair as below. Tetanus updated.  LACERATION REPAIR Performed by: Blanchie Dessert Authorized byBlanchie Dessert Consent: Verbal consent obtained. Risks and benefits: risks, benefits and alternatives were discussed Consent given by: patient Patient identity confirmed: provided demographic data Prepped and Draped in normal sterile fashion Wound explored  Laceration Location: left 5th digit  Laceration Length: 2 cm  No Foreign Bodies seen or palpated  Anesthesia: none Irrigation method: syringe Amount of cleaning: standard  Skin closure: dermabond   Patient tolerance: Patient tolerated the procedure well with no immediate complications.   Final Clinical Impressions(s) / ED Diagnoses   Final diagnoses:  Finger laceration, initial encounter    New Prescriptions New Prescriptions   No medications on file     Blanchie Dessert, MD 07/18/16 [phone]

## 2017-02-09 ENCOUNTER — Emergency Department (HOSPITAL_COMMUNITY)
Admission: EM | Admit: 2017-02-09 | Discharge: 2017-02-09 | Disposition: A | Discharge status: Home / Self Care | Payer: Medicaid Other | Attending: Emergency Medicine | Admitting: Emergency Medicine

## 2017-02-09 ENCOUNTER — Encounter (HOSPITAL_COMMUNITY): Payer: Self-pay | Admitting: Emergency Medicine

## 2017-02-09 DIAGNOSIS — N898 Other specified noninflammatory disorders of vagina: Secondary | ICD-10-CM | POA: Insufficient documentation

## 2017-02-09 DIAGNOSIS — L0591 Pilonidal cyst without abscess: Secondary | ICD-10-CM

## 2017-02-09 DIAGNOSIS — F17210 Nicotine dependence, cigarettes, uncomplicated: Secondary | ICD-10-CM | POA: Insufficient documentation

## 2017-02-09 DIAGNOSIS — J45909 Unspecified asthma, uncomplicated: Secondary | ICD-10-CM | POA: Insufficient documentation

## 2017-02-09 LAB — POC URINE PREG, ED: Preg Test, Ur: NEGATIVE

## 2017-02-09 LAB — WET PREP, GENITAL
Clue Cells Wet Prep HPF POC: NONE SEEN
SPERM: NONE SEEN
Trich, Wet Prep: NONE SEEN
WBC WET PREP: NONE SEEN
Yeast Wet Prep HPF POC: NONE SEEN

## 2017-02-09 MED ORDER — NAPROXEN 500 MG PO TABS: 500.0000 mg | ORAL_TABLET | Freq: Two times a day (BID) | ORAL | 0 refills | Status: DC

## 2017-02-09 MED ORDER — SULFAMETHOXAZOLE-TRIMETHOPRIM 800-160 MG PO TABS: 1.0000 | ORAL_TABLET | Freq: Two times a day (BID) | ORAL | 0 refills | Status: AC

## 2017-02-09 MED ORDER — IBUPROFEN 200 MG PO TABS
600.0000 mg | ORAL_TABLET | Freq: Once | ORAL | Status: AC
  Administered 2017-02-09: 600 mg via ORAL
  Filled 2017-02-09: qty 3

## 2017-02-09 NOTE — ED Provider Notes (Signed)
[facility] DEPT Provider Note   CSN: [id number] Arrival date & time: 02/09/17  0808     History   Chief Complaint Chief Complaint  Patient presents with  . knot on tailbone    HPI Wendy Ross is a 41 y.o. female.  HPI  41 year old female presents with a painful lump to her tailbone. She states that this was present 3 weeks ago or so. Seemed to resolve after applying warm compresses. However over the last 2 days it has come back. She has not noted any redness or skin changes. There has been no drainage. Is painful to sit down or lay down. No fevers or weight loss. No injuries to the area. Not using compresses this time.  Patient also complains of vaginal irritation. She states that she shaved 2 weeks ago and ever since has been having irritation and burning. No dysuria. She states she has had increase in vaginal discharge. She is only sexually active with one partner and has low concern for STI. No abdominal pain. No bleeding. She tried an over-the-counter "vaginal cream" but this seemed to make it worse.  Past Medical History:  Diagnosis Date  . Asthma   . Hypercholesteremia   . Vaginal Pap smear, abnormal     There are no active problems to display for this patient.   Past Surgical History:  Procedure Laterality Date  . TUBAL LIGATION    . WISDOM TOOTH EXTRACTION      OB History    Gravida Para Term Preterm AB Living   6 4 4   2 4    SAB TAB Ectopic Multiple Live Births     2             Home Medications    Prior to Admission medications   Medication Sig Start Date End Date Taking? Authorizing Provider  tetrahydrozoline 0.05 % ophthalmic solution Place 1 drop into both eyes daily as needed (eye irritation).   Yes Historical Provider, MD  clotrimazole (LOTRIMIN) 1 % cream Apply to affected area 2 times daily Patient not taking: Reported on 07/23/2015 06/27/15   Tatyana Kirichenko, PA-C  cyclobenzaprine (FLEXERIL) 5 MG tablet Take 1 tablet (5 mg total) by  mouth 3 (three) times daily as needed for muscle spasms. Patient not taking: Reported on 01/17/2016 07/23/15   Serita Grit, MD  fluconazole (DIFLUCAN) 150 MG tablet Take 1 tablet (150 mg total) by mouth once as needed (If no relief of symptoms in 3 days). Patient not taking: Reported on 02/09/2017 01/17/16   Manya Silvas, CNM  HYDROcodone-acetaminophen (NORCO/VICODIN) 5-325 MG tablet Take 1-2 tablets by mouth every 4 (four) hours as needed. Patient not taking: Reported on 01/17/2016 01/11/16   Delos Haring, PA-C  naproxen (NAPROSYN) 500 MG tablet Take 1 tablet (500 mg total) by mouth 2 (two) times daily. 02/09/17   Sherwood Gambler, MD  sulfamethoxazole-trimethoprim (BACTRIM DS,SEPTRA DS) 800-160 MG tablet Take 1 tablet by mouth 2 (two) times daily. 02/09/17 02/14/17  Sherwood Gambler, MD    Family History Family History  Problem Relation Age of Onset  . Hypertension Mother   . Hypertension Other     Social History Social History  Substance Use Topics  . Smoking status: Current Every Day Smoker    Packs/day: 0.50    Types: Cigarettes  . Smokeless tobacco: Never Used  . Alcohol use Yes     Comment: social     Allergies   Percocet [oxycodone-acetaminophen]   Review of Systems Review of Systems  Constitutional: Negative for fever and unexpected weight change.  Gastrointestinal: Negative for abdominal pain.  Genitourinary: Positive for vaginal discharge and vaginal pain. Negative for vaginal bleeding.  Skin: Negative for color change and wound.  All other systems reviewed and are negative.    Physical Exam Updated Vital Signs BP (!) 134/93 (BP Location: Right Arm)   Pulse 81   Temp 97.8 F (36.6 C) (Oral)   Resp 17   Ht 5\' 4"  (1.626 m)   Wt 160 lb (72.6 kg)   LMP 01/24/2017   SpO2 97%   BMI 27.46 kg/m   Physical Exam  Constitutional: She is oriented to person, place, and time. She appears well-developed and well-nourished. No distress.  HENT:  Head: Normocephalic and  atraumatic.  Right Ear: External ear normal.  Left Ear: External ear normal.  Nose: Nose normal.  Eyes: Right eye exhibits no discharge. Left eye exhibits no discharge.  Cardiovascular: Normal rate, regular rhythm and normal heart sounds.   Pulmonary/Chest: Effort normal and breath sounds normal.  Abdominal: Soft. She exhibits no distension. There is no tenderness.  Genitourinary:    Cervix exhibits no motion tenderness and no friability. Right adnexum displays no mass. Left adnexum displays no mass. No tenderness or bleeding in the vagina. Vaginal discharge (mild) found.  Neurological: She is alert and oriented to person, place, and time.  Skin: Skin is warm and dry. She is not diaphoretic.     Nursing note and vitals reviewed.    ED Treatments / Results  Labs (all labs ordered are listed, but only abnormal results are displayed) Labs Reviewed  WET PREP, GENITAL  POC URINE PREG, ED  GC/CHLAMYDIA PROBE AMP ([place]) NOT AT [facility]    EKG  EKG Interpretation None       Radiology No results found.  Procedures Procedures (including critical care time)  Medications Ordered in ED Medications  ibuprofen (ADVIL,MOTRIN) tablet 600 mg (not administered)     Initial Impression / Assessment and Plan / ED Course  I have reviewed the triage vital signs and the nursing notes.  Pertinent labs & imaging results that were available during my care of the patient were reviewed by me and considered in my medical decision making (see chart for details).     Patient's "knot" appears to be a pilonidal cyst. Is not erythematous she does not have skin changes. It is likely inflamed, will discuss using NSAIDs. However given how painful it is, there could be a developing infection, will trial Bactrim. Continue warm compresses. Her vaginal exam is unremarkable. Maybe some mild skin irritation but no signs of significant infection or STI. Follow-up with general surgery. Discussed return  precautions.  Final Clinical Impressions(s) / ED Diagnoses   Final diagnoses:  Pilonidal cyst    New Prescriptions New Prescriptions   NAPROXEN (NAPROSYN) 500 MG TABLET    Take 1 tablet (500 mg total) by mouth 2 (two) times daily.   SULFAMETHOXAZOLE-TRIMETHOPRIM (BACTRIM DS,SEPTRA DS) 800-160 MG TABLET    Take 1 tablet by mouth 2 (two) times daily.     Sherwood Gambler, MD 02/09/17 [id number]

## 2017-02-09 NOTE — ED Triage Notes (Signed)
Patient c/o knot on tailbone area that was there week ago and went away after applying warm compresses. Patient states that very painful when sitting. Patient denies any drainage.

## 2017-02-09 NOTE — ED Notes (Signed)
Patient is A & O x4.  Patient understood AVS forms with no instructions.

## 2017-02-10 LAB — GC/CHLAMYDIA PROBE AMP (~~LOC~~) NOT AT ARMC
Chlamydia: NEGATIVE
Neisseria Gonorrhea: NEGATIVE

## 2017-06-06 ENCOUNTER — Encounter (HOSPITAL_COMMUNITY): Payer: Self-pay | Admitting: *Deleted

## 2017-06-06 ENCOUNTER — Inpatient Hospital Stay (HOSPITAL_COMMUNITY)
Admission: AD | Admit: 2017-06-06 | Discharge: 2017-06-06 | Disposition: A | Discharge status: Home / Self Care | Payer: Medicaid Other | Source: Ambulatory Visit | Attending: Family Medicine | Admitting: Family Medicine

## 2017-06-06 DIAGNOSIS — F17210 Nicotine dependence, cigarettes, uncomplicated: Secondary | ICD-10-CM | POA: Insufficient documentation

## 2017-06-06 DIAGNOSIS — N760 Acute vaginitis: Secondary | ICD-10-CM | POA: Insufficient documentation

## 2017-06-06 DIAGNOSIS — B9689 Other specified bacterial agents as the cause of diseases classified elsewhere: Secondary | ICD-10-CM | POA: Insufficient documentation

## 2017-06-06 DIAGNOSIS — B372 Candidiasis of skin and nail: Secondary | ICD-10-CM

## 2017-06-06 DIAGNOSIS — Z8249 Family history of ischemic heart disease and other diseases of the circulatory system: Secondary | ICD-10-CM | POA: Insufficient documentation

## 2017-06-06 DIAGNOSIS — Z885 Allergy status to narcotic agent status: Secondary | ICD-10-CM | POA: Insufficient documentation

## 2017-06-06 LAB — URINALYSIS, ROUTINE W REFLEX MICROSCOPIC
Bacteria, UA: NONE SEEN
Bilirubin Urine: NEGATIVE
Glucose, UA: NEGATIVE mg/dL
HGB URINE DIPSTICK: NEGATIVE
Ketones, ur: NEGATIVE mg/dL
Nitrite: NEGATIVE
PROTEIN: NEGATIVE mg/dL
Specific Gravity, Urine: 1.014 (ref 1.005–1.030)
pH: 5 (ref 5.0–8.0)

## 2017-06-06 LAB — WET PREP, GENITAL
Sperm: NONE SEEN
Trich, Wet Prep: NONE SEEN
Yeast Wet Prep HPF POC: NONE SEEN

## 2017-06-06 LAB — POCT PREGNANCY, URINE: Preg Test, Ur: NEGATIVE

## 2017-06-06 MED ORDER — NYSTATIN 100000 UNIT/GM EX CREA: TOPICAL_CREAM | CUTANEOUS | 0 refills | Status: DC

## 2017-06-06 MED ORDER — METRONIDAZOLE 500 MG PO TABS: 500.0000 mg | ORAL_TABLET | Freq: Two times a day (BID) | ORAL | 0 refills | Status: DC

## 2017-06-06 NOTE — MAU Note (Signed)
Vaginal ? Rash started over a month ago.  Initially was "dry", went to [facility], was told all was well.  Tried hydrocortisone cream per instructions, has gotten worse.  Bumps noted.  Itching and irritation from vagina to rectum.  No bleeding or pain.  Watery, yellow d/c noted x2wks,  Has also tried cream for yeast and it just is getting worse

## 2017-06-06 NOTE — MAU Provider Note (Signed)
History     CSN: [id number]  Arrival date and time: 06/06/17 7902   First Provider Initiated Contact with Patient 06/06/17 0900      Chief Complaint  Patient presents with  . Vaginal Itching  . Vaginal Discharge   HPI Ms. Wendy Ross is a 41 y.o. [id number] who presents to MAU today with complaint of vaginal discharge and irritation. The patient states that she was seen with the same complaints at [facility] in April and told everything was normal. She states that she has a lot of pruritis. She has tried hydrocortisone cream and OTC Monistat without relief. She also has noted a yellow discharge recently. She is sexually active with one partner, her husband. She denies abdominal pain, UTI symptoms, fever or vaginal bleeding.   OB History    Gravida Para Term Preterm AB Living   6 4 4   2 4    SAB TAB Ectopic Multiple Live Births     2            Past Medical History:  Diagnosis Date  . Asthma   . Hypercholesteremia   . Vaginal Pap smear, abnormal     Past Surgical History:  Procedure Laterality Date  . TUBAL LIGATION    . WISDOM TOOTH EXTRACTION      Family History  Problem Relation Age of Onset  . Hypertension Mother   . Hypertension Other     Social History  Substance Use Topics  . Smoking status: Current Every Day Smoker    Packs/day: 0.25    Types: Cigarettes  . Smokeless tobacco: Never Used  . Alcohol use Yes     Comment: social    Allergies:  Allergies  Allergen Reactions  . Percocet [Oxycodone-Acetaminophen] Nausea Only    Prescriptions Prior to Admission  Medication Sig Dispense Refill Last Dose  . naproxen (NAPROSYN) 500 MG tablet Take 1 tablet (500 mg total) by mouth 2 (two) times daily. 14 tablet 0   . tetrahydrozoline 0.05 % ophthalmic solution Place 1 drop into both eyes daily as needed (eye irritation).   prn    Review of Systems  Constitutional: Negative for fever.  Gastrointestinal: Negative for abdominal pain, constipation, diarrhea,  nausea and vomiting.  Genitourinary: Positive for vaginal discharge and vaginal pain. Negative for dysuria, frequency, urgency and vaginal bleeding.   Physical Exam   Blood pressure 133/77, pulse 74, temperature 98.1 F (36.7 C), temperature source Oral, resp. rate 18, weight 166 lb 12 oz (75.6 kg), last menstrual period 05/09/2017.  Physical Exam  Nursing note and vitals reviewed. Constitutional: She is oriented to person, place, and time. She appears well-developed and well-nourished. No distress.  HENT:  Head: Normocephalic and atraumatic.  Cardiovascular: Normal rate.   Respiratory: Effort normal.  GI: Soft. She exhibits no distension and no mass. There is no tenderness. There is no rebound and no guarding.  Genitourinary:    There is tenderness on the right labia. There is no rash or lesion on the right labia. There is tenderness on the left labia. There is no rash or lesion on the left labia. Uterus is not enlarged and not tender. Cervix exhibits friability (mild). Cervix exhibits no motion tenderness and no discharge. Right adnexum displays no mass and no tenderness. Left adnexum displays no mass and no tenderness. No bleeding in the vagina. Vaginal discharge (small, thin, white) found.  Neurological: She is alert and oriented to person, place, and time.  Skin: Skin is warm and dry.  No erythema.  Psychiatric: She has a normal mood and affect.    Results for orders placed or performed during the hospital encounter of 06/06/17 (from the past 24 hour(s))  Urinalysis, Routine w reflex microscopic     Status: Abnormal   Collection Time: 06/06/17  8:30 AM  Result Value Ref Range   Color, Urine YELLOW YELLOW   APPearance HAZY (A) CLEAR   Specific Gravity, Urine 1.014 1.005 - 1.030   pH 5.0 5.0 - 8.0   Glucose, UA NEGATIVE NEGATIVE mg/dL   Hgb urine dipstick NEGATIVE NEGATIVE   Bilirubin Urine NEGATIVE NEGATIVE   Ketones, ur NEGATIVE NEGATIVE mg/dL   Protein, ur NEGATIVE NEGATIVE  mg/dL   Nitrite NEGATIVE NEGATIVE   Leukocytes, UA TRACE (A) NEGATIVE   RBC / HPF 0-5 0 - 5 RBC/hpf   WBC, UA 0-5 0 - 5 WBC/hpf   Bacteria, UA NONE SEEN NONE SEEN   Squamous Epithelial / LPF 6-30 (A) NONE SEEN  Pregnancy, urine POC     Status: None   Collection Time: 06/06/17  8:44 AM  Result Value Ref Range   Preg Test, Ur NEGATIVE NEGATIVE  Wet prep, genital     Status: Abnormal   Collection Time: 06/06/17  9:10 AM  Result Value Ref Range   Yeast Wet Prep HPF POC NONE SEEN NONE SEEN   Trich, Wet Prep NONE SEEN NONE SEEN   Clue Cells Wet Prep HPF POC PRESENT (A) NONE SEEN   WBC, Wet Prep HPF POC FEW (A) NONE SEEN   Sperm NONE SEEN     MAU Course  Procedures None  MDM UPT - negative UA, Wet prep, GC/Chlamydia, HSV culture, HIV and RPR today  Assessment and Plan  A: Vaginal irritation, possible cutaneous yeast Bacterial vaginosis  P: Discharge home Rx for Flagyl and Nystatin given  Continue Hydrocortisone cream as well for itching PRN Warning signs for worsening condition discussed Patient advised to follow-up with [facility]. They will call her with an appointment for an annual exam.  Patient may return to MAU as needed or if her condition were to change or worsen   Kerry Hough, PA-C 06/06/2017, 9:46 AM

## 2017-06-06 NOTE — Discharge Instructions (Signed)
Bacterial Vaginosis Bacterial vaginosis is an infection of the vagina. It happens when too many germs (bacteria) grow in the vagina. This infection puts you at risk for infections from sex (STIs). Treating this infection can lower your risk for some STIs. You should also treat this if you are pregnant. It can cause your baby to be born early. Follow these instructions at home: Medicines  Take over-the-counter and prescription medicines only as told by your doctor.  Take or use your antibiotic medicine as told by your doctor. Do not stop taking or using it even if you start to feel better. General instructions  If you your sexual partner is a woman, tell her that you have this infection. She needs to get treatment if she has symptoms. If you have a female partner, he does not need to be treated.  During treatment: ? Avoid sex. ? Do not douche. ? Avoid alcohol as told. ? Avoid breastfeeding as told.  Drink enough fluid to keep your pee (urine) clear or pale yellow.  Keep your vagina and butt (rectum) clean. ? Wash the area with warm water every day. ? Wipe from front to back after you use the toilet.  Keep all follow-up visits as told by your doctor. This is important. Preventing this condition  Do not douche.  Use only warm water to wash around your vagina.  Use protection when you have sex. This includes: ? Latex condoms. ? Dental dams.  Limit how many people you have sex with. It is best to only have sex with the same person (be monogamous).  Get tested for STIs. Have your partner get tested.  Wear underwear that is cotton or lined with cotton.  Avoid tight pants and pantyhose. This is most important in summer.  Do not use any products that have nicotine or tobacco in them. These include cigarettes and e-cigarettes. If you need help quitting, ask your doctor.  Do not use illegal drugs.  Limit how much alcohol you drink. Contact a doctor if:  Your symptoms do not get  better, even after you are treated.  You have more discharge or pain when you pee (urinate).  You have a fever.  You have pain in your belly (abdomen).  You have pain with sex.  Your bleed from your vagina between periods. Summary  This infection happens when too many germs (bacteria) grow in the vagina.  Treating this condition can lower your risk for some infections from sex (STIs).  You should also treat this if you are pregnant. It can cause early (premature) birth.  Do not stop taking or using your antibiotic medicine even if you start to feel better. This information is not intended to replace advice given to you by your health care provider. Make sure you discuss any questions you have with your health care provider. Document Released: 08/02/2008 Document Revised: 07/09/2016 Document Reviewed: 07/09/2016 Elsevier Interactive Patient Education  2017 [employer].  Skin Yeast Infection Skin yeast infection is a condition in which there is an overgrowth of yeast (candida) that normally lives on the skin. This condition usually occurs in areas of the skin that are constantly warm and moist, such as the armpits or the groin. What are the causes? This condition is caused by a change in the normal balance of the yeast and bacteria that live on the skin. What increases the risk? This condition is more likely to develop in:  People who are obese.  Pregnant women.  Women who  take birth control pills.  People who have diabetes.  People who take antibiotic medicines.  People who take steroid medicines.  People who are malnourished.  People who have a weak defense (immune) system.  People who are 36 years of age or older.  What are the signs or symptoms? Symptoms of this condition include:  A red, swollen area of the skin.  Bumps on the skin.  Itchiness.  How is this diagnosed? This condition is diagnosed with a medical history and physical exam. Your health care  provider may check for yeast by taking light scrapings of the skin to be viewed under a microscope. How is this treated? This condition is treated with medicine. Medicines may be prescribed or be available over-the-counter. The medicines may be:  Taken by mouth (orally).  Applied as a cream.  Follow these instructions at home:  Take or apply over-the-counter and prescription medicines only as told by your health care provider.  Eat more yogurt. This may help to keep your yeast infection from returning.  Maintain a healthy weight. If you need help losing weight, talk with your health care provider.  Keep your skin clean and dry.  If you have diabetes, keep your blood sugar under control. Contact a health care provider if:  Your symptoms go away and then return.  Your symptoms do not get better with treatment.  Your symptoms get worse.  Your rash spreads.  You have a fever or chills.  You have new symptoms.  You have new warmth or redness of your skin. This information is not intended to replace advice given to you by your health care provider. Make sure you discuss any questions you have with your health care provider. Document Released: 07/12/2011 Document Revised: 06/19/2016 Document Reviewed: 04/27/2015 Elsevier Interactive Patient Education  [employer].

## 2017-06-07 LAB — GC/CHLAMYDIA PROBE AMP (~~LOC~~) NOT AT ARMC
CHLAMYDIA, DNA PROBE: NEGATIVE
Neisseria Gonorrhea: NEGATIVE

## 2017-06-07 LAB — HIV ANTIBODY (ROUTINE TESTING W REFLEX): HIV SCREEN 4TH GENERATION: NONREACTIVE

## 2017-06-07 LAB — RPR: RPR: NONREACTIVE

## 2017-06-08 LAB — HSV CULTURE AND TYPING

## 2017-06-13 ENCOUNTER — Encounter: Payer: Self-pay | Admitting: General Practice

## 2017-06-13 ENCOUNTER — Telehealth: Payer: Self-pay | Admitting: General Practice

## 2017-06-13 NOTE — Telephone Encounter (Signed)
Attempted to call patient with New GYN appointment with [name], CNM on 07/05/17 at 2:00pm.  Unable to reach patient.  Letter has been mailed.

## 2017-07-05 ENCOUNTER — Other Ambulatory Visit (HOSPITAL_COMMUNITY)
Admission: RE | Admit: 2017-07-05 | Discharge: 2017-07-05 | Disposition: A | Discharge status: Home / Self Care | Payer: Medicaid Other | Source: Ambulatory Visit | Attending: Advanced Practice Midwife | Admitting: Advanced Practice Midwife

## 2017-07-05 ENCOUNTER — Ambulatory Visit (INDEPENDENT_AMBULATORY_CARE_PROVIDER_SITE_OTHER): Payer: Medicaid Other | Admitting: Advanced Practice Midwife

## 2017-07-05 ENCOUNTER — Encounter: Payer: Self-pay | Admitting: Advanced Practice Midwife

## 2017-07-05 VITALS — BP 117/76 | HR 82 | Ht 64.0 in | Wt 161.4 lb

## 2017-07-05 DIAGNOSIS — Z01419 Encounter for gynecological examination (general) (routine) without abnormal findings: Secondary | ICD-10-CM

## 2017-07-05 DIAGNOSIS — B379 Candidiasis, unspecified: Secondary | ICD-10-CM | POA: Insufficient documentation

## 2017-07-05 DIAGNOSIS — N939 Abnormal uterine and vaginal bleeding, unspecified: Secondary | ICD-10-CM

## 2017-07-05 DIAGNOSIS — N946 Dysmenorrhea, unspecified: Secondary | ICD-10-CM

## 2017-07-05 DIAGNOSIS — B373 Candidiasis of vulva and vagina: Secondary | ICD-10-CM

## 2017-07-05 DIAGNOSIS — Z0000 Encounter for general adult medical examination without abnormal findings: Secondary | ICD-10-CM

## 2017-07-05 DIAGNOSIS — B3731 Acute candidiasis of vulva and vagina: Secondary | ICD-10-CM

## 2017-07-05 DIAGNOSIS — L309 Dermatitis, unspecified: Secondary | ICD-10-CM

## 2017-07-05 DIAGNOSIS — L738 Other specified follicular disorders: Secondary | ICD-10-CM

## 2017-07-05 MED ORDER — FLUCONAZOLE 150 MG PO TABS: 150.0000 mg | ORAL_TABLET | Freq: Once | ORAL | 1 refills | Status: AC

## 2017-07-05 MED ORDER — CLOBETASOL PROPIONATE 0.05 % EX OINT: 1.0000 "application " | TOPICAL_OINTMENT | Freq: Two times a day (BID) | CUTANEOUS | 2 refills | Status: DC

## 2017-07-05 NOTE — Progress Notes (Signed)
Subjective:     Wendy Ross is a 41 y.o. female here for a routine annual exam.  Current complaints: periods have become heavier and more painful in the last year and she has itching and irritation of the left labial area that has not resolved with treatment for yeast or BV.     Gynecologic History Patient's last menstrual period was 06/07/2017 (exact date). Contraception: none Last Pap: Pt unsure. Results were: normal Last mammogram: n/a. Mammogram ordered, application for scholarship completed today.  Obstetric History OB History  Gravida Para Term Preterm AB Living  6 4 4   2 4   SAB TAB Ectopic Multiple Live Births    2          # Outcome Date GA Lbr Len/2nd Weight Sex Delivery Anes PTL Lv  6 TAB           5 TAB           4 Term           3 Term           2 Term           1 Term                The following portions of the patient's history were reviewed and updated as appropriate: allergies, current medications, past family history, past medical history, past social history, past surgical history and problem list.  Review of Systems Pertinent items are noted in HPI.    Objective:    BP 117/76   Pulse 82   Ht 5\' 4"  (1.626 m)   Wt 161 lb 6.4 oz (73.2 kg)   LMP 06/07/2017 (Exact Date)   BMI 27.70 kg/m   General Appearance:    Alert, cooperative, no distress, appears stated age  Head:    Normocephalic, without obvious abnormality, atraumatic  Eyes:    PERRL, conjunctiva/corneas clear, EOM's intact, fundi    benign, both eyes  Ears:    Normal TM's and external ear canals, both ears  Nose:   Nares normal, septum midline, mucosa normal, no drainage    or sinus tenderness  Throat:   Lips, mucosa, and tongue normal; teeth and gums normal  Neck:   Supple, symmetrical, trachea midline, no adenopathy;    thyroid:  no enlargement/tenderness/nodules; no carotid   bruit or JVD  Back:     Symmetric, no curvature, ROM normal, no CVA tenderness  Lungs:     Clear to  auscultation bilaterally, respirations unlabored  Chest Wall:    No tenderness or deformity   Heart:    Regular rate and rhythm, S1 and S2 normal, no murmur, rub   or gallop  Breast Exam:    No tenderness, masses, or nipple abnormality  Abdomen:     Soft, non-tender, bowel sounds active all four quadrants,    no masses, no organomegaly  Genitalia:    Pelvic exam: Cervix pink, visually closed, without lesion, scant white creamy discharge, vaginal walls  Normal, small 1x3 cm patch of skin on posterior left labia/perineum that is flaky and skin tone slightly lighter than surrounding area Bimanual exam: Cervix 0/long/high, firm, anterior, neg CMT, uterus nontender,slightly enlarged, adnexa without tenderness, enlargement, or mass  Rectal:    Normal tone, normal prostate, no masses or tenderness;   guaiac negative stool  Extremities:   Extremities normal, atraumatic, no cyanosis or edema  Pulses:   2+ and symmetric all extremities  Skin:   Skin  color, texture, turgor normal, no rashes or lesions  Lymph nodes:   Cervical, supraclavicular, and axillary nodes normal  Neurologic:   CNII-XII intact, normal strength, sensation and reflexes    throughout      Assessment:   1. Women's annual routine gynecological examination - Cytology - PAP  2. Abnormal uterine bleeding (AUB) --Likely perimenopausal but history and presentation suspicious for fibroids. - US Pelvis Complete; Future - US Transvaginal Non-OB; Future  3. Dysmenorrhea --Ibuprofen OTC - US Pelvis Complete; Future - US Transvaginal Non-OB; Future  4. Vaginal yeast infection  - fluconazole (DIFLUCAN) 150 MG tablet; Take 1 tablet (150 mg total) by mouth once.  Dispense: 1 tablet; Refill: 1  5. Follicular eczema --Will treat pt vaginal irritation with Diflucan but also add topical therapy for irritated skin area c/w dermatitis/eczema.   - clobetasol ointment (TEMOVATE) 0.05 %; Apply 1 application topically 2 (two) times daily. Apply  to affected area  Dispense: 30 g; Refill: 2    Plan:    Mammogram ordered. Follow up in: 2 months.

## 2017-07-05 NOTE — Patient Instructions (Signed)
Abnormal Uterine Bleeding Abnormal uterine bleeding can affect women at various stages in life, including teenagers, women in their reproductive years, pregnant women, and women who have reached menopause. Several kinds of uterine bleeding are considered abnormal, including:  Bleeding or spotting between periods.  Bleeding after sexual intercourse.  Bleeding that is heavier or more than normal.  Periods that last longer than usual.  Bleeding after menopause. Many cases of abnormal uterine bleeding are minor and simple to treat, while others are more serious. Any type of abnormal bleeding should be evaluated by your health care provider. Treatment will depend on the cause of the bleeding. Follow these instructions at home: Monitor your condition for any changes. The following actions may help to alleviate any discomfort you are experiencing:  Avoid the use of tampons and douches as directed by your health care provider.  Change your pads frequently. You should get regular pelvic exams and Pap tests. Keep all follow-up appointments for diagnostic tests as directed by your health care provider. Contact a health care provider if:  Your bleeding lasts more than 1 week.  You feel dizzy at times. Get help right away if:  You pass out.  You are changing pads every 15 to 30 minutes.  You have abdominal pain.  You have a fever.  You become sweaty or weak.  You are passing large blood clots from the vagina.  You start to feel nauseous and vomit. This information is not intended to replace advice given to you by your health care provider. Make sure you discuss any questions you have with your health care provider. Document Released: 10/24/2005 Document Revised: 04/06/2016 Document Reviewed: 05/23/2013 Elsevier Interactive Patient Education  2017 [employer]  

## 2017-07-05 NOTE — Progress Notes (Signed)
Patient here today for Annual GYN visit.

## 2017-07-07 LAB — CYTOLOGY - PAP
Diagnosis: NEGATIVE
HPV (WINDOPATH): NOT DETECTED

## 2017-07-13 ENCOUNTER — Ambulatory Visit (HOSPITAL_COMMUNITY): Admission: RE | Admit: 2017-07-13 | Payer: Self-pay | Source: Ambulatory Visit

## 2017-08-29 ENCOUNTER — Ambulatory Visit: Payer: Medicaid Other | Admitting: Advanced Practice Midwife

## 2017-08-29 ENCOUNTER — Encounter: Payer: Self-pay | Admitting: *Deleted

## 2017-08-29 NOTE — Progress Notes (Signed)
Wendy Ross did not keep her scheduled appointment for follow up. Per discussion with [name], CNM, does not need to be called, may reschedule if she calls.

## 2017-10-22 ENCOUNTER — Inpatient Hospital Stay (HOSPITAL_COMMUNITY)
Admission: AD | Admit: 2017-10-22 | Discharge: 2017-10-22 | Disposition: A | Discharge status: Home / Self Care | Payer: Medicaid Other | Source: Ambulatory Visit | Attending: Obstetrics and Gynecology | Admitting: Obstetrics and Gynecology

## 2017-10-22 ENCOUNTER — Encounter (HOSPITAL_COMMUNITY): Payer: Self-pay | Admitting: *Deleted

## 2017-10-22 ENCOUNTER — Other Ambulatory Visit: Payer: Self-pay

## 2017-10-22 DIAGNOSIS — Z885 Allergy status to narcotic agent status: Secondary | ICD-10-CM | POA: Insufficient documentation

## 2017-10-22 DIAGNOSIS — N750 Cyst of Bartholin's gland: Secondary | ICD-10-CM

## 2017-10-22 DIAGNOSIS — N898 Other specified noninflammatory disorders of vagina: Secondary | ICD-10-CM | POA: Insufficient documentation

## 2017-10-22 DIAGNOSIS — F17210 Nicotine dependence, cigarettes, uncomplicated: Secondary | ICD-10-CM | POA: Insufficient documentation

## 2017-10-22 DIAGNOSIS — Z3202 Encounter for pregnancy test, result negative: Secondary | ICD-10-CM | POA: Insufficient documentation

## 2017-10-22 LAB — URINALYSIS, ROUTINE W REFLEX MICROSCOPIC
BACTERIA UA: NONE SEEN
Bilirubin Urine: NEGATIVE
Glucose, UA: NEGATIVE mg/dL
Hgb urine dipstick: NEGATIVE
KETONES UR: NEGATIVE mg/dL
Nitrite: NEGATIVE
PROTEIN: NEGATIVE mg/dL
Specific Gravity, Urine: 1.016 (ref 1.005–1.030)
pH: 5 (ref 5.0–8.0)

## 2017-10-22 LAB — POCT PREGNANCY, URINE: PREG TEST UR: NEGATIVE

## 2017-10-22 LAB — WET PREP, GENITAL
Clue Cells Wet Prep HPF POC: NONE SEEN
Sperm: NONE SEEN
Trich, Wet Prep: NONE SEEN
Yeast Wet Prep HPF POC: NONE SEEN

## 2017-10-22 MED ORDER — CEPHALEXIN 500 MG PO CAPS: 500.0000 mg | ORAL_CAPSULE | Freq: Four times a day (QID) | ORAL | 2 refills | Status: DC

## 2017-10-22 MED ORDER — FLUCONAZOLE 150 MG PO TABS: 150.0000 mg | ORAL_TABLET | Freq: Once | ORAL | 0 refills | Status: AC

## 2017-10-22 NOTE — Discharge Instructions (Signed)
Bartholin Cyst or Abscess A Bartholin cyst is a fluid-filled sac that forms on a Bartholin gland. Bartholin glands are small glands that are located within the folds of skin (labia) along the sides of the lower opening of the vagina. These glands produce a fluid to moisten the outside of the vagina during sexual intercourse. A Bartholin cyst causes a bulge on the side of the vagina. A cyst that is not large or infected may not cause symptoms or problems. However, if the fluid within the cyst becomes infected, the cyst can turn into an abscess. An abscess may cause discomfort or pain. What are the causes? A Bartholin cyst may develop when the duct of the gland becomes blocked. In many cases, the cause of this is not known. Various kinds of bacteria can cause the cyst to become infected and develop into an abscess. What increases the risk? You may be at an increased risk of developing a Bartholin cyst or abscess if:  You are a woman of reproductive age.  You have a history of previous Bartholin cysts or abscesses.  You have diabetes.  You have a sexually transmitted disease (STD).  What are the signs or symptoms? The severity of symptoms varies depending on the size of the cyst and whether it is infected. Symptoms may include:  A bulge or swelling near the lower opening of your vagina.  Discomfort or pain.  Redness.  Pain during sexual intercourse.  Pain when walking.  Fluid draining from the area.  How is this diagnosed? Your health care provider may make a diagnosis based on your symptoms and a physical exam. He or she will look for swelling in your vaginal area. Blood tests may be done to check for infections. A sample of fluid from the cyst or abscess may also be taken to be tested in a lab. How is this treated? Small cysts that are not infected may not require any treatment. These often go away on their own. Yourhealth care provider will recommend hot baths and the use of warm  compresses. These may also be part of the treatment for an abscess. Treatment options for a large cyst or abscess may include:  Antibiotic medicine.  A surgical procedure to drain the abscess. One of the following procedures may be done: ? Incision and drainage. An incision is made in the cyst or abscess so that the fluid drains out. A catheter may be placed inside the cyst so that it does not close and fill up with fluid again. The catheter will be removed after you have a follow-up visit with a specialist (gynecologist). ? Marsupialization. The cyst or abscess is opened and kept open by stitching the edges of the skin to the walls of the cyst or abscess. This allows it to continue to drain and not fill up with fluid again.  If you have cysts or abscesses that keep returning and have required incision and drainage multiple times, your health care provider may talk to you about surgery to remove the Bartholin gland. Follow these instructions at home:  Take medicines only as directed by your health care provider.  If you were prescribed an antibiotic medicine, finish it all even if you start to feel better.  Apply warm, wet compresses to the area or take warm, shallow baths that cover your pelvic region (sitz baths) several times a day or as directed by your health care provider.  Do not squeeze the cyst or apply heavy pressure to it.    Do not have sexual intercourse until the cyst has gone away.  If your cyst or abscess was opened, a small piece of gauze or a drain may have been placed in the area to allow drainage. Do not remove the gauze or the drain until directed by your health care provider.  Wear feminine pads-not tampons-as needed for any drainage or bleeding.  Keep all follow-up visits as directed by your health care provider. This is important. How is this prevented? Take these steps to help prevent a Bartholin cyst from returning:  Practice good hygiene.  Clean your vaginal  area with mild soap and a soft cloth when you bathe.  Practice safe sex to prevent STDs.  Contact a health care provider if:  You have increased pain, swelling, or redness in the area of the cyst.  Puslike drainage is coming from the cyst.  You have a fever. This information is not intended to replace advice given to you by your health care provider. Make sure you discuss any questions you have with your health care provider. Document Released: 10/24/2005 Document Revised: 03/31/2016 Document Reviewed: 06/09/2014 Elsevier Interactive Patient Education  2018 [employer]  

## 2017-10-22 NOTE — MAU Provider Note (Signed)
History     CSN: [id number]  Arrival date and time: 10/22/17 3220   First Provider Initiated Contact with Patient 10/22/17 [phone]      Chief Complaint  Patient presents with  . Vaginal Discharge   41 y.o. Non-pregnant female here with vaginal pain and discharge. Vaginal discharge started 1 month ago. She describes as thin and clear and "just falls out of me". Causes dry skin and irritation. No odor. Uses multiple panty liners daily. No new partner. Remote hx of STDs. Vaginal pain started 2 days ago after she noticed "a knot" on the left side. No new skin products. Has not used anything for it.    RN Note: Pt reports a "knot" in her vaginal that "feels like it is pushing out". Pt noticed this on Friday. Pt also having vaginal discharge since August. Pt was seen a couple of times in the clinic and was treated for BV and yeast, but discharge is persisting.     Past Medical History:  Diagnosis Date  . Asthma   . Hypercholesteremia   . Vaginal Pap smear, abnormal     Past Surgical History:  Procedure Laterality Date  . TUBAL LIGATION    . WISDOM TOOTH EXTRACTION      Family History  Problem Relation Age of Onset  . Hypertension Mother   . Hypertension Other     Social History   Tobacco Use  . Smoking status: Current Every Day Smoker    Packs/day: 0.25    Types: Cigarettes  . Smokeless tobacco: Never Used  Substance Use Topics  . Alcohol use: Yes    Comment: social  . Drug use: No    Allergies:  Allergies  Allergen Reactions  . Percocet [Oxycodone-Acetaminophen] Nausea Only    Medications Prior to Admission  Medication Sig Dispense Refill Last Dose  . clobetasol ointment (TEMOVATE) 7.06 % Apply 1 application topically 2 (two) times daily. Apply to affected area 30 g 2   . metroNIDAZOLE (FLAGYL) 500 MG tablet Take 1 tablet (500 mg total) by mouth 2 (two) times daily. 14 tablet 0 Taking  . naproxen (NAPROSYN) 500 MG tablet Take 1 tablet (500 mg total) by mouth 2  (two) times daily. 14 tablet 0 Taking  . nystatin cream (MYCOSTATIN) Apply to affected area 2 times daily 15 g 0 Taking  . tetrahydrozoline 0.05 % ophthalmic solution Place 1 drop into both eyes daily as needed (eye irritation).   Taking    Review of Systems  Gastrointestinal: Negative for abdominal pain.  Genitourinary: Positive for vaginal discharge and vaginal pain. Negative for pelvic pain.   Physical Exam   Blood pressure 117/74, pulse (!) 101, temperature 99 F (37.2 C), resp. rate 18, last menstrual period 09/28/2017.  Physical Exam  Constitutional: She is oriented to person, place, and time. She appears well-developed and well-nourished. No distress.  HENT:  Head: Normocephalic and atraumatic.  Neck: Normal range of motion.  Respiratory: Effort normal. No respiratory distress.  Genitourinary:  Genitourinary Comments: External: no lesions or erythema, 1.5cm bartholin cyst palpated on left, no edema, erythema, or drainage, +tender Vagina: rugated, pink, moist, small thin white discharge   Musculoskeletal: Normal range of motion.  Neurological: She is alert and oriented to person, place, and time.  Skin: Skin is warm and dry.  Psychiatric: She has a normal mood and affect.   Results for orders placed or performed during the hospital encounter of 10/22/17 (from the past 24 hour(s))  Urinalysis, Routine w reflex  microscopic     Status: Abnormal   Collection Time: 10/22/17  7:04 AM  Result Value Ref Range   Color, Urine YELLOW YELLOW   APPearance HAZY (A) CLEAR   Specific Gravity, Urine 1.016 1.005 - 1.030   pH 5.0 5.0 - 8.0   Glucose, UA NEGATIVE NEGATIVE mg/dL   Hgb urine dipstick NEGATIVE NEGATIVE   Bilirubin Urine NEGATIVE NEGATIVE   Ketones, ur NEGATIVE NEGATIVE mg/dL   Protein, ur NEGATIVE NEGATIVE mg/dL   Nitrite NEGATIVE NEGATIVE   Leukocytes, UA TRACE (A) NEGATIVE   RBC / HPF 0-5 0 - 5 RBC/hpf   WBC, UA 0-5 0 - 5 WBC/hpf   Bacteria, UA NONE SEEN NONE SEEN    Squamous Epithelial / LPF 0-5 (A) NONE SEEN   Mucus PRESENT   Pregnancy, urine POC     Status: None   Collection Time: 10/22/17  7:13 AM  Result Value Ref Range   Preg Test, Ur NEGATIVE NEGATIVE  Microscopic wet-mount exam shows negative for pathogens, normal epithelial cells.  MAU Course  Procedures  MDM Labs ordered. Transfer of care given to Leslye Peer, CNM  10/22/2017 8:11 AM    Assessment and Plan   Discussed wet prep is negative DIscussed Bartholins normal process of development. May spontaneously improve Discussed Treatment if it enlarges and worsens, including I&D and Word catheter Will Rx Keflex to see if that plus warm soaks help resolve it Rx Diflucan in case of yeast infection  Followup in clinic  Encouraged to return here or to other Urgent Care/ED if she develops worsening of symptoms, increase in pain, fever, or other concerning symptoms.    Seabron Spates, CNM

## 2017-10-22 NOTE — MAU Note (Signed)
Pt reports a "knot" in her vaginal that "feels like it is pushing out". Pt noticed this on Friday. Pt also having vaginal discharge since August. Pt was seen a couple of times in the clinic and was treated for BV and yeast, but discharge is persisting.

## 2017-10-23 LAB — GC/CHLAMYDIA PROBE AMP (~~LOC~~) NOT AT ARMC
CHLAMYDIA, DNA PROBE: NEGATIVE
NEISSERIA GONORRHEA: NEGATIVE

## 2017-11-13 ENCOUNTER — Encounter (HOSPITAL_COMMUNITY): Payer: Self-pay | Admitting: *Deleted

## 2017-11-13 ENCOUNTER — Inpatient Hospital Stay (HOSPITAL_COMMUNITY)
Admission: AD | Admit: 2017-11-13 | Discharge: 2017-11-13 | Disposition: A | Discharge status: Home / Self Care | Payer: Medicaid Other | Source: Ambulatory Visit | Attending: Obstetrics and Gynecology | Admitting: Obstetrics and Gynecology

## 2017-11-13 ENCOUNTER — Other Ambulatory Visit: Payer: Self-pay

## 2017-11-13 DIAGNOSIS — Z885 Allergy status to narcotic agent status: Secondary | ICD-10-CM | POA: Insufficient documentation

## 2017-11-13 DIAGNOSIS — N751 Abscess of Bartholin's gland: Secondary | ICD-10-CM | POA: Insufficient documentation

## 2017-11-13 DIAGNOSIS — Z8249 Family history of ischemic heart disease and other diseases of the circulatory system: Secondary | ICD-10-CM | POA: Insufficient documentation

## 2017-11-13 DIAGNOSIS — Z9851 Tubal ligation status: Secondary | ICD-10-CM | POA: Insufficient documentation

## 2017-11-13 DIAGNOSIS — J45909 Unspecified asthma, uncomplicated: Secondary | ICD-10-CM | POA: Insufficient documentation

## 2017-11-13 DIAGNOSIS — E7800 Pure hypercholesterolemia, unspecified: Secondary | ICD-10-CM | POA: Insufficient documentation

## 2017-11-13 DIAGNOSIS — F17210 Nicotine dependence, cigarettes, uncomplicated: Secondary | ICD-10-CM | POA: Insufficient documentation

## 2017-11-13 DIAGNOSIS — Z9889 Other specified postprocedural states: Secondary | ICD-10-CM | POA: Insufficient documentation

## 2017-11-13 DIAGNOSIS — N750 Cyst of Bartholin's gland: Secondary | ICD-10-CM

## 2017-11-13 DIAGNOSIS — Z3202 Encounter for pregnancy test, result negative: Secondary | ICD-10-CM | POA: Insufficient documentation

## 2017-11-13 LAB — URINALYSIS, ROUTINE W REFLEX MICROSCOPIC
BILIRUBIN URINE: NEGATIVE
Glucose, UA: NEGATIVE mg/dL
Hgb urine dipstick: NEGATIVE
KETONES UR: NEGATIVE mg/dL
Leukocytes, UA: NEGATIVE
Nitrite: NEGATIVE
PROTEIN: NEGATIVE mg/dL
Specific Gravity, Urine: 1.001 — ABNORMAL LOW (ref 1.005–1.030)
pH: 7 (ref 5.0–8.0)

## 2017-11-13 LAB — POCT PREGNANCY, URINE: PREG TEST UR: NEGATIVE

## 2017-11-13 MED ORDER — IBUPROFEN 600 MG PO TABS
600.0000 mg | ORAL_TABLET | Freq: Once | ORAL | Status: AC
  Administered 2017-11-13: 600 mg via ORAL
  Filled 2017-11-13: qty 1

## 2017-11-13 MED ORDER — TRAMADOL HCL 50 MG PO TABS
50.0000 mg | ORAL_TABLET | Freq: Once | ORAL | Status: AC
  Administered 2017-11-13: 50 mg via ORAL
  Filled 2017-11-13: qty 1

## 2017-11-13 MED ORDER — TRAMADOL HCL 50 MG PO TABS: 50.0000 mg | ORAL_TABLET | Freq: Once | ORAL | Status: DC

## 2017-11-13 MED ORDER — TRAMADOL HCL 50 MG PO TABS: 50.0000 mg | ORAL_TABLET | Freq: Four times a day (QID) | ORAL | 0 refills | Status: DC | PRN

## 2017-11-13 MED ORDER — SULFAMETHOXAZOLE-TRIMETHOPRIM 800-160 MG PO TABS
1.0000 | ORAL_TABLET | Freq: Two times a day (BID) | ORAL | 0 refills | Status: DC
Start: 2017-11-13 — End: 2020-11-25

## 2017-11-13 NOTE — Discharge Instructions (Signed)
Bartholin Cyst or Abscess A Bartholin cyst is a fluid-filled sac that forms on a Bartholin gland. Bartholin glands are small glands that are located within the folds of skin (labia) along the sides of the lower opening of the vagina. These glands produce a fluid to moisten the outside of the vagina during sexual intercourse. A Bartholin cyst causes a bulge on the side of the vagina. A cyst that is not large or infected may not cause symptoms or problems. However, if the fluid within the cyst becomes infected, the cyst can turn into an abscess. An abscess may cause discomfort or pain. What are the causes? A Bartholin cyst may develop when the duct of the gland becomes blocked. In many cases, the cause of this is not known. Various kinds of bacteria can cause the cyst to become infected and develop into an abscess. What increases the risk? You may be at an increased risk of developing a Bartholin cyst or abscess if:  You are a woman of reproductive age.  You have a history of previous Bartholin cysts or abscesses.  You have diabetes.  You have a sexually transmitted disease (STD).  What are the signs or symptoms? The severity of symptoms varies depending on the size of the cyst and whether it is infected. Symptoms may include:  A bulge or swelling near the lower opening of your vagina.  Discomfort or pain.  Redness.  Pain during sexual intercourse.  Pain when walking.  Fluid draining from the area.  How is this diagnosed? Your health care provider may make a diagnosis based on your symptoms and a physical exam. He or she will look for swelling in your vaginal area. Blood tests may be done to check for infections. A sample of fluid from the cyst or abscess may also be taken to be tested in a lab. How is this treated? Small cysts that are not infected may not require any treatment. These often go away on their own. Yourhealth care provider will recommend hot baths and the use of warm  compresses. These may also be part of the treatment for an abscess. Treatment options for a large cyst or abscess may include:  Antibiotic medicine.  A surgical procedure to drain the abscess. One of the following procedures may be done: ? Incision and drainage. An incision is made in the cyst or abscess so that the fluid drains out. A catheter may be placed inside the cyst so that it does not close and fill up with fluid again. The catheter will be removed after you have a follow-up visit with a specialist (gynecologist). ? Marsupialization. The cyst or abscess is opened and kept open by stitching the edges of the skin to the walls of the cyst or abscess. This allows it to continue to drain and not fill up with fluid again.  If you have cysts or abscesses that keep returning and have required incision and drainage multiple times, your health care provider may talk to you about surgery to remove the Bartholin gland. Follow these instructions at home:  Take medicines only as directed by your health care provider.  If you were prescribed an antibiotic medicine, finish it all even if you start to feel better.  Apply warm, wet compresses to the area or take warm, shallow baths that cover your pelvic region (sitz baths) several times a day or as directed by your health care provider.  Do not squeeze the cyst or apply heavy pressure to it.    Do not have sexual intercourse until the cyst has gone away.  If your cyst or abscess was opened, a small piece of gauze or a drain may have been placed in the area to allow drainage. Do not remove the gauze or the drain until directed by your health care provider.  Wear feminine pads-not tampons-as needed for any drainage or bleeding.  Keep all follow-up visits as directed by your health care provider. This is important. How is this prevented? Take these steps to help prevent a Bartholin cyst from returning:  Practice good hygiene.  Clean your vaginal  area with mild soap and a soft cloth when you bathe.  Practice safe sex to prevent STDs.  Contact a health care provider if:  You have increased pain, swelling, or redness in the area of the cyst.  Puslike drainage is coming from the cyst.  You have a fever. This information is not intended to replace advice given to you by your health care provider. Make sure you discuss any questions you have with your health care provider. Document Released: 10/24/2005 Document Revised: 03/31/2016 Document Reviewed: 06/09/2014 Elsevier Interactive Patient Education  2018 [employer]  

## 2017-11-13 NOTE — MAU Note (Signed)
Came in on the 16th, dx with a bartholin's cyst.  Took the antibiotic and did the sitz baths, it has doubled in size.  The medicine didn't do anything and she can not live like this.

## 2017-11-13 NOTE — MAU Note (Signed)
Pt presents with c/o Bartolins cyst that has increased in size and causing more pain.  States was seen on 10/22/17 and no relief from previous visit

## 2017-11-13 NOTE — Progress Notes (Signed)
Dr. Rosana Hoes into eval cyst per M. Jimmye Norman, CNM request

## 2017-11-13 NOTE — MAU Provider Note (Signed)
Chief Complaint:  batholin's   First Provider Initiated Contact with Patient 11/13/17 [phone]       HPI: Wendy Ross is a 42 y.o. [id number] who presents to maternity admissions reporting increased pain and size of a previously noted Left Bartholins cyst.. I saw her in mid-December and cyst was not fluctuant.  Given Keflex and instructions for warm soaks.  It has since gotten worse She reports no vaginal bleeding, vaginal itching/burning, urinary symptoms, h/a, dizziness, n/v, or fever/chills.    Other  This is a recurrent problem. The current episode started 1 to 4 weeks ago. The problem occurs constantly. The problem has been gradually worsening. Pertinent negatives include no abdominal pain, chills, fever, myalgias, nausea, urinary symptoms or vomiting. Exacerbated by: pressure on area. Treatments tried: antibiotic and tylenol. The treatment provided no relief.    RN Note: Came in on the 16th, dx with a bartholin's cyst.  Took the antibiotic and did the sitz baths, it has doubled in size.  The medicine didn't do anything and she can not live like this.    Past Medical History: Past Medical History:  Diagnosis Date  . Asthma   . Hypercholesteremia   . Vaginal Pap smear, abnormal     Past obstetric history: OB History  Gravida Para Term Preterm AB Living  6 4 4   2 4   SAB TAB Ectopic Multiple Live Births    2          # Outcome Date GA Lbr Len/2nd Weight Sex Delivery Anes PTL Lv  6 TAB           5 TAB           4 Term           3 Term           2 Term           1 Term               Past Surgical History: Past Surgical History:  Procedure Laterality Date  . TUBAL LIGATION    . WISDOM TOOTH EXTRACTION      Family History: Family History  Problem Relation Age of Onset  . Hypertension Mother   . Hypertension Other     Social History: Social History   Tobacco Use  . Smoking status: Current Every Day Smoker    Packs/day: 0.25    Types: Cigarettes  . Smokeless  tobacco: Never Used  Substance Use Topics  . Alcohol use: Yes    Comment: social  . Drug use: No    Allergies:  Allergies  Allergen Reactions  . Percocet [Oxycodone-Acetaminophen] Nausea Only    Meds:  Medications Prior to Admission  Medication Sig Dispense Refill Last Dose  . cephALEXin (KEFLEX) 500 MG capsule Take 1 capsule (500 mg total) by mouth 4 (four) times daily. 28 capsule 2   . clobetasol ointment (TEMOVATE) 9.81 % Apply 1 application topically 2 (two) times daily. Apply to affected area 30 g 2   . metroNIDAZOLE (FLAGYL) 500 MG tablet Take 1 tablet (500 mg total) by mouth 2 (two) times daily. 14 tablet 0 Taking  . nystatin cream (MYCOSTATIN) Apply to affected area 2 times daily 15 g 0 Taking    I have reviewed patient's Past Medical Hx, Surgical Hx, Family Hx, Social Hx, medications and allergies.  ROS:  Review of Systems  Constitutional: Negative for chills and fever.  Gastrointestinal: Negative for abdominal pain, nausea and  vomiting.  Musculoskeletal: Negative for myalgias.   Other systems negative     Physical Exam   Patient Vitals for the past 24 hrs:  BP Temp Temp src Pulse Resp SpO2 Weight  11/13/17 0824 115/60 98.6 F (37 C) Oral 78 16 100 % 166 lb 12 oz (75.6 kg)   Constitutional: Well-developed, well-nourished female in no acute distress.  Cardiovascular: normal rate and rhythm, no ectopy audible, S1 & S2 heard, no murmur Respiratory: normal effort, no distress. Lungs CTAB with no wheezes or crackles GI: Abd soft, non-tender.  Nondistended.  No rebound, No guarding.  Bowel Sounds audible  MS: Extremities nontender, no edema, normal ROM Neurologic: Alert and oriented x 4.   Grossly nonfocal. GU: Neg CVAT. Skin:  Warm and Dry Psych:  Affect appropriate.  PELVIC EXAM: Left Bartholins cyst, moderately fluctuant, about 2-3cm    Labs: Results for orders placed or performed during the hospital encounter of 11/13/17 (from the past 24 hour(s))   Urinalysis, Routine w reflex microscopic     Status: Abnormal   Collection Time: 11/13/17  8:49 AM  Result Value Ref Range   Color, Urine COLORLESS (A) YELLOW   APPearance CLEAR CLEAR   Specific Gravity, Urine 1.001 (L) 1.005 - 1.030   pH 7.0 5.0 - 8.0   Glucose, UA NEGATIVE NEGATIVE mg/dL   Hgb urine dipstick NEGATIVE NEGATIVE   Bilirubin Urine NEGATIVE NEGATIVE   Ketones, ur NEGATIVE NEGATIVE mg/dL   Protein, ur NEGATIVE NEGATIVE mg/dL   Nitrite NEGATIVE NEGATIVE   Leukocytes, UA NEGATIVE NEGATIVE  Pregnancy, urine POC     Status: None   Collection Time: 11/13/17  8:56 AM  Result Value Ref Range   Preg Test, Ur NEGATIVE NEGATIVE    Imaging:  No results found.  MAU Course/MDM: I have ordered labs as follows: see above Imaging ordered: none Results reviewed.  Premedicated with ibuprofen and tramadol.  Bartholin Cyst I&D and Ward Catheter Placement Enlarged abscess palpated in front of the hymenal ring around 5 o' clock.  Written informed consent was obtained.  Discussed complications and possible outcomes of procedure including recurrence of cyst, scarring leading to infecton, bleeding, dyspareunia, distortion of anatomy.  Patient was examined in the dorsal lithotomy position and mass was identified.  The area was prepped with Iodine and draped in a sterile manner. 1% Lidocaine (3 ml) was then used to infiltrate area on top of the cyst, behind the hymenal ring.  A 7 mm incision was made using a sterile scapel. Upon palpation of the mass, no fluid was expressed.  A hemostat was used to break up loculations, which resulted in no fluid.   I started the procedure and Dr Rosana Hoes stepped in and tried again to I&D.  Unable to get any fluid out.   She used Silver nitrate to cauterize bleeders at incision.  Patient tolerated the procedure well. - Bactrim DS bid x 10 days for treatment - Recommended Sitz baths bid and Motrin and Tramadol was given  prn pain.   She was told to call to be  examined if she experiences increasing swelling, pain, vaginal discharge, or fever.  - She was instructed to wear a peripad to absorb discharge, and to maintain pelvic rest.  - She will need an appointment [facility] 1 week with Dr Rosana Hoes to reexamine.  .     Pt stable at time of discharge.  Assessment: Recurrent Bartholins abscess  Plan: Discharge home Recommend warm soaks Rx Septra DS for  antibiotic coverage Rx sent for Tramadol for pain Appt made for next Monday to reexamine  Encouraged to return here or to other Urgent Care/ED if she develops worsening of symptoms, increase in pain, fever, or other concerning symptoms.   Hansel Feinstein CNM, MSN Certified Nurse-Midwife 11/13/2017 8:35 AM

## 2017-11-20 ENCOUNTER — Ambulatory Visit: Payer: Self-pay | Admitting: Obstetrics & Gynecology

## 2018-02-05 ENCOUNTER — Other Ambulatory Visit: Payer: Self-pay

## 2018-02-05 ENCOUNTER — Encounter (HOSPITAL_COMMUNITY): Payer: Self-pay | Admitting: Emergency Medicine

## 2018-02-05 ENCOUNTER — Emergency Department (HOSPITAL_COMMUNITY)
Admission: EM | Admit: 2018-02-05 | Discharge: 2018-02-05 | Disposition: A | Discharge status: Home / Self Care | Payer: Self-pay | Attending: Emergency Medicine | Admitting: Emergency Medicine

## 2018-02-05 DIAGNOSIS — R109 Unspecified abdominal pain: Secondary | ICD-10-CM

## 2018-02-05 DIAGNOSIS — Z79899 Other long term (current) drug therapy: Secondary | ICD-10-CM | POA: Insufficient documentation

## 2018-02-05 DIAGNOSIS — J45909 Unspecified asthma, uncomplicated: Secondary | ICD-10-CM | POA: Insufficient documentation

## 2018-02-05 DIAGNOSIS — R1084 Generalized abdominal pain: Secondary | ICD-10-CM | POA: Insufficient documentation

## 2018-02-05 DIAGNOSIS — D649 Anemia, unspecified: Secondary | ICD-10-CM | POA: Insufficient documentation

## 2018-02-05 DIAGNOSIS — F17210 Nicotine dependence, cigarettes, uncomplicated: Secondary | ICD-10-CM | POA: Insufficient documentation

## 2018-02-05 LAB — COMPREHENSIVE METABOLIC PANEL
ALBUMIN: 3.4 g/dL — AB (ref 3.5–5.0)
ALT: 11 U/L — AB (ref 14–54)
AST: 17 U/L (ref 15–41)
Alkaline Phosphatase: 39 U/L (ref 38–126)
Anion gap: 8 (ref 5–15)
BILIRUBIN TOTAL: 0.4 mg/dL (ref 0.3–1.2)
BUN: 9 mg/dL (ref 6–20)
CO2: 21 mmol/L — ABNORMAL LOW (ref 22–32)
CREATININE: 0.64 mg/dL (ref 0.44–1.00)
Calcium: 8.5 mg/dL — ABNORMAL LOW (ref 8.9–10.3)
Chloride: 108 mmol/L (ref 101–111)
GFR calc Af Amer: >60 mL/min (ref >60)
GFR calc non Af Amer: >60 mL/min (ref >60)
GLUCOSE: 94 mg/dL (ref 65–99)
POTASSIUM: 3.6 mmol/L (ref 3.5–5.1)
Sodium: 137 mmol/L (ref 135–145)
Total Protein: 6.5 g/dL (ref 6.5–8.1)

## 2018-02-05 LAB — CBC
HEMATOCRIT: 23 % — AB (ref 36.0–46.0)
Hemoglobin: 6.2 g/dL — CL (ref 12.0–15.0)
MCH: 18.5 pg — ABNORMAL LOW (ref 26.0–34.0)
MCHC: 27 g/dL — AB (ref 30.0–36.0)
MCV: 68.7 fL — AB (ref 78.0–100.0)
Platelets: 247 10*3/uL (ref 150–400)
RBC: 3.35 MIL/uL — ABNORMAL LOW (ref 3.87–5.11)
RDW: 21.1 % — AB (ref 11.5–15.5)
WBC: 5.3 10*3/uL (ref 4.0–10.5)

## 2018-02-05 LAB — URINALYSIS, ROUTINE W REFLEX MICROSCOPIC
Bilirubin Urine: NEGATIVE
Glucose, UA: NEGATIVE mg/dL
Ketones, ur: NEGATIVE mg/dL
Leukocytes, UA: NEGATIVE
NITRITE: NEGATIVE
PH: 6 (ref 5.0–8.0)
Protein, ur: NEGATIVE mg/dL
SPECIFIC GRAVITY, URINE: 1.016 (ref 1.005–1.030)

## 2018-02-05 LAB — POC OCCULT BLOOD, ED: FECAL OCCULT BLD: NEGATIVE

## 2018-02-05 LAB — I-STAT BETA HCG BLOOD, ED (MC, WL, AP ONLY): I-stat hCG, quantitative: <5 m[IU]/mL (ref <5)

## 2018-02-05 LAB — LIPASE, BLOOD: Lipase: 21 U/L (ref 11–51)

## 2018-02-05 MED ORDER — SODIUM CHLORIDE 0.9 % IV BOLUS
1000.0000 mL | Freq: Once | INTRAVENOUS | Status: AC
  Administered 2018-02-05: 1000 mL via INTRAVENOUS

## 2018-02-05 MED ORDER — FERROUS SULFATE 325 (65 FE) MG PO TABS: 325.0000 mg | ORAL_TABLET | Freq: Every day | ORAL | 0 refills | Status: DC

## 2018-02-05 MED ORDER — PANTOPRAZOLE SODIUM 40 MG IV SOLR
40.0000 mg | Freq: Once | INTRAVENOUS | Status: AC
  Administered 2018-02-05: 40 mg via INTRAVENOUS
  Filled 2018-02-05: qty 40

## 2018-02-05 MED ORDER — ONDANSETRON HCL 4 MG PO TABS: 4.0000 mg | ORAL_TABLET | Freq: Four times a day (QID) | ORAL | 0 refills | Status: DC

## 2018-02-05 MED ORDER — ONDANSETRON HCL 4 MG/2ML IJ SOLN
4.0000 mg | Freq: Once | INTRAMUSCULAR | Status: AC
  Administered 2018-02-05: 4 mg via INTRAVENOUS
  Filled 2018-02-05: qty 2

## 2018-02-05 NOTE — ED Triage Notes (Signed)
Pt complaint of severe abdominal pain with n/v/d; "I think I got food poisoning."

## 2018-02-05 NOTE — ED Notes (Addendum)
Date and time results received: 02/05/18 1221  Test: hemoglobin Critical Value: 6.2  Name of Provider Notified: Lacinda Axon MD  Orders Received? Or Actions Taken?: awaiting orders

## 2018-02-05 NOTE — Discharge Instructions (Addendum)
You are anemic which will need follow-up.  Prescription for iron and nausea medication.  Increase fluids.  You must get a primary care doctor.

## 2018-02-09 NOTE — ED Provider Notes (Signed)
[facility] DEPT Provider Note   CSN: [id number] Arrival date & time: 02/05/18  1040     History   Chief Complaint Chief Complaint  Patient presents with  . Abdominal Pain    HPI Wendy Ross is a 42 y.o. female.  Patient presents with generalized abdominal pain with associated cramping, nausea, vomiting since this morning.  No coffee-ground emesis, stool, fever, sweats, chills, dysuria.  Past medical history includes abnormal uterine bleeding.  She is ambulatory without syncope.     Past Medical History:  Diagnosis Date  . Asthma   . Hypercholesteremia   . Vaginal Pap smear, abnormal     Patient Active Problem List   Diagnosis Date Noted  . Abnormal uterine bleeding (AUB) 07/05/2017  . Dysmenorrhea 07/05/2017  . Eczema 07/05/2017    Past Surgical History:  Procedure Laterality Date  . TUBAL LIGATION    . WISDOM TOOTH EXTRACTION       OB History    Gravida  6   Para  4   Term  4   Preterm      AB  2   Living  4     SAB      TAB  2   Ectopic      Multiple      Live Births               Home Medications    Prior to Admission medications   Medication Sig Start Date End Date Taking? Authorizing Provider  clobetasol ointment (TEMOVATE) 0.62 % Apply 1 application topically 2 (two) times daily. Apply to affected area Patient not taking: Reported on 02/05/2018 07/05/17   [name] A, CNM  ferrous sulfate 325 (65 FE) MG tablet Take 1 tablet (325 mg total) by mouth daily. 02/05/18   Nat Christen, MD  metroNIDAZOLE (FLAGYL) 500 MG tablet Take 1 tablet (500 mg total) by mouth 2 (two) times daily. Patient not taking: Reported on 02/05/2018 06/06/17   Luvenia Redden, PA-C  nystatin cream (MYCOSTATIN) Apply to affected area 2 times daily Patient not taking: Reported on 02/05/2018 06/06/17   Luvenia Redden, PA-C  ondansetron (ZOFRAN) 4 MG tablet Take 1 tablet (4 mg total) by mouth every 6 (six) hours. 02/05/18   Nat Christen, MD  sulfamethoxazole-trimethoprim (BACTRIM DS,SEPTRA DS) 800-160 MG tablet Take 1 tablet by mouth 2 (two) times daily. Patient not taking: Reported on 02/05/2018 11/13/17   Seabron Spates, CNM  traMADol (ULTRAM) 50 MG tablet Take 1 tablet (50 mg total) by mouth every 6 (six) hours as needed. Patient not taking: Reported on 02/05/2018 11/13/17   Seabron Spates, CNM    Family History Family History  Problem Relation Age of Onset  . Hypertension Mother   . Hypertension Other     Social History Social History   Tobacco Use  . Smoking status: Current Every Day Smoker    Packs/day: 0.25    Types: Cigarettes  . Smokeless tobacco: Never Used  Substance Use Topics  . Alcohol use: Yes    Comment: social  . Drug use: No     Allergies   Percocet [oxycodone-acetaminophen]   Review of Systems Review of Systems  All other systems reviewed and are negative.    Physical Exam Updated Vital Signs BP 108/72 (BP Location: Left Arm)   Pulse 85   Temp 98.4 F (36.9 C) (Oral)   Resp 18   LMP 01/05/2018   SpO2 100%  Physical Exam  Constitutional: She is oriented to person, place, and time. She appears well-developed and well-nourished.  HENT:  Head: Normocephalic and atraumatic.  Eyes: Conjunctivae are normal.  Neck: Neck supple.  Cardiovascular: Normal rate and regular rhythm.  Pulmonary/Chest: Effort normal and breath sounds normal.  Abdominal: Bowel sounds are normal.  Minimal generalized abdominal tenderness.  Genitourinary:  Genitourinary Comments: Rectal exam: No masses, heme-negative.  Musculoskeletal: Normal range of motion.  Neurological: She is alert and oriented to person, place, and time.  Skin: Skin is warm and dry.  Psychiatric: She has a normal mood and affect. Her behavior is normal.  Nursing note and vitals reviewed.    ED Treatments / Results  Labs (all labs ordered are listed, but only abnormal results are displayed) Labs Reviewed    COMPREHENSIVE METABOLIC PANEL - Abnormal; Notable for the following components:      Result Value   CO2 21 (*)    Calcium 8.5 (*)    Albumin 3.4 (*)    ALT 11 (*)    All other components within normal limits  CBC - Abnormal; Notable for the following components:   RBC 3.35 (*)    Hemoglobin 6.2 (*)    HCT 23.0 (*)    MCV 68.7 (*)    MCH 18.5 (*)    MCHC 27.0 (*)    RDW 21.1 (*)    All other components within normal limits  URINALYSIS, ROUTINE W REFLEX MICROSCOPIC - Abnormal; Notable for the following components:   Hgb urine dipstick SMALL (*)    Bacteria, UA RARE (*)    Squamous Epithelial / LPF 0-5 (*)    All other components within normal limits  LIPASE, BLOOD  I-STAT BETA HCG BLOOD, ED ([facility], [facility], [facility] ONLY)  POC OCCULT BLOOD, ED    EKG None  Radiology No results found.  Procedures Procedures (including critical care time)  Medications Ordered in ED Medications  sodium chloride 0.9 % bolus 1,000 mL (0 mLs Intravenous Stopped 02/05/18 1243)  ondansetron (ZOFRAN) injection 4 mg (4 mg Intravenous Given 02/05/18 1141)  pantoprazole (PROTONIX) injection 40 mg (40 mg Intravenous Given 02/05/18 1142)  sodium chloride 0.9 % bolus 1,000 mL (0 mLs Intravenous Stopped 02/05/18 1243)     Initial Impression / Assessment and Plan / ED Course  I have reviewed the triage vital signs and the nursing notes.  Pertinent labs & imaging results that were available during my care of the patient were reviewed by me and considered in my medical decision making (see chart for details).     Patient presents with generalized abdominal cramping, nausea, vomiting.  No black stool.  Hemoglobin 6.2.  She feels better after IV fluids, IV Zofran, IV Protonix.  Laboratory findings were discussed with the patient in great detail.  She understands the extent of her anemia.  She will follow-up with her primary care doctor.  Discharge medications ferrous sulfate 325 mg and Zofran 4 mg.  Final Clinical  Impressions(s) / ED Diagnoses   Final diagnoses:  Abdominal pain, unspecified abdominal location  Anemia, unspecified type    ED Discharge Orders        Ordered    ferrous sulfate 325 (65 FE) MG tablet  Daily     02/05/18 1447    ondansetron (ZOFRAN) 4 MG tablet  Every 6 hours     02/05/18 1449       Nat Christen, MD 02/09/18 1402

## 2018-08-15 ENCOUNTER — Other Ambulatory Visit: Payer: Self-pay

## 2018-08-15 ENCOUNTER — Encounter (HOSPITAL_COMMUNITY): Payer: Self-pay | Admitting: Emergency Medicine

## 2018-08-15 ENCOUNTER — Emergency Department (HOSPITAL_COMMUNITY)
Admission: EM | Admit: 2018-08-15 | Discharge: 2018-08-15 | Disposition: A | Discharge status: Home / Self Care | Payer: Self-pay | Attending: Emergency Medicine | Admitting: Emergency Medicine

## 2018-08-15 ENCOUNTER — Emergency Department (HOSPITAL_COMMUNITY): Payer: Self-pay

## 2018-08-15 DIAGNOSIS — Y929 Unspecified place or not applicable: Secondary | ICD-10-CM | POA: Insufficient documentation

## 2018-08-15 DIAGNOSIS — F17210 Nicotine dependence, cigarettes, uncomplicated: Secondary | ICD-10-CM | POA: Insufficient documentation

## 2018-08-15 DIAGNOSIS — Y998 Other external cause status: Secondary | ICD-10-CM | POA: Insufficient documentation

## 2018-08-15 DIAGNOSIS — S92426A Nondisplaced fracture of distal phalanx of unspecified great toe, initial encounter for closed fracture: Secondary | ICD-10-CM | POA: Insufficient documentation

## 2018-08-15 DIAGNOSIS — Z79899 Other long term (current) drug therapy: Secondary | ICD-10-CM | POA: Insufficient documentation

## 2018-08-15 DIAGNOSIS — J45909 Unspecified asthma, uncomplicated: Secondary | ICD-10-CM | POA: Insufficient documentation

## 2018-08-15 DIAGNOSIS — Y9301 Activity, walking, marching and hiking: Secondary | ICD-10-CM | POA: Insufficient documentation

## 2018-08-15 DIAGNOSIS — W010XXA Fall on same level from slipping, tripping and stumbling without subsequent striking against object, initial encounter: Secondary | ICD-10-CM | POA: Insufficient documentation

## 2018-08-15 MED ORDER — TRAMADOL HCL 50 MG PO TABS
50.0000 mg | ORAL_TABLET | Freq: Once | ORAL | Status: AC
  Administered 2018-08-15: 50 mg via ORAL
  Filled 2018-08-15: qty 1

## 2018-08-15 MED ORDER — HYDROCODONE-ACETAMINOPHEN 5-325 MG PO TABS: 1.0000 | ORAL_TABLET | Freq: Four times a day (QID) | ORAL | 0 refills | Status: DC | PRN

## 2018-08-15 NOTE — ED Provider Notes (Signed)
[facility] DEPT Provider Note   CSN: [id number] Arrival date & time: 08/15/18  4098     History   Chief Complaint Chief Complaint  Patient presents with  . Toe Pain    HPI Wendy Ross is a 42 y.o. female.  42 year old female with prior medical history as detailed below presents for evaluation of left great toe pain.  Patient reports that she picked her toes underneath her foot accidentally yesterday.  She has had significant pain to the left great toe.  She is concerned that she has broken the toe.  She denies other injury.  She reports that the injury itself was accidental.  She is able to ambulate with difficulty.  She took a tramadol at home for pain.  The history is provided by the patient and medical records.  Toe Pain  This is a new problem. The current episode started yesterday. The problem occurs rarely. The problem has not changed since onset.Pertinent negatives include no chest pain and no abdominal pain. The symptoms are aggravated by walking. Nothing relieves the symptoms.    Past Medical History:  Diagnosis Date  . Asthma   . Hypercholesteremia   . Vaginal Pap smear, abnormal     Patient Active Problem List   Diagnosis Date Noted  . Abnormal uterine bleeding (AUB) 07/05/2017  . Dysmenorrhea 07/05/2017  . Eczema 07/05/2017    Past Surgical History:  Procedure Laterality Date  . TUBAL LIGATION    . WISDOM TOOTH EXTRACTION       OB History    Gravida  6   Para  4   Term  4   Preterm      AB  2   Living  4     SAB      TAB  2   Ectopic      Multiple      Live Births               Home Medications    Prior to Admission medications   Medication Sig Start Date End Date Taking? Authorizing Provider  clobetasol ointment (TEMOVATE) 1.19 % Apply 1 application topically 2 (two) times daily. Apply to affected area Patient not taking: Reported on 02/05/2018 07/05/17   [name] A, CNM  ferrous  sulfate 325 (65 FE) MG tablet Take 1 tablet (325 mg total) by mouth daily. 02/05/18   Nat Christen, MD  HYDROcodone-acetaminophen (NORCO/VICODIN) 5-325 MG tablet Take 1-2 tablets by mouth every 6 (six) hours as needed. 08/15/18   Valarie Merino, MD  metroNIDAZOLE (FLAGYL) 500 MG tablet Take 1 tablet (500 mg total) by mouth 2 (two) times daily. Patient not taking: Reported on 02/05/2018 06/06/17   Luvenia Redden, PA-C  nystatin cream (MYCOSTATIN) Apply to affected area 2 times daily Patient not taking: Reported on 02/05/2018 06/06/17   Luvenia Redden, PA-C  ondansetron (ZOFRAN) 4 MG tablet Take 1 tablet (4 mg total) by mouth every 6 (six) hours. 02/05/18   Nat Christen, MD  sulfamethoxazole-trimethoprim (BACTRIM DS,SEPTRA DS) 800-160 MG tablet Take 1 tablet by mouth 2 (two) times daily. Patient not taking: Reported on 02/05/2018 11/13/17   Seabron Spates, CNM  traMADol (ULTRAM) 50 MG tablet Take 1 tablet (50 mg total) by mouth every 6 (six) hours as needed. Patient not taking: Reported on 02/05/2018 11/13/17   Seabron Spates, CNM    Family History Family History  Problem Relation Age of Onset  . Hypertension Mother   .  Hypertension Other     Social History Social History   Tobacco Use  . Smoking status: Current Every Day Smoker    Packs/day: 0.25    Types: Cigarettes  . Smokeless tobacco: Never Used  Substance Use Topics  . Alcohol use: Yes    Comment: social  . Drug use: No     Allergies   Percocet [oxycodone-acetaminophen]   Review of Systems Review of Systems  Cardiovascular: Negative for chest pain.  Gastrointestinal: Negative for abdominal pain.  All other systems reviewed and are negative.    Physical Exam Updated Vital Signs BP 107/88 (BP Location: Right Arm)   Pulse 82   Temp 98.2 F (36.8 C) (Oral)   Resp 18   Ht 5\' 4"  (1.626 m)   Wt 72.6 kg   LMP 08/15/2018   SpO2 100%   BMI 27.46 kg/m   Physical Exam  Constitutional: She is oriented to person, place, and  time. She appears well-developed and well-nourished. No distress.  HENT:  Head: Normocephalic and atraumatic.  Mouth/Throat: Oropharynx is clear and moist.  Eyes: Pupils are equal, round, and reactive to light. Conjunctivae and EOM are normal.  Neck: Normal range of motion. Neck supple.  Cardiovascular: Normal rate, regular rhythm and normal heart sounds.  Pulmonary/Chest: Effort normal and breath sounds normal. No respiratory distress.  Abdominal: Soft. She exhibits no distension. There is no tenderness.  Musculoskeletal: Normal range of motion. She exhibits no edema or deformity.  Significant tenderness and ecchymosis to the distal aspect of the left great toe.  Neurological: She is alert and oriented to person, place, and time.  Skin: Skin is warm and dry. There is pallor.  Psychiatric: She has a normal mood and affect.  Nursing note and vitals reviewed.    ED Treatments / Results  Labs (all labs ordered are listed, but only abnormal results are displayed) Labs Reviewed - No data to display  EKG None  Radiology Dg Foot Complete Left  Result Date: 08/15/2018 CLINICAL DATA:  Injury.  Pain great toe EXAM: LEFT FOOT - COMPLETE 3+ VIEW COMPARISON:  01/11/2016 FINDINGS: Transverse fracture base of distal first phalanx without significant displacement. Fracture extends into the interphalangeal joint. Healed fracture third proximal phalanx since the prior study. No arthropathy. IMPRESSION: Fracture distal first phalanx extending into the interphalangeal joint. Electronically Signed   By: Franchot Gallo M.D.   On: 08/15/2018 10:07    Procedures Procedures (including critical care time)  Medications Ordered in ED Medications  traMADol (ULTRAM) tablet 50 mg (50 mg Oral Given 08/15/18 0936)     Initial Impression / Assessment and Plan / ED Course  I have reviewed the triage vital signs and the nursing notes.  Pertinent labs & imaging results that were available during my care of the  patient were reviewed by me and considered in my medical decision making (see chart for details).     MDM  Screen complete  Patient is presenting for evaluation of injury to the left great toe.  X-ray suggest fracture of the distal phalanx of the left great toe.  Patient understands the plan for outpatient care and management of her injury.  Strict return precautions given and understood.  Importance of close follow-up is stressed.    Final Clinical Impressions(s) / ED Diagnoses   Final diagnoses:  Closed nondisplaced fracture of distal phalanx of great toe, unspecified laterality, initial encounter    ED Discharge Orders         Ordered  HYDROcodone-acetaminophen (NORCO/VICODIN) 5-325 MG tablet  Every 6 hours PRN     08/15/18 1040           Valarie Merino, MD 08/15/18 1102

## 2018-08-15 NOTE — Discharge Instructions (Addendum)
Please return for any problem. Follow up with Orthopedics as instructed.

## 2018-08-15 NOTE — ED Notes (Signed)
Pt to xray

## 2018-08-15 NOTE — ED Notes (Signed)
Left foot elevated and ice applied. 

## 2018-08-15 NOTE — ED Triage Notes (Signed)
Pt complaint of left great toe pain post tripping yesterday; attempted home remedies and buddy taping toes without relief.

## 2020-06-01 ENCOUNTER — Other Ambulatory Visit: Payer: Self-pay

## 2020-06-01 ENCOUNTER — Emergency Department (HOSPITAL_COMMUNITY)
Admission: EM | Admit: 2020-06-01 | Discharge: 2020-06-01 | Disposition: A | Discharge status: Home / Self Care | Payer: Medicaid Other | Attending: Emergency Medicine | Admitting: Emergency Medicine

## 2020-06-01 ENCOUNTER — Encounter (HOSPITAL_COMMUNITY): Payer: Self-pay | Admitting: Emergency Medicine

## 2020-06-01 DIAGNOSIS — J45909 Unspecified asthma, uncomplicated: Secondary | ICD-10-CM | POA: Insufficient documentation

## 2020-06-01 DIAGNOSIS — N760 Acute vaginitis: Secondary | ICD-10-CM | POA: Insufficient documentation

## 2020-06-01 DIAGNOSIS — Z113 Encounter for screening for infections with a predominantly sexual mode of transmission: Secondary | ICD-10-CM | POA: Insufficient documentation

## 2020-06-01 DIAGNOSIS — F17210 Nicotine dependence, cigarettes, uncomplicated: Secondary | ICD-10-CM | POA: Insufficient documentation

## 2020-06-01 DIAGNOSIS — B9689 Other specified bacterial agents as the cause of diseases classified elsewhere: Secondary | ICD-10-CM | POA: Insufficient documentation

## 2020-06-01 DIAGNOSIS — L989 Disorder of the skin and subcutaneous tissue, unspecified: Secondary | ICD-10-CM | POA: Insufficient documentation

## 2020-06-01 LAB — URINALYSIS, ROUTINE W REFLEX MICROSCOPIC
Bilirubin Urine: NEGATIVE
Glucose, UA: NEGATIVE mg/dL
Hgb urine dipstick: NEGATIVE
Ketones, ur: NEGATIVE mg/dL
Leukocytes,Ua: NEGATIVE
Nitrite: NEGATIVE
Protein, ur: NEGATIVE mg/dL
Specific Gravity, Urine: 1.011 (ref 1.005–1.030)
pH: 6 (ref 5.0–8.0)

## 2020-06-01 LAB — HIV ANTIBODY (ROUTINE TESTING W REFLEX): HIV Screen 4th Generation wRfx: NONREACTIVE

## 2020-06-01 LAB — WET PREP, GENITAL
Sperm: NONE SEEN
Trich, Wet Prep: NONE SEEN
Yeast Wet Prep HPF POC: NONE SEEN

## 2020-06-01 LAB — PREGNANCY, URINE: Preg Test, Ur: NEGATIVE

## 2020-06-01 MED ORDER — CLINDAMYCIN PHOSPHATE 1 % EX LOTN: TOPICAL_LOTION | Freq: Two times a day (BID) | CUTANEOUS | 0 refills | Status: AC

## 2020-06-01 MED ORDER — METRONIDAZOLE 500 MG PO TABS: 500.0000 mg | ORAL_TABLET | Freq: Two times a day (BID) | ORAL | 0 refills | Status: DC

## 2020-06-01 NOTE — ED Triage Notes (Signed)
Pt states that she has vaginal discharge that started off 2-3 weeks ago, that was clear watery now turned to yellowish. Denies odor or dysuria. Reports will rush out like on a cycle and having to wear a pad.  Also noticed a bump on left cheek on Saturday. Squeezed and got pus out. Reports now hard and spreading.

## 2020-06-01 NOTE — Discharge Instructions (Signed)
Your pelvic examination was consistent with bacterial vaginosis.  This is not an STD.  We treat this with an antibiotic called Flagyl which she will take twice daily for the next 7 days.  Do not drink any alcohol while taking Flagyl.  Please take all of your antibiotics until finished!   Take your antibiotics with food.  Common side effects of antibiotics include nausea, vomiting, abdominal discomfort, and diarrhea. You may help offset some of this with probiotics which you can buy or get in yogurt. Do not eat  or take the probiotics until 2 hours after your antibiotic.    Some studies suggest that certain antibiotics can reduce the efficacy of certain oral contraceptive pills (birth control), so please use additional contraceptives (condoms or other barrier method) while you are taking the antibiotics and for an additional 5 to 7 days afterwards if you are a female on these medications.  You can apply the clindamycin lotion to the area of swelling on the face.  You can apply warm compresses for 20 minutes at a time a few times daily.  The area may come to ahead and start draining on its own.  Do not push on the area to try to express pus as you can cause worsening infections by doing this.  Stop using the hydrogen peroxide to clean the area.  Follow-up with primary care OB/GYN for reevaluation of symptoms.  Return to the emergency department if any concerning signs or symptoms develop such as fevers, vomiting, difficulty breathing or swallowing, abdominal pain.  Your STD cultures will result within about 48 hours.  You will receive a phone call if any of the results are positive, no phone call if the test results are negative.

## 2020-06-01 NOTE — ED Notes (Signed)
Lab to collect urine pregnancy test since urine already sent to lab

## 2020-06-01 NOTE — ED Provider Notes (Signed)
[facility] DEPT Provider Note   CSN: [id number] Arrival date & time: 06/01/20  0751     History Chief Complaint  Patient presents with  . Vaginal Discharge  . bump on face    Wendy Ross is a 44 y.o. female with history of asthma, hyperlipidemia, dysmenorrhea, eczema presenting for evaluation of acute onset, persistent and progressively worsening skin lesion involving the left cheek for 3 days as well as vaginal discharge for about 2 to 3 weeks.  She reports that 3 days ago she awoke with an area of swelling and tenderness to the left cheek.  She was able to express some purulent drainage from the area initially and since then has not had any drainage but noted some surrounding swelling.  She has been applying peroxide to the area, otherwise has not been taking anything for it.  Denies eye pain, difficulty swallowing, fevers.  The pain feels sore "like a toothache or headache".  For the last 2 to 3 weeks she has noticed abnormal vaginal discharge which is a departure from her normal vaginal discharge.  She reports that it is thin and watery like it.".  It was initially clear and now a yellowish color.  She denies vaginal itching.  She has noticed intermittent lower abdominal cramping which she states feels similar to menstrual cramps.  The nausea, vomiting, urinary symptoms.  She would like to be checked for STDs today.  She states that she is prone to bacterial vaginosis.  She states that ever since she turned 44 years old her period has gone from 3 days in duration to 7 days in duration and this is currently her new baseline.  She has not been seen by an OB/GYN in a couple of years.    The history is provided by the patient.       Past Medical History:  Diagnosis Date  . Asthma   . Hypercholesteremia   . Vaginal Pap smear, abnormal     Patient Active Problem List   Diagnosis Date Noted  . Abnormal uterine bleeding (AUB) 07/05/2017  .  Dysmenorrhea 07/05/2017  . Eczema 07/05/2017    Past Surgical History:  Procedure Laterality Date  . TUBAL LIGATION    . WISDOM TOOTH EXTRACTION       OB History    Gravida  6   Para  4   Term  4   Preterm      AB  2   Living  4     SAB      TAB  2   Ectopic      Multiple      Live Births              Family History  Problem Relation Age of Onset  . Hypertension Mother   . Hypertension Other     Social History   Tobacco Use  . Smoking status: Current Every Day Smoker    Packs/day: 0.25    Types: Cigarettes  . Smokeless tobacco: Never Used  Substance Use Topics  . Alcohol use: Yes    Comment: social  . Drug use: No    Home Medications Prior to Admission medications   Medication Sig Start Date End Date Taking? Authorizing Provider  clindamycin (CLEOCIN T) 1 % lotion Apply topically 2 (two) times daily for 7 days. 06/01/20 06/08/20  Rodell Perna A, PA-C  clobetasol ointment (TEMOVATE) 2.67 % Apply 1 application topically 2 (two) times daily. Apply to  affected area Patient not taking: Reported on 02/05/2018 07/05/17   [name] A, CNM  ferrous sulfate 325 (65 FE) MG tablet Take 1 tablet (325 mg total) by mouth daily. 02/05/18   Nat Christen, MD  HYDROcodone-acetaminophen (NORCO/VICODIN) 5-325 MG tablet Take 1-2 tablets by mouth every 6 (six) hours as needed. 08/15/18   Valarie Merino, MD  metroNIDAZOLE (FLAGYL) 500 MG tablet Take 1 tablet (500 mg total) by mouth 2 (two) times daily. 06/01/20   Nils Flack, Kyisha Fowle A, PA-C  nystatin cream (MYCOSTATIN) Apply to affected area 2 times daily Patient not taking: Reported on 02/05/2018 06/06/17   Luvenia Redden, PA-C  ondansetron (ZOFRAN) 4 MG tablet Take 1 tablet (4 mg total) by mouth every 6 (six) hours. 02/05/18   Nat Christen, MD  sulfamethoxazole-trimethoprim (BACTRIM DS,SEPTRA DS) 800-160 MG tablet Take 1 tablet by mouth 2 (two) times daily. Patient not taking: Reported on 02/05/2018 11/13/17   Seabron Spates, CNM    traMADol (ULTRAM) 50 MG tablet Take 1 tablet (50 mg total) by mouth every 6 (six) hours as needed. Patient not taking: Reported on 02/05/2018 11/13/17   Seabron Spates, CNM    Allergies    Percocet [oxycodone-acetaminophen]  Review of Systems   Review of Systems  Constitutional: Negative for chills and fever.  HENT: Positive for facial swelling.   Gastrointestinal: Positive for abdominal pain (intermittent cramping). Negative for nausea and vomiting.  Genitourinary: Positive for vaginal discharge. Negative for dysuria, frequency, hematuria, urgency, vaginal bleeding and vaginal pain.  Skin:       +skin lesion   All other systems reviewed and are negative.   Physical Exam Updated Vital Signs BP 126/79 (BP Location: Right Arm)   Pulse 67   Temp 98.1 F (36.7 C) (Oral)   Resp 16   Ht 5\' 4"  (1.626 m)   Wt 72.6 kg   LMP 05/12/2020   SpO2 100%   BMI 27.46 kg/m   Physical Exam Vitals and nursing note reviewed.  Constitutional:      General: She is not in acute distress.    Appearance: She is well-developed.  HENT:     Head: Normocephalic and atraumatic.     Comments: Superficial skin lesion to the left cheek measuring <1cm. Mild underlying induration but no fluctuance, erythema, or drainage. Mild surrounding swelling.     Mouth/Throat:     Mouth: Mucous membranes are moist.     Comments: Tolerating secretions Eyes:     General:        Right eye: No discharge.        Left eye: No discharge.     Extraocular Movements: Extraocular movements intact.     Conjunctiva/sclera: Conjunctivae normal.  Neck:     Vascular: No JVD.     Trachea: No tracheal deviation.  Cardiovascular:     Rate and Rhythm: Normal rate and regular rhythm.  Pulmonary:     Effort: Pulmonary effort is normal.  Abdominal:     General: Abdomen is flat. Bowel sounds are normal. There is no distension.     Palpations: Abdomen is soft.     Tenderness: There is no abdominal tenderness. There is no right  CVA tenderness, left CVA tenderness, guarding or rebound.  Genitourinary:    Comments: Examination performed in the presence of a chaperone.  No masses or lesions to the external genitalia.  No inguinal lymphadenopathy.  Cervix is not friable in appearance.  There is a moderate amount of clear-white vaginal  discharge in the vaginal vault.  No cervical motion tenderness or adnexal tenderness. Musculoskeletal:     Cervical back: Neck supple.  Skin:    General: Skin is warm and dry.     Findings: No erythema.  Neurological:     Mental Status: She is alert.  Psychiatric:        Behavior: Behavior normal.     ED Results / Procedures / Treatments   Labs (all labs ordered are listed, but only abnormal results are displayed) Labs Reviewed  WET PREP, GENITAL - Abnormal; Notable for the following components:      Result Value   Clue Cells Wet Prep HPF POC PRESENT (*)    WBC, Wet Prep HPF POC FEW (*)    All other components within normal limits  URINALYSIS, ROUTINE W REFLEX MICROSCOPIC  PREGNANCY, URINE  RPR  HIV ANTIBODY (ROUTINE TESTING W REFLEX)  POC URINE PREG, ED  GC/CHLAMYDIA PROBE AMP ([place]) NOT AT [facility]    EKG None  Radiology No results found.  Procedures Procedures (including critical care time)  Medications Ordered in ED Medications - No data to display  ED Course  I have reviewed the triage vital signs and the nursing notes.  Pertinent labs & imaging results that were available during my care of the patient were reviewed by me and considered in my medical decision making (see chart for details).    MDM Rules/Calculators/A&P                          Patient presenting for evaluation of 2 complaints.  3 days ago she noticed a lesion consistent with an abscess to the left cheek that she was able to drain purulent fluid from and since then has had some induration and surrounding facial swelling.  She is afebrile and vital signs are stable.  She is nontoxic in  appearance.  She is tolerating secretions without difficulty.  No concern for peritonsillar abscess, deep space neck infection, meningitis, or orbital cellulitis.  She has a well-healing scab to that area.  No active drainage.  There does not appear to be any dental involvement, nor is there any evidence of any abscess that would be amenable to drainage in the ED which I explained to the patient.  Recommended warm compresses and application of clindamycin lotion topically.  We discussed appropriate wound care.  She will discontinue use of the hydrogen peroxide.  She has had vaginal discharge for the last 2 or 3 weeks.  Her abdomen is soft and nontender and I have a low concern for acute surgical abdominal pathology.  Her pregnancy test is negative and I have a low suspicion of ectopic pregnancy.  Pelvic examination is not concerning for PID and doubt TOA or ovarian torsion given she is overall quite well-appearing.  Wet prep suggestive of BV which she states she is prone to.  Her UA is not concerning for UTI or nephrolithiasis.  Will treat with course of Flagyl.  She did mention that her menstrual cycles have changed over the last 4 years and she has not seen an OB/GYN outpatient in some time.  I did recommend outpatient OB/GYN follow-up for reevaluation of frequent recurrent BV and abnormal menses.  She understands that she has STD cultures pending and that she will receive a phone call if any of her results are abnormal.  We will give her referral for this.  Discussed strict ED return precautions.  Patient  verbalized understanding of and agreement with plan and is stable for discharge at this time.   Final Clinical Impression(s) / ED Diagnoses Final diagnoses:  BV (bacterial vaginosis)  Face lesion    Rx / DC Orders ED Discharge Orders         Ordered    metroNIDAZOLE (FLAGYL) 500 MG tablet  2 times daily     Discontinue  Reprint     06/01/20 1209    clindamycin (CLEOCIN T) 1 % lotion  2 times  daily     Discontinue  Reprint     06/01/20 1209           Renita Papa, PA-C 06/01/20 1229    Tegeler, Gwenyth Allegra, MD 06/01/20 [phone]

## 2020-06-02 LAB — RPR: RPR Ser Ql: NONREACTIVE

## 2020-11-07 DIAGNOSIS — U071 COVID-19: Secondary | ICD-10-CM

## 2020-11-07 HISTORY — DX: COVID-19: U07.1

## 2020-11-09 ENCOUNTER — Other Ambulatory Visit: Payer: Medicaid Other

## 2020-11-16 ENCOUNTER — Encounter (HOSPITAL_COMMUNITY): Payer: Self-pay

## 2020-11-16 ENCOUNTER — Emergency Department (HOSPITAL_COMMUNITY)
Admission: EM | Admit: 2020-11-16 | Discharge: 2020-11-16 | Disposition: A | Discharge status: Home / Self Care | Payer: HRSA Program | Attending: Emergency Medicine | Admitting: Emergency Medicine

## 2020-11-16 ENCOUNTER — Other Ambulatory Visit: Payer: Self-pay

## 2020-11-16 DIAGNOSIS — R509 Fever, unspecified: Secondary | ICD-10-CM | POA: Diagnosis present

## 2020-11-16 DIAGNOSIS — J45909 Unspecified asthma, uncomplicated: Secondary | ICD-10-CM | POA: Diagnosis not present

## 2020-11-16 DIAGNOSIS — U071 COVID-19: Secondary | ICD-10-CM | POA: Diagnosis not present

## 2020-11-16 DIAGNOSIS — F17210 Nicotine dependence, cigarettes, uncomplicated: Secondary | ICD-10-CM | POA: Diagnosis not present

## 2020-11-16 LAB — POC SARS CORONAVIRUS 2 AG -  ED: SARS Coronavirus 2 Ag: POSITIVE — AB

## 2020-11-16 MED ORDER — ACETAMINOPHEN 325 MG PO TABS
650.0000 mg | ORAL_TABLET | Freq: Once | ORAL | Status: AC | PRN
  Administered 2020-11-16: 650 mg via ORAL
  Filled 2020-11-16: qty 2

## 2020-11-16 NOTE — Discharge Instructions (Signed)
Plenty of rest, drink a lot of fluids and try to eat 3 meals a day.  See the doctor of your choice for worsening symptoms that are concerning for trouble breathing or weakness.  Wear a mask whenever you are around anyone else and try to isolate yourself away from everyone for at least 5 days, and until all of your symptoms are resolved.

## 2020-11-16 NOTE — ED Triage Notes (Signed)
Patient c/o fever, headache, body aches, and cough x 2 days. Patient states she was exposed to Covid + person the day before.

## 2020-11-16 NOTE — ED Provider Notes (Signed)
[facility] DEPT Provider Note   CSN: [id number] Arrival date & time: 11/16/20  5427     History Chief Complaint  Patient presents with  . Covid Exposure    [name] is a 45 y.o. female.  HPI She complains of fever and chills, since yesterday.  Also has myalgias.  She denies shortness of breath, chest pain, weakness or dizziness.  There are no other known modifying factors.    Past Medical History:  Diagnosis Date  . Asthma   . Hypercholesteremia   . Vaginal Pap smear, abnormal     Patient Active Problem List   Diagnosis Date Noted  . Abnormal uterine bleeding (AUB) 07/05/2017  . Dysmenorrhea 07/05/2017  . Eczema 07/05/2017    Past Surgical History:  Procedure Laterality Date  . TUBAL LIGATION    . WISDOM TOOTH EXTRACTION       OB History    Gravida  6   Para  4   Term  4   Preterm      AB  2   Living  4     SAB      IAB  2   Ectopic      Multiple      Live Births              Family History  Problem Relation Age of Onset  . Hypertension Mother   . Hypertension Other     Social History   Tobacco Use  . Smoking status: Current Every Day Smoker    Packs/day: 0.25    Types: Cigarettes  . Smokeless tobacco: Never Used  Vaping Use  . Vaping Use: Never used  Substance Use Topics  . Alcohol use: Yes    Comment: social  . Drug use: No    Home Medications Prior to Admission medications   Medication Sig Start Date End Date Taking? Authorizing Provider  clobetasol ointment (TEMOVATE) 0.62 % Apply 1 application topically 2 (two) times daily. Apply to affected area Patient not taking: Reported on 02/05/2018 07/05/17   [name] A, CNM  ferrous sulfate 325 (65 FE) MG tablet Take 1 tablet (325 mg total) by mouth daily. 02/05/18   Nat Christen, MD  HYDROcodone-acetaminophen (NORCO/VICODIN) 5-325 MG tablet Take 1-2 tablets by mouth every 6 (six) hours as needed. 08/15/18   Valarie Merino, MD   metroNIDAZOLE (FLAGYL) 500 MG tablet Take 1 tablet (500 mg total) by mouth 2 (two) times daily. 06/01/20   Nils Flack, Mina A, PA-C  nystatin cream (MYCOSTATIN) Apply to affected area 2 times daily Patient not taking: Reported on 02/05/2018 06/06/17   Luvenia Redden, PA-C  ondansetron (ZOFRAN) 4 MG tablet Take 1 tablet (4 mg total) by mouth every 6 (six) hours. 02/05/18   Nat Christen, MD  sulfamethoxazole-trimethoprim (BACTRIM DS,SEPTRA DS) 800-160 MG tablet Take 1 tablet by mouth 2 (two) times daily. Patient not taking: Reported on 02/05/2018 11/13/17   Seabron Spates, CNM  traMADol (ULTRAM) 50 MG tablet Take 1 tablet (50 mg total) by mouth every 6 (six) hours as needed. Patient not taking: Reported on 02/05/2018 11/13/17   Seabron Spates, CNM    Allergies    Percocet [oxycodone-acetaminophen]  Review of Systems   Review of Systems  All other systems reviewed and are negative.   Physical Exam Updated Vital Signs BP 101/70 (BP Location: Right Arm)   Pulse 86   Temp (!) 101.8 F (38.8 C) (Oral)   Resp  14   Ht 5\' 4"  (1.626 m)   Wt 69.6 kg   SpO2 96%   BMI 26.33 kg/m   Physical Exam Vitals and nursing note reviewed.  Constitutional:      General: She is not in acute distress.    Appearance: She is well-developed and well-nourished. She is not ill-appearing or diaphoretic.  HENT:     Head: Normocephalic and atraumatic.     Right Ear: External ear normal.     Left Ear: External ear normal.  Eyes:     Extraocular Movements: EOM normal.     Conjunctiva/sclera: Conjunctivae normal.     Pupils: Pupils are equal, round, and reactive to light.  Neck:     Trachea: Phonation normal.  Cardiovascular:     Rate and Rhythm: Normal rate and regular rhythm.  Pulmonary:     Effort: Pulmonary effort is normal. No respiratory distress.     Breath sounds: Normal breath sounds. No stridor.  Chest:     Chest wall: No bony tenderness.  Abdominal:     General: There is no distension.   Musculoskeletal:        General: Normal range of motion.     Cervical back: Normal range of motion and neck supple.  Skin:    General: Skin is warm, dry and intact.  Neurological:     Mental Status: She is alert and oriented to person, place, and time.     Cranial Nerves: No cranial nerve deficit.     Sensory: No sensory deficit.     Motor: No abnormal muscle tone.     Coordination: Coordination normal.  Psychiatric:        Mood and Affect: Mood and affect and mood normal.        Behavior: Behavior normal.        Thought Content: Thought content normal.        Judgment: Judgment normal.     ED Results / Procedures / Treatments   Labs (all labs ordered are listed, but only abnormal results are displayed) Labs Reviewed  POC SARS CORONAVIRUS 2 AG -  ED - Abnormal; Notable for the following components:      Result Value   SARS Coronavirus 2 Ag POSITIVE (*)    All other components within normal limits    EKG None  Radiology No results found.  Procedures Procedures (including critical care time)  Medications Ordered in ED Medications  acetaminophen (TYLENOL) tablet 650 mg (650 mg Oral Given 11/16/20 1008)    ED Course  I have reviewed the triage vital signs and the nursing notes.  Pertinent labs & imaging results that were available during my care of the patient were reviewed by me and considered in my medical decision making (see chart for details).    MDM Rules/Calculators/A&P                           Patient Vitals for the past 24 hrs:  BP Temp Temp src Pulse Resp SpO2 Height Weight  11/16/20 0955 - - - - - 96 % - -  11/16/20 0932 101/70 (!) 101.8 F (38.8 C) Oral 86 14 100 % 5\' 4"  (1.626 m) 69.6 kg    11:39 AM Reevaluation with update and discussion. After initial assessment and treatment, an updated evaluation reveals no change in clinical status, findings discussed with the patient and all questions were answered. Daleen Bo   Medical Decision  Making:  This patient is presenting for evaluation of acute upper respiratory illness, which does not require a range of treatment options, and is not a complaint that involves a high risk of morbidity and mortality. The differential diagnoses include COVID infection, flu infection, URI. COVID antigen test positive. I decided to review old records, and in summary healthy middle-aged adult, presenting for evaluation of an acute illness, likely COVID.  I do not require additional historical information from anyone.  Screening COVID test ordered.  Patient to check results on care everywhere.    Critical Interventions-presenting with 2-day illness, likely COVID, no red flags.  After These Interventions, the Patient was reevaluated and was found stable for discharge, COVID test is positive, and that she can self treat at home.  ALTO MONGOLD was evaluated in Emergency Department on 11/16/2020 for the symptoms described in the history of present illness. She was evaluated in the context of the global COVID-19 pandemic, which necessitated consideration that the patient might be at risk for infection with the SARS-CoV-2 virus that causes COVID-19. Institutional protocols and algorithms that pertain to the evaluation of patients at risk for COVID-19 are in a state of rapid change based on information released by regulatory bodies including the CDC and federal and state organizations. These policies and algorithms were followed during the patient's care in the ED.  CRITICAL CARE-no Performed by: Daleen Bo  Nursing Notes Reviewed/ Care Coordinated Applicable Imaging Reviewed Interpretation of Laboratory Data incorporated into ED treatment  The patient appears reasonably screened and/or stabilized for discharge and I doubt any other medical condition or other [facility] requiring further screening, evaluation, or treatment in the ED at this time prior to discharge.  Plan: Home Medications-symptomatic's;  Home Treatments-rest, fluids; return here if the recommended treatment, does not improve the symptoms; Recommended follow up-, PCP, as needed     Final Clinical Impression(s) / ED Diagnoses Final diagnoses:  COVID-19 virus infection    Rx / DC Orders ED Discharge Orders    None       Daleen Bo, MD 11/16/20 1143

## 2020-11-16 NOTE — ED Notes (Signed)
Vitals charted at 0955 charted on the wrong person.

## 2020-11-16 NOTE — ED Triage Notes (Signed)
Vitals at 0855 charted on the wrong patient

## 2020-11-25 ENCOUNTER — Inpatient Hospital Stay (HOSPITAL_COMMUNITY)
Admission: EM | Admit: 2020-11-25 | Discharge: 2020-11-28 | DRG: 064 | Disposition: A | Discharge status: Home / Self Care | Payer: Self-pay | Attending: Family Medicine | Admitting: Family Medicine

## 2020-11-25 ENCOUNTER — Emergency Department (HOSPITAL_COMMUNITY): Payer: Self-pay

## 2020-11-25 ENCOUNTER — Observation Stay (HOSPITAL_COMMUNITY): Payer: Self-pay

## 2020-11-25 ENCOUNTER — Encounter (HOSPITAL_COMMUNITY): Payer: Self-pay

## 2020-11-25 DIAGNOSIS — R079 Chest pain, unspecified: Secondary | ICD-10-CM

## 2020-11-25 DIAGNOSIS — Z79899 Other long term (current) drug therapy: Secondary | ICD-10-CM

## 2020-11-25 DIAGNOSIS — E876 Hypokalemia: Secondary | ICD-10-CM | POA: Diagnosis present

## 2020-11-25 DIAGNOSIS — F17210 Nicotine dependence, cigarettes, uncomplicated: Secondary | ICD-10-CM | POA: Diagnosis present

## 2020-11-25 DIAGNOSIS — D500 Iron deficiency anemia secondary to blood loss (chronic): Secondary | ICD-10-CM | POA: Diagnosis present

## 2020-11-25 DIAGNOSIS — R2681 Unsteadiness on feet: Secondary | ICD-10-CM | POA: Diagnosis present

## 2020-11-25 DIAGNOSIS — I10 Essential (primary) hypertension: Secondary | ICD-10-CM | POA: Diagnosis present

## 2020-11-25 DIAGNOSIS — I6340 Cerebral infarction due to embolism of unspecified cerebral artery: Principal | ICD-10-CM | POA: Insufficient documentation

## 2020-11-25 DIAGNOSIS — F1490 Cocaine use, unspecified, uncomplicated: Secondary | ICD-10-CM

## 2020-11-25 DIAGNOSIS — I639 Cerebral infarction, unspecified: Secondary | ICD-10-CM

## 2020-11-25 DIAGNOSIS — Q211 Atrial septal defect: Secondary | ICD-10-CM

## 2020-11-25 DIAGNOSIS — R29700 NIHSS score 0: Secondary | ICD-10-CM | POA: Diagnosis present

## 2020-11-25 DIAGNOSIS — Z8249 Family history of ischemic heart disease and other diseases of the circulatory system: Secondary | ICD-10-CM

## 2020-11-25 DIAGNOSIS — J45909 Unspecified asthma, uncomplicated: Secondary | ICD-10-CM | POA: Diagnosis present

## 2020-11-25 DIAGNOSIS — F17200 Nicotine dependence, unspecified, uncomplicated: Secondary | ICD-10-CM

## 2020-11-25 DIAGNOSIS — G8191 Hemiplegia, unspecified affecting right dominant side: Secondary | ICD-10-CM | POA: Diagnosis present

## 2020-11-25 DIAGNOSIS — E785 Hyperlipidemia, unspecified: Secondary | ICD-10-CM | POA: Diagnosis present

## 2020-11-25 DIAGNOSIS — U071 COVID-19: Secondary | ICD-10-CM | POA: Diagnosis present

## 2020-11-25 DIAGNOSIS — R55 Syncope and collapse: Secondary | ICD-10-CM

## 2020-11-25 DIAGNOSIS — E7800 Pure hypercholesterolemia, unspecified: Secondary | ICD-10-CM | POA: Diagnosis present

## 2020-11-25 DIAGNOSIS — N920 Excessive and frequent menstruation with regular cycle: Secondary | ICD-10-CM | POA: Diagnosis present

## 2020-11-25 DIAGNOSIS — D649 Anemia, unspecified: Secondary | ICD-10-CM

## 2020-11-25 LAB — CBC
HCT: 23 % — ABNORMAL LOW (ref 36.0–46.0)
Hemoglobin: 5.6 g/dL — CL (ref 12.0–15.0)
MCH: 17.2 pg — ABNORMAL LOW (ref 26.0–34.0)
MCHC: 24.3 g/dL — ABNORMAL LOW (ref 30.0–36.0)
MCV: 70.6 fL — ABNORMAL LOW (ref 80.0–100.0)
Platelets: 660 10*3/uL — ABNORMAL HIGH (ref 150–400)
RBC: 3.26 MIL/uL — ABNORMAL LOW (ref 3.87–5.11)
RDW: 23.5 % — ABNORMAL HIGH (ref 11.5–15.5)
WBC: 5.1 10*3/uL (ref 4.0–10.5)
nRBC: 0 % (ref 0.0–0.2)

## 2020-11-25 LAB — DIFFERENTIAL
Abs Immature Granulocytes: 0.01 10*3/uL (ref 0.00–0.07)
Basophils Absolute: 0 10*3/uL (ref 0.0–0.1)
Basophils Relative: 0 %
Eosinophils Absolute: 0.2 10*3/uL (ref 0.0–0.5)
Eosinophils Relative: 3 %
Immature Granulocytes: 0 %
Lymphocytes Relative: 25 %
Lymphs Abs: 1.3 10*3/uL (ref 0.7–4.0)
Monocytes Absolute: 0.3 10*3/uL (ref 0.1–1.0)
Monocytes Relative: 6 %
Neutro Abs: 3.4 10*3/uL (ref 1.7–7.7)
Neutrophils Relative %: 66 %

## 2020-11-25 LAB — IRON AND TIBC
Iron: 19 ug/dL — ABNORMAL LOW (ref 28–170)
Saturation Ratios: 4 % — ABNORMAL LOW (ref 10.4–31.8)
TIBC: 441 ug/dL (ref 250–450)
UIBC: 422 ug/dL

## 2020-11-25 LAB — COMPREHENSIVE METABOLIC PANEL
ALT: 13 U/L (ref 0–44)
AST: 17 U/L (ref 15–41)
Albumin: 3.5 g/dL (ref 3.5–5.0)
Alkaline Phosphatase: 33 U/L — ABNORMAL LOW (ref 38–126)
Anion gap: 8 (ref 5–15)
BUN: <5 mg/dL — ABNORMAL LOW (ref 6–20)
CO2: 23 mmol/L (ref 22–32)
Calcium: 8.7 mg/dL — ABNORMAL LOW (ref 8.9–10.3)
Chloride: 108 mmol/L (ref 98–111)
Creatinine, Ser: 0.69 mg/dL (ref 0.44–1.00)
GFR, Estimated: >60 mL/min (ref >60)
Glucose, Bld: 94 mg/dL (ref 70–99)
Potassium: 3.3 mmol/L — ABNORMAL LOW (ref 3.5–5.1)
Sodium: 139 mmol/L (ref 135–145)
Total Bilirubin: 0.4 mg/dL (ref 0.3–1.2)
Total Protein: 6.5 g/dL (ref 6.5–8.1)

## 2020-11-25 LAB — POC OCCULT BLOOD, ED: Fecal Occult Bld: NEGATIVE

## 2020-11-25 LAB — RETICULOCYTES
Immature Retic Fract: 18.9 % — ABNORMAL HIGH (ref 2.3–15.9)
RBC.: 3.05 MIL/uL — ABNORMAL LOW (ref 3.87–5.11)
Retic Count, Absolute: 35.1 10*3/uL (ref 19.0–186.0)
Retic Ct Pct: 1.2 % (ref 0.4–3.1)

## 2020-11-25 LAB — VITAMIN B12: Vitamin B-12: 300 pg/mL (ref 180–914)

## 2020-11-25 LAB — ABO/RH: ABO/RH(D): O POS

## 2020-11-25 LAB — PROTIME-INR
INR: 1.2 (ref 0.8–1.2)
Prothrombin Time: 14.5 seconds (ref 11.4–15.2)

## 2020-11-25 LAB — I-STAT BETA HCG BLOOD, ED (MC, WL, AP ONLY): I-stat hCG, quantitative: <5 m[IU]/mL (ref <5)

## 2020-11-25 LAB — PREPARE RBC (CROSSMATCH)

## 2020-11-25 LAB — FOLATE: Folate: 12.2 ng/mL (ref >5.9)

## 2020-11-25 LAB — APTT: aPTT: 30 seconds (ref 24–36)

## 2020-11-25 LAB — FERRITIN: Ferritin: 1 ng/mL — ABNORMAL LOW (ref 11–307)

## 2020-11-25 MED ORDER — SODIUM CHLORIDE 0.9 % IV SOLN
10.0000 mL/h | Freq: Once | INTRAVENOUS | Status: AC
  Administered 2020-11-25: 10 mL/h via INTRAVENOUS

## 2020-11-25 MED ORDER — FERROUS SULFATE 325 (65 FE) MG PO TABS
325.0000 mg | ORAL_TABLET | Freq: Every day | ORAL | Status: DC
  Administered 2020-11-25 – 2020-11-28 (×4): 325 mg via ORAL
  Filled 2020-11-25 (×4): qty 1

## 2020-11-25 MED ORDER — DIPHENHYDRAMINE HCL 50 MG/ML IJ SOLN: 25.0000 mg | Freq: Four times a day (QID) | INTRAMUSCULAR | Status: DC | PRN

## 2020-11-25 MED ORDER — POTASSIUM CHLORIDE CRYS ER 20 MEQ PO TBCR
20.0000 meq | EXTENDED_RELEASE_TABLET | Freq: Once | ORAL | Status: AC
  Administered 2020-11-25: 20 meq via ORAL
  Filled 2020-11-25: qty 1

## 2020-11-25 MED ORDER — SODIUM CHLORIDE 0.9% FLUSH
3.0000 mL | Freq: Once | INTRAVENOUS | Status: AC
  Administered 2020-11-25: 3 mL via INTRAVENOUS

## 2020-11-25 MED ORDER — NICOTINE 14 MG/24HR TD PT24
14.0000 mg | MEDICATED_PATCH | Freq: Every day | TRANSDERMAL | Status: DC
  Administered 2020-11-25 – 2020-11-28 (×4): 14 mg via TRANSDERMAL
  Filled 2020-11-25 (×4): qty 1

## 2020-11-25 MED ORDER — ACETAMINOPHEN 325 MG PO TABS: 650.0000 mg | ORAL_TABLET | Freq: Four times a day (QID) | ORAL | Status: DC | PRN

## 2020-11-25 MED ORDER — DIPHENHYDRAMINE HCL 25 MG PO CAPS: 25.0000 mg | ORAL_CAPSULE | Freq: Four times a day (QID) | ORAL | Status: DC | PRN

## 2020-11-25 NOTE — ED Provider Notes (Signed)
[facility] EMERGENCY DEPARTMENT Provider Note   CSN: [id number] Arrival date & time: 11/25/20  1230     History Chief Complaint  Patient presents with  . Loss of Consciousness  . Numbness    Wendy Ross is a 45 y.o. female has a history of asthma who was recently diagnosed with COVID on 11/16/2020 who presents for evaluation of syncopal episode.  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  Patient reports that she felt dizzy/lightheaded and then felt herself pass out.  She reports that the customer heard her and came to her.  Patient states that she felt like she was only unconscious for a few seconds.  She reports that she woke up and remembered what happened.  She felt like her right leg and arm were contracted up against her body and were numb.  She states she felt like they were spasming and shaking.  The customer stated that she did not see any seizure activity.  Patient reports that she still felt weak and numb on her right upper hand lower extremity.  She reports that this is improved in route with EMS.  On ED arrival, she states she is no longer numb.  She states her right lower leg still feels slightly "off."  She reports no preceding chest pain that caused her fall.  She states that she had been in her normal state of health prior to the episodes.  She reports that her last menstrual cycle just stopped about a day or so ago.  She does report she had 2 days of heavy bleeding and then it went back to normal.  She has not noted any rectal bleeding.  She denies any vision changes, chest pain, difficulty breathing, abdominal pain, nausea/vomiting, numbness/weakness of arms or legs.  The history is provided by the patient.       Past Medical History:  Diagnosis Date  . Asthma   . Hypercholesteremia   . Vaginal Pap smear, abnormal     Patient Active Problem List   Diagnosis Date Noted  . Abnormal uterine bleeding (AUB) 07/05/2017   . Dysmenorrhea 07/05/2017  . Eczema 07/05/2017    Past Surgical History:  Procedure Laterality Date  . TUBAL LIGATION    . WISDOM TOOTH EXTRACTION       OB History    Gravida  6   Para  4   Term  4   Preterm      AB  2   Living  4     SAB      IAB  2   Ectopic      Multiple      Live Births              Family History  Problem Relation Age of Onset  . Hypertension Mother   . Hypertension Other     Social History   Tobacco Use  . Smoking status: Current Every Day Smoker    Packs/day: 0.25    Types: Cigarettes  . Smokeless tobacco: Never Used  Vaping Use  . Vaping Use: Never used  Substance Use Topics  . Alcohol use: Yes    Comment: social  . Drug use: No    Home Medications Prior to Admission medications   Medication Sig Start Date End Date Taking? Authorizing Provider  clobetasol ointment (TEMOVATE) [id number] % Apply 1 application topically 2 (two) times daily. Apply to affected area Patient not taking: No  sig reported 07/05/17   Leftwich-Kirby, Kathie Dike, CNM  ferrous sulfate 325 (65 FE) MG tablet Take 1 tablet (325 mg total) by mouth daily. Patient not taking: Reported on 11/25/2020 02/05/18   Nat Christen, MD  HYDROcodone-acetaminophen (NORCO/VICODIN) 5-325 MG tablet Take 1-2 tablets by mouth every 6 (six) hours as needed. Patient not taking: Reported on 11/25/2020 08/15/18   Valarie Merino, MD  nystatin cream (MYCOSTATIN) Apply to affected area 2 times daily Patient not taking: No sig reported 06/06/17   Luvenia Redden, PA-C  ondansetron (ZOFRAN) 4 MG tablet Take 1 tablet (4 mg total) by mouth every 6 (six) hours. Patient not taking: Reported on 11/25/2020 02/05/18   Nat Christen, MD  traMADol (ULTRAM) 50 MG tablet Take 1 tablet (50 mg total) by mouth every 6 (six) hours as needed. Patient not taking: No sig reported 11/13/17   Seabron Spates, CNM    Allergies    Percocet [oxycodone-acetaminophen]  Review of Systems   Review of Systems   Constitutional: Negative for fever.  Eyes: Negative for visual disturbance.  Respiratory: Negative for cough and shortness of breath.   Cardiovascular: Negative for chest pain.  Gastrointestinal: Negative for abdominal pain, blood in stool, nausea and vomiting.  Genitourinary: Negative for dysuria and hematuria.  Neurological: Positive for syncope, weakness and numbness. Negative for headaches.  All other systems reviewed and are negative.   Physical Exam Updated Vital Signs BP (!) 102/36   Pulse (!) 53   Temp 98 F (36.7 C)   Resp 17   Ht 5\' 4"  (1.626 m)   Wt 68 kg   SpO2 100%   BMI 25.75 kg/m   Physical Exam Vitals and nursing note reviewed. Exam conducted with a chaperone present.  Constitutional:      Appearance: Normal appearance. She is well-developed and well-nourished.  HENT:     Head: Normocephalic and atraumatic.     Mouth/Throat:     Mouth: Oropharynx is clear and moist and mucous membranes are normal.  Eyes:     General: Lids are normal.     Extraocular Movements: EOM normal.     Conjunctiva/sclera: Conjunctivae normal.     Pupils: Pupils are equal, round, and reactive to light.  Cardiovascular:     Rate and Rhythm: Normal rate and regular rhythm.     Pulses: Normal pulses.          Radial pulses are 2+ on the right side and 2+ on the left side.       Dorsalis pedis pulses are 2+ on the right side and 2+ on the left side.     Heart sounds: Normal heart sounds. No murmur heard. No friction rub. No gallop.   Pulmonary:     Effort: Pulmonary effort is normal.     Breath sounds: Normal breath sounds.     Comments: Lungs clear to auscultation bilaterally.  Symmetric chest rise.  No wheezing, rales, rhonchi. Abdominal:     Palpations: Abdomen is soft. Abdomen is not rigid.     Tenderness: There is no abdominal tenderness. There is no guarding.     Comments: Abdomen is soft, non-distended, non-tender. No rigidity, No guarding. No peritoneal signs.   Genitourinary:    Comments: The exam was performed with a chaperone present Otila Kluver, NT). Digital Rectal Exam reveals sphincter with good tone. No external hemorrhoids. No masses or fissures. Stool color is brown with no overt blood. Musculoskeletal:        General: Normal  range of motion.     Cervical back: Full passive range of motion without pain.  Skin:    General: Skin is warm and dry.     Capillary Refill: Capillary refill takes less than 2 seconds.  Neurological:     Mental Status: She is alert and oriented to person, place, and time.     Comments: Cranial nerves III-XII intact Follows commands, Moves all extremities  5/5 strength to BUE and BLE  Sensation intact throughout all major nerve distributions No pronator drift. No gait abnormalities  No slurred speech. No facial droop.   Psychiatric:        Mood and Affect: Mood and affect normal.        Speech: Speech normal.     ED Results / Procedures / Treatments   Labs (all labs ordered are listed, but only abnormal results are displayed) Labs Reviewed  CBC - Abnormal; Notable for the following components:      Result Value   RBC 3.26 (*)    Hemoglobin 5.6 (*)    HCT 23.0 (*)    MCV 70.6 (*)    MCH 17.2 (*)    MCHC 24.3 (*)    RDW 23.5 (*)    Platelets 660 (*)    All other components within normal limits  COMPREHENSIVE METABOLIC PANEL - Abnormal; Notable for the following components:   Potassium 3.3 (*)    BUN <5 (*)    Calcium 8.7 (*)    Alkaline Phosphatase 33 (*)    All other components within normal limits  PROTIME-INR  APTT  DIFFERENTIAL  VITAMIN B12  FOLATE  IRON AND TIBC  FERRITIN  RETICULOCYTES  I-STAT BETA HCG BLOOD, ED ([facility], [facility], [facility] ONLY)  POC OCCULT BLOOD, ED  CBG MONITORING, ED  ABO/RH  TYPE AND SCREEN  PREPARE RBC (CROSSMATCH)    EKG EKG Interpretation  Date/Time:  Wednesday November 25 2020 12:44:37 EST Ventricular Rate:  70 PR Interval:  168 QRS Duration: 88 QT Interval:  410 QTC  Calculation: 442 R Axis:   75 Text Interpretation: Normal sinus rhythm Normal ECG Confirmed by Isla Pence [phone]) on 11/25/2020 3:22:40 PM   Radiology CT HEAD WO CONTRAST  Result Date: 11/25/2020 CLINICAL DATA:  Dizziness, loss of consciousness with right arm heaviness and convulsion EXAM: CT HEAD WITHOUT CONTRAST TECHNIQUE: Contiguous axial images were obtained from the base of the skull through the vertex without intravenous contrast. COMPARISON:  None. FINDINGS: Brain: No evidence of acute infarction, hemorrhage, hydrocephalus, extra-axial collection or mass lesion/mass effect. Incidentally noted cavum septum pellucidum and cavum vergae. Vascular: No hyperdense vessel or unexpected calcification. Skull: Normal. Negative for fracture or focal lesion. Sinuses/Orbits: Scattered mucosal thickening of the ethmoid air cells and left maxillary sinus. Mastoid air cells are predominantly clear. Other: None. IMPRESSION: 1. No acute intracranial abnormality. Electronically Signed   By: Dahlia Bailiff MD   On: 11/25/2020 13:09    Procedures .Critical Care Performed by: Volanda Napoleon, PA-C Authorized by: Volanda Napoleon, PA-C   Critical care provider statement:    Critical care time (minutes):  35   Critical care was necessary to treat or prevent imminent or life-threatening deterioration of the following conditions: anemia.   Critical care was time spent personally by me on the following activities:  Discussions with consultants, evaluation of patient's response to treatment, examination of patient, ordering and performing treatments and interventions, ordering and review of laboratory studies, ordering and review of radiographic studies, pulse oximetry,  re-evaluation of patient's condition, obtaining history from patient or surrogate and review of old charts   (including critical care time)  Medications Ordered in ED Medications  0.9 %  sodium chloride infusion (has no administration in  time range)  sodium chloride flush (NS) 0.9 % injection 3 mL (3 mLs Intravenous Given 11/25/20 1506)    ED Course  I have reviewed the triage vital signs and the nursing notes.  Pertinent labs & imaging results that were available during my care of the patient were reviewed by me and considered in my medical decision making (see chart for details).    MDM Rules/Calculators/A&P                          45 year old female who presents for evaluation of syncopal episode, right upper extremity and right lower extremity numbness/weakness.  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  No preceding chest pain.  She felt like her arm contracted but the witness did not see any seizure activity.  Patient reports that afterwards, she felt like her arm and leg were numb.  She states that has since improved.  On initial arrival, she is afebrile nontoxic-appearing.  Vital signs are stable.  On exam, I do not appreciate any neurodeficits.  She has full sensation noted bilateral upper and lower extremities as well as full strength.  She does state that she feels like her right lower extremity still feels off.  I do not appreciate any weakness noted on that side.  Digital rectal exam shows no evidence of melena.  Labs ordered at triage.  Discussed patient with Dr. Leonel Ramsay (Neuro) given that patient still feels like she has heaviness in her leg but I do not appreciate any neurodeficits.  He states that patient does not meet criteria for calling a code stroke but does recommend obtaining an MRI brain.  CBC shows hemoglobin of 5.6.  CMP shows potassium 3.3.  Normal BUN and creatinine.  I-STAT beta negative.  CT head negative for any acute abnormalities.  She had a hemoglobin of 6.2 about 2 years ago.  I do not see any recent.  Looks like her baseline is anywhere between 10-11.  This most likely caused her syncopal episode.  I suspect it is from her heavy bleeding from that she  recently had from her menstrual cycle.  We will plan to transfuse.  Anemia panel ordered.  We will plan admission.  Discussed patient with family medicine who will admit the patient.   Portions of this note were generated with [employer]obby[employer]. Dictation errors may occur despite best attempts at proofreading.   Final Clinical Impression(s) / ED Diagnoses Final diagnoses:  Symptomatic anemia  Syncope, unspecified syncope type    Rx / DC Orders ED Discharge Orders    None       Desma Mcgregor 11/25/20 1524    Isla Pence, MD 11/26/20 [phone]

## 2020-11-25 NOTE — ED Notes (Signed)
Pt received from the lobby.

## 2020-11-25 NOTE — Consult Note (Addendum)
Neurology Consult H&P  CC: acute strokes  History is obtained from: Patient and chart  HPI: Wendy Ross is a 45 y.o. female PMHx as reviewed below was at work earlier in the day 11/25/2020 and felt lightheaded and unsteady with right arm and leg weakness and fell to the ground. She may have lost consciousness but duration is not clear. While she was down, there was no loss of awareness and noticed she had right sided numbness. During the event she felt as though her right arm and leg were shaking.  She has never had this before.  Denies prodrome, visual/hearing changes, chest pain, SOB, nausea/vomiting, diaphoresis.   LKW: unclear tpa given?: No, too mild IR Thrombectomy? No Modified Rankin Scale: 0-Completely asymptomatic and back to baseline post- stroke NIHSS: 0  ROS: A complete ROS was performed and is negative except as noted in the HPI.   Past Medical History:  Diagnosis Date  . Asthma   . Hypercholesteremia   . Vaginal Pap smear, abnormal      Family History  Problem Relation Age of Onset  . Hypertension Mother   . Hypertension Other     Social History:  reports that she has been smoking cigarettes. She has been smoking about 0.25 packs per day. She has never used smokeless tobacco. She reports current alcohol use. She reports that she does not use drugs.   Prior to Admission medications   Medication Sig Start Date End Date Taking? Authorizing Provider  clobetasol ointment (TEMOVATE) 6.96 % Apply 1 application topically 2 (two) times daily. Apply to affected area Patient not taking: No sig reported 07/05/17   Leftwich-Kirby, Lattie Haw A, CNM  ferrous sulfate 325 (65 FE) MG tablet Take 1 tablet (325 mg total) by mouth daily. Patient not taking: Reported on 11/25/2020 02/05/18   Nat Christen, MD  HYDROcodone-acetaminophen (NORCO/VICODIN) 5-325 MG tablet Take 1-2 tablets by mouth every 6 (six) hours as needed. Patient not taking: Reported on 11/25/2020 08/15/18   Valarie Merino, MD  nystatin cream (MYCOSTATIN) Apply to affected area 2 times daily Patient not taking: No sig reported 06/06/17   Luvenia Redden, PA-C  ondansetron (ZOFRAN) 4 MG tablet Take 1 tablet (4 mg total) by mouth every 6 (six) hours. Patient not taking: Reported on 11/25/2020 02/05/18   Nat Christen, MD  traMADol (ULTRAM) 50 MG tablet Take 1 tablet (50 mg total) by mouth every 6 (six) hours as needed. Patient not taking: No sig reported 11/13/17   Seabron Spates, CNM    Exam: Current vital signs: BP 108/61   Pulse (!) 58   Temp 98.2 F (36.8 C) (Oral)   Resp 18   Ht 5\' 4"  (1.626 m)   Wt 68 kg   SpO2 100%   BMI 25.75 kg/m   Physical Exam  Constitutional: Appears well-developed and well-nourished.  Psych: Affect appropriate to situation Eyes: No scleral injection HENT: No OP obstruction. Head: Normocephalic.  Cardiovascular: Normal rate and regular rhythm.  Respiratory: Effort normal, symmetric excursions bilaterally, no audible wheezing. GI: Soft.  No distension. There is no tenderness.  Skin: WDI  Neuro: Mental Status: Patient is awake, alert, oriented to person, place, month, year, and situation. Patient is able to give a clear and coherent history. No signs of aphasia or neglect. Cranial Nerves: II: Visual Fields are full. Pupils are equal, round, and reactive to light. III,IV, VI: EOMI without ptosis or diploplia.  V: Facial sensation is symmetric to temperature VII: Facial movement  is symmetric.  VIII: hearing is intact to voice X: Uvula elevates symmetrically XI: Shoulder shrug is symmetric. XII: tongue is midline without atrophy or fasciculations.  Motor: Tone is normal. Bulk is normal. 5/5 strength was present in all four extremities. Sensory: Sensation is symmetric to light touch and temperature in the arms and legs. Deep Tendon Reflexes: 2+ and symmetric in the biceps and patellae. Plantars: Toes are downgoing bilaterally. Cerebellar: FNF and HKS are intact  bilaterally.  I have reviewed labs in epic and the pertinent results are: Results for LATONJA, BOBECK (MRN [id number]) as of 11/25/2020 22:19  Ref. Range 11/25/2020 12:41  Potassium Latest Ref Range: 3.5 - 5.1 mmol/L 3.3 (L)    Ref. Range 11/26/2020 00:26  RBC Latest Ref Range: 3.87 - 5.11 MIL/uL 3.28 (L)  Hemoglobin Latest Ref Range: 12.0 - 15.0 g/dL 6.3 (LL)  HCT Latest Ref Range: 36.0 - 46.0 % 23.4 (L)  MCV Latest Ref Range: 80.0 - 100.0 fL 71.3 (L)  MCH Latest Ref Range: 26.0 - 34.0 pg 19.2 (L)  MCHC Latest Ref Range: 30.0 - 36.0 g/dL 26.9 (L)  RDW Latest Ref Range: 11.5 - 15.5 % 23.0 (H)  Platelets Latest Ref Range: 150 - 400 K/uL 576 (H)    I have reviewed the images obtained: MRI brain showed numerous small acute cortical/subcortical infarcts within the left frontal lobe, left parietal lobe and left temporoparietal junction (left MCA vascular territory as well as left MCA/ACA and MCA/PCA watershed territories). However the FLAIR was degraded with significant herringbone artifact.  Assessment: SABRYN Ross is a 45 y.o. female PMHx HLD fell and found to have hemoglobin 6.3/HCT 23.4 and new onset watershed strokes.   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 Symptomatic low hemoglobin/hematocrit. Convulsive syncope. HLD  Plan: - Recommend vascular imaging with MRA head and neck or bilateral carotid ultrasound to evaluate possibility of left carotid pathology. - Recommend TTE. - Recommend labs: HbA1c, lipid panel. - Recommend Statin if LDL > 70 - Aspirin 81mg  daily. - Clopidogrel 75mg  daily for 3 weeks. - Permissive hypertension first 24 h < 220/110.  - Telemetry monitoring for arrhythmia. - Recommend bedside Swallow screen. - Recommend Stroke education. - Recommend PT/OT/SLP consult.  Electronically signed by:  Lynnae Sandhoff, MD Page: [phone] 11/25/2020, 10:17 PM

## 2020-11-25 NOTE — ED Notes (Signed)
Patient transported to MRI 

## 2020-11-25 NOTE — ED Triage Notes (Addendum)
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 Pt then started to feel weak and numb on her right side.weakness improved en route to ed with ems, by the time pt arrived to ED no weakness was noted upon exam, no other neuro deficits noted in triage, Pt a.o Pt tested positive for covid on 1/10

## 2020-11-25 NOTE — H&P (Addendum)
[facility] Admission History and Physical Service Pager: [phone]  Patient name: Wendy Ross Medical record number: [id number] Date of birth: 12/13/1975 Age: 45 y.o. Gender: female  Primary Care Provider: Patient, No Pcp Per Consultants: None Code Status: FULL Preferred Emergency Contact: Daughters, Ubaldo Glassing [phone]) & Delana Meyer [phone])  Chief Complaint: Syncope  Assessment and Plan: Wendy Ross is a 45 y.o. female presenting with syncope. PMHx is significant for anemia, menorrhagia, and HLD.  Syncope Patient presents with syncopal episode while at work today. The episode was preceded by dizziness and associated with R arm and leg tingling. It was witnessed by a customer who did not see any seizure activity. In the ED patient's vitals are wnl and her physical exam is unremarkable. Workup significant for hemoglobin of 5.6, platelets 660, and potassium 3.3.  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 Does not appear to be seizure (no post-ictal state, no tongue bite or incontinence) and there are no focal deficits on exam to suggest TIA or stroke, however will obtain MRI to rule out. No active bleeding. FOBT negative.  - Admit to med-surg, obs, attending Dr. Gwendlyn Deutscher - OOB with assistance only - Vitals per routine - Orthostatic vitals - Management of anemia as below  Acute CVA Patient's brain MRI subsequently resulted and showed numerous small acute infarcts in the left MCA as well as left MCA/ACA and MCA/PCA watershed territories.  Patient does not have any focal deficits on exam.  She does complain of left frontal headache.  Underlying etiology could be hypoperfusion from her profound anemia.  Risk factors for ischemic stroke including current smoker, ?hx of hyperlipidemia, and recent COVID. - Will consult neuro, appreciate recommendations - Tylenol 650 mg q6h prn for headache - hgb A1c - lipid panel - TSH  Symptomatic Anemia Patient's hemoglobin found to be 5.6 on admission. She does not have SOB or tachycardia. No lightheadedness currently. She has a history of anemia with last documented hemoglobin 6.2 (on 02/05/2018) per our records.  Prior to that had hemoglobin ~10 in 2014 and 2016.  States she has never received a blood transfusion.  Does not take iron supplementation.  Etiology of her anemia is most likely secondary to menorrhagia- patient's LMP 11/19/2020 and patient states her periods are very heavy.  No bloody bowel movements or other bleeding.  FOBT negative in the ED. - Blood transfusion ordered. Can only receive 1 unit at this time due to shortage - Follow-up posttransfusion H&H - Ferrous sulfate 325 mg daily - May need IV iron - A.m. CBC - Follow-up iron panel - Recommend outpatient OB/GYN work-up for menorrhagia  Hypokalemia Potassium 3.3 on admission. - Will replete with 20 mEq p.o. potassium - A.m. BMP - Will likely also improve with blood transfusion  Recent COVID Patient tested positive on 11/16/2020.  Did not have any symptoms other than one low grade fever and fatigue, which could have been related to her anemia.  She has received 1 dose of COVID vaccine.  Has completed her quarantine per current CDC guidelines. - Recommend completion of COVID vaccination series on discharge  Tobacco Use  Patient smokes 1 PPD. - Nicotine patch - Smoking cessation counseling  FEN/GI: Regular diet Prophylaxis: SCDs  Disposition: Med-Surg, obs  History of Present Illness:  Wendy  Wendy Ross is a 45 y.o. female presenting with syncope.  Patient started to feel whoozy/dizzy while cleaning a client's house earlier today. She grabbed for a table and couldn't catch herself so  fell down. She was on the floor and unable to move but was aware of what was going on around her. Felt like her right side of her body was numb while laying on the floor. The left side could move normally. Did not have any aura, chest pain, diaphoresis.   She is currently only complaining of a frontal headache.  Says she tested positive on January 10th for COVID. Her symptoms had begun the day before and included fever to 100.8 and generalized weakness. She quarantined for 7 days. Denies cough or congestion or any GI symptoms.  Patient's LMP was January 13-19th. Her periods are heavy and regular. This one was heavier than typical. States her doctors in the past have been aware of her heavy periods, but felt it was normal.   Review Of Systems: Per HPI with the following additions:   Review of Systems  Constitutional: Negative for chills, fatigue and fever.  HENT: Negative for congestion and rhinorrhea.   Respiratory: Negative for shortness of breath.   Cardiovascular: Negative for chest pain and leg swelling.  Gastrointestinal: Negative for abdominal pain, diarrhea, nausea and vomiting.  Skin: Negative for rash.  Neurological: Positive for syncope and headaches. Negative for seizures and speech difficulty.  Psychiatric/Behavioral: Negative for confusion.     Patient Active Problem List   Diagnosis Date Noted  . Symptomatic anemia 11/25/2020  . Abnormal uterine bleeding (AUB) 07/05/2017  . Dysmenorrhea 07/05/2017  . Eczema 07/05/2017    Past Medical History: Past Medical History:  Diagnosis Date  . Asthma   . Hypercholesteremia   . Vaginal Pap smear, abnormal     Past Surgical History: Past Surgical History:  Procedure Laterality Date  . TUBAL LIGATION    . WISDOM TOOTH EXTRACTION      Social History: Social History   Tobacco Use  . Smoking status: Current Every Day Smoker    Packs/day: 1.00    Types: Cigarettes  . Smokeless tobacco: Never Used  Vaping Use  .  Vaping Use: Never used  Substance Use Topics  . Alcohol use: Yes    Comment: social  . Drug use: No, remote hx of occasional marijuana use    Family History: Family History  Problem Relation Age of Onset  . Hypertension Mother   . Hypertension Other     Allergies and Medications: Allergies  Allergen Reactions  . Percocet [Oxycodone-Acetaminophen] Nausea Only   No current facility-administered medications on file prior to encounter.   Current Outpatient Medications on File Prior to Encounter  Medication Sig Dispense Refill  . clobetasol ointment (TEMOVATE) 0.34 % Apply 1 application topically 2 (two) times daily. Apply to affected area (Patient not taking: No sig reported) 30 g 2  . ferrous sulfate 325 (65 FE) MG tablet Take 1 tablet (325 mg total) by mouth daily. (Patient not taking: Reported on 11/25/2020) 30 tablet 0  . HYDROcodone-acetaminophen (NORCO/VICODIN) 5-325 MG tablet Take 1-2 tablets by mouth every 6 (six) hours as needed. (Patient not taking: Reported on 11/25/2020) 10 tablet 0  . nystatin cream (MYCOSTATIN) Apply to affected area 2 times daily (Patient not taking: No sig reported) 15 g 0  . ondansetron (ZOFRAN) 4 MG tablet Take 1 tablet (4 mg total) by mouth every 6 (six) hours. (Patient not taking: Reported on 11/25/2020)  8 tablet 0  . traMADol (ULTRAM) 50 MG tablet Take 1 tablet (50 mg total) by mouth every 6 (six) hours as needed. (Patient not taking: No sig reported) 15 tablet 0    Objective: BP (!) 102/36   Pulse (!) 53   Temp 98 F (36.7 C)   Resp 17   Ht 5\' 4"  (1.626 m)   Wt 68 kg   SpO2 100%   BMI 25.75 kg/m  Exam: General: Alert, well-appearing, NAD Eyes: PERRL, EOMI ENTM: Moist mucous membranes, oropharynx clear Cardiovascular: RRR, normal 99991111, II/VI systolic ejection murmur Respiratory: Normal WOB on room air, lungs CTAB Gastrointestinal: +BS,, nontender, nondistended MSK: No peripheral edema, no calf tenderness  Derm: No rashes noted Neuro:  Alert, oriented x3, CN II-VII intact, 5/5 strength in all extremities, sensation intact in bilateral upper and lower extremities, normal finger-to-nose and heel-to-shin, normal gait, normal speech Psych: Appropriate affect  Labs and Imaging: CBC BMET  Recent Labs  Lab 11/25/20 1241  WBC 5.1  HGB 5.6*  HCT 23.0*  PLT 660*   Recent Labs  Lab 11/25/20 1241  NA 139  K 3.3*  CL 108  CO2 23  BUN <5*  CREATININE 0.69  GLUCOSE 94  CALCIUM 8.7*     EKG: NSR at 72 bpm, otherwise normal EKG  CT HEAD WO CONTRAST Result Date: 11/25/2020 IMPRESSION: 1. No acute intracranial abnormality. Electronically Signed   By: Dahlia Bailiff MD   On: 11/25/2020 13:09   MR BRAIN WO CONTRAST Result Date: 11/25/2020 IMPRESSION: Examination limited by artifact as described. Numerous small acute cortical/subcortical infarcts within the left frontal lobe, left parietal lobe and left temporoparietal junction (left MCA vascular territory as well as left MCA/ACA and MCA/PCA watershed territories). Mild-to-moderate mucosal thickening within the bilateral ethmoid and maxillary sinuses. Electronically Signed   By: Kellie Simmering DO   On: 11/25/2020 17:04    Alcus Dad, MD 11/25/2020, 5:51 PM PGY-1, [place] Intern pager: [phone], text pages welcome  [place]    I have seen and examined this patient.     I have discussed the findings and exam with the intern and agree with the above note, which I have edited appropriately. I helped develop the management plan that is described in the resident's note, and I agree with the content.   Doristine Mango, DO PGY-3 Family Medicine Resident

## 2020-11-26 ENCOUNTER — Observation Stay (HOSPITAL_COMMUNITY): Payer: Self-pay

## 2020-11-26 ENCOUNTER — Other Ambulatory Visit: Payer: Self-pay

## 2020-11-26 ENCOUNTER — Encounter (HOSPITAL_COMMUNITY): Payer: Self-pay | Admitting: Student in an Organized Health Care Education/Training Program

## 2020-11-26 DIAGNOSIS — I6310 Cerebral infarction due to embolism of unspecified precerebral artery: Secondary | ICD-10-CM

## 2020-11-26 DIAGNOSIS — I6340 Cerebral infarction due to embolism of unspecified cerebral artery: Secondary | ICD-10-CM | POA: Insufficient documentation

## 2020-11-26 DIAGNOSIS — I6389 Other cerebral infarction: Secondary | ICD-10-CM

## 2020-11-26 LAB — TSH: TSH: 0.968 u[IU]/mL (ref 0.350–4.500)

## 2020-11-26 LAB — RAPID URINE DRUG SCREEN, HOSP PERFORMED
Amphetamines: NOT DETECTED
Barbiturates: NOT DETECTED
Benzodiazepines: NOT DETECTED
Cocaine: POSITIVE — AB
Opiates: NOT DETECTED
Tetrahydrocannabinol: NOT DETECTED

## 2020-11-26 LAB — CBC
HCT: 23.4 % — ABNORMAL LOW (ref 36.0–46.0)
Hemoglobin: 6.3 g/dL — CL (ref 12.0–15.0)
MCH: 19.2 pg — ABNORMAL LOW (ref 26.0–34.0)
MCHC: 26.9 g/dL — ABNORMAL LOW (ref 30.0–36.0)
MCV: 71.3 fL — ABNORMAL LOW (ref 80.0–100.0)
Platelets: 576 10*3/uL — ABNORMAL HIGH (ref 150–400)
RBC: 3.28 MIL/uL — ABNORMAL LOW (ref 3.87–5.11)
RDW: 23 % — ABNORMAL HIGH (ref 11.5–15.5)
WBC: 5.4 10*3/uL (ref 4.0–10.5)
nRBC: 0 % (ref 0.0–0.2)

## 2020-11-26 LAB — PREPARE RBC (CROSSMATCH)

## 2020-11-26 LAB — HEMOGLOBIN AND HEMATOCRIT, BLOOD
HCT: 28.2 % — ABNORMAL LOW (ref 36.0–46.0)
Hemoglobin: 7.6 g/dL — ABNORMAL LOW (ref 12.0–15.0)

## 2020-11-26 LAB — ECHOCARDIOGRAM COMPLETE BUBBLE STUDY
Area-P 1/2: 4.15 cm2
Calc EF: 63.1 %
Height: 64 in
S' Lateral: 2.8 cm
Single Plane A2C EF: 64.5 %
Single Plane A4C EF: 58 %
Weight: 2400 oz

## 2020-11-26 LAB — LIPID PANEL
Cholesterol: 139 mg/dL (ref 0–200)
HDL: 33 mg/dL — ABNORMAL LOW (ref >40)
LDL Cholesterol: 95 mg/dL (ref 0–99)
Total CHOL/HDL Ratio: 4.2 RATIO
Triglycerides: 54 mg/dL (ref <150)
VLDL: 11 mg/dL (ref 0–40)

## 2020-11-26 LAB — HEMOGLOBIN A1C
Hgb A1c MFr Bld: 5.3 % (ref 4.8–5.6)
Mean Plasma Glucose: 105.41 mg/dL

## 2020-11-26 MED ORDER — ASPIRIN EC 81 MG PO TBEC
81.0000 mg | DELAYED_RELEASE_TABLET | Freq: Every day | ORAL | Status: DC
  Administered 2020-11-26 – 2020-11-28 (×3): 81 mg via ORAL
  Filled 2020-11-26 (×3): qty 1

## 2020-11-26 MED ORDER — ATORVASTATIN CALCIUM 40 MG PO TABS
40.0000 mg | ORAL_TABLET | Freq: Every day | ORAL | Status: DC
  Administered 2020-11-26 – 2020-11-28 (×3): 40 mg via ORAL
  Filled 2020-11-26 (×3): qty 1

## 2020-11-26 MED ORDER — CLOPIDOGREL BISULFATE 75 MG PO TABS
75.0000 mg | ORAL_TABLET | Freq: Every day | ORAL | Status: DC
  Administered 2020-11-26 – 2020-11-27 (×2): 75 mg via ORAL
  Filled 2020-11-26 (×2): qty 1

## 2020-11-26 MED ORDER — CLOPIDOGREL BISULFATE 75 MG PO TABS: 75.0000 mg | ORAL_TABLET | Freq: Every day | ORAL | Status: DC

## 2020-11-26 MED ORDER — ASPIRIN EC 81 MG PO TBEC: 81.0000 mg | DELAYED_RELEASE_TABLET | Freq: Every day | ORAL | Status: DC

## 2020-11-26 MED ORDER — NITROGLYCERIN 0.4 MG SL SUBL
SUBLINGUAL_TABLET | SUBLINGUAL | Status: AC
  Administered 2020-11-26: 0.4 mg via SUBLINGUAL
  Filled 2020-11-26: qty 1

## 2020-11-26 MED ORDER — NITROGLYCERIN 0.4 MG SL SUBL
0.4000 mg | SUBLINGUAL_TABLET | SUBLINGUAL | Status: DC | PRN
Start: 2020-11-26 — End: 2020-11-29
  Filled 2020-11-26: qty 1

## 2020-11-26 MED ORDER — SODIUM CHLORIDE 0.9% IV SOLUTION: Freq: Once | INTRAVENOUS | Status: AC

## 2020-11-26 MED ORDER — ONDANSETRON HCL 4 MG/2ML IJ SOLN: 4.0000 mg | Freq: Four times a day (QID) | INTRAMUSCULAR | Status: DC | PRN

## 2020-11-26 NOTE — ED Notes (Signed)
Per blood bank, repeat CBC then will decide if pt able to get 2nd unit of blood.

## 2020-11-26 NOTE — Progress Notes (Signed)
Pt arrived from ED via stretcher. 2 RN skin assessment completed with Blanch Media, Therapist, sports. Pt is Covid positive and on room air at this time. She was able to ambulate to bed. Blood is transfusing at this time. Sinus brady on the monitor. She reports no pain. Will continue to monitor.

## 2020-11-26 NOTE — Significant Event (Signed)
Rapid Response Event Note   Reason for Call :  Rounding on unit when pt began having chest pain.   Initial Focused Assessment:  Pt laying in bed, alert and oriented. Pt is c/o 10/10 shooting chest pain. Pain is mid sternal and, per pt, is radiating to R side of head and jaw. Pt denies SOB. Lungs diminished t/o. Heart sounds WNL. Skin warm and dry.   T-98.4, HR-69, BP-112/76, RR-18, SpO2-100% on RA  Interventions:  EKG-NSR NTG 0.4mg  SL x1-chest pain resolved 42min after admin. PCXR Trop/H/H Plan of Care:  Chest pain resolved after 1 SL NTG. VSS. Await PCXR/trop/H/H results and notify MD if abnormal.   Event Summary:   MD Notified: Dr. Ouida Sills notified by bedside RN and came to bedside to see pt Call Wilkes, [place] Shannahan Anderson, RN

## 2020-11-26 NOTE — Evaluation (Signed)
Physical Therapy Evaluation & Discharge Patient Details Name: Wendy Ross MRN: [id number] DOB: 1976/05/10 Today's Date: 11/26/2020   History of Present Illness  Patient is a 45 y/o female with PMH significant for anemia, asthma, and hypercholesteremia. Patient recently diagnosed with COVID on 11/16/20 who presented to ED following syncopal episode. Patient was found to have symptomatic anemia and small acute infarcts L MCA.  Clinical Impression  PTA, patient lives with son and independent. Patient overall modI for mobility with no AD. No further skilled PT needs required during acute stay. No PT follow up recommended at this time.     Follow Up Recommendations No PT follow up    Equipment Recommendations  None recommended by PT    Recommendations for Other Services       Precautions / Restrictions Precautions Precautions: Fall Restrictions Weight Bearing Restrictions: No      Mobility  Bed Mobility Overal bed mobility: Modified Independent                  Transfers Overall transfer level: Modified independent Equipment used: None                Ambulation/Gait Ambulation/Gait assistance: Modified independent (Device/Increase time) Gait Distance (Feet): 30 Feet Assistive device: None Gait Pattern/deviations: WFL(Within Functional Limits)        Stairs            Wheelchair Mobility    Modified Rankin (Stroke Patients Only)       Balance Overall balance assessment: No apparent balance deficits (not formally assessed)                                           Pertinent Vitals/Pain Pain Assessment: No/denies pain    Home Living Family/patient expects to be discharged to:: Private residence Living Arrangements: Children (son (12 y/o)) Available Help at Discharge: Family Type of Home: House Home Access: Level entry     Home Layout: One level Home Equipment: None      Prior Function Level of Independence:  Independent         Comments: working, driving, independent with ADLs and mobility     Hand Dominance   Dominant Hand: Right    Extremity/Trunk Assessment   Upper Extremity Assessment Upper Extremity Assessment: Defer to OT evaluation    Lower Extremity Assessment Lower Extremity Assessment: Overall WFL for tasks assessed       Communication   Communication: No difficulties  Cognition Arousal/Alertness: Awake/alert Behavior During Therapy: WFL for tasks assessed/performed Overall Cognitive Status: Within Functional Limits for tasks assessed                                        General Comments      Exercises     Assessment/Plan    PT Assessment Patent does not need any further PT services  PT Problem List         PT Treatment Interventions      PT Goals (Current goals can be found in the Care Plan section)  Acute Rehab PT Goals Patient Stated Goal: to go home PT Goal Formulation: With patient Potential to Achieve Goals: Good    Frequency     Barriers to discharge        Co-evaluation  AM-PAC PT "6 Clicks" Mobility  Outcome Measure Help needed turning from your back to your side while in a flat bed without using bedrails?: None Help needed moving from lying on your back to sitting on the side of a flat bed without using bedrails?: None Help needed moving to and from a bed to a chair (including a wheelchair)?: None Help needed standing up from a chair using your arms (e.g., wheelchair or bedside chair)?: None Help needed to walk in hospital room?: None Help needed climbing 3-5 steps with a railing? : None 6 Click Score: 24    End of Session   Activity Tolerance: Patient tolerated treatment well Patient left: in bed;with call bell/phone within reach Nurse Communication: Mobility status PT Visit Diagnosis: Muscle weakness (generalized) (M62.81)    Time: 5498-2641 PT Time Calculation (min) (ACUTE ONLY): 22  min   Charges:   PT Evaluation $PT Eval Low Complexity: 1 Low          Nishaan Stanke A. Gilford Rile PT, DPT Acute Rehabilitation Services Pager [phone] Office [phone]   Wendy Ross 11/26/2020, 3:49 PM

## 2020-11-26 NOTE — Plan of Care (Signed)

## 2020-11-26 NOTE — CV Procedure (Signed)
2D echo attempted, pt in bathroom. Will try later

## 2020-11-26 NOTE — Progress Notes (Signed)
STAT EKG is complete, which showed normal sinus rhythm. Pt reports no more chest pain after one dose of nitro sublingual. VSS. Spoke with Dr. Manus Rudd to give an update about pt.

## 2020-11-26 NOTE — Progress Notes (Addendum)
Interim progress note  Received page from [place] that patient had sudden onset sharp, shooting left sided chest pain that radiated to her jaw and head. Rapid response was called, stat EKG performed showed normal sinus rhythm.  Patient received 1 dose of sublingual nitro, about 5-6 minutes after receiving nitro patient's chest pain had resolved.  Patient's vitals remained stable throughout the episode.  I visited the patient in her room.  She reports that she had gotten up from her bed to use the bathroom, then upon returning to her bed and lying down flat she felt a sensation of fullness and pressure in her head that then radiated through the back of her right neck and into her left anterior chest.  The sensation then lingered in her left chest.  She tells me she has had the sensation of fullness/pressure in her head before that has moved to the back of her neck into her chest, however it is never lingered and her chest is long.  She reports feeling better 5-6 minutes after receiving nitro.  The patient also tried to roll onto her left side to go back to sleep when she felt the pain again, so she laid flat on her back and the pain went away.  Ordered chest x-ray and troponin x2. Also ordered stat H&H in setting of patient's recent blood loss anemia.  FAMILY MEDICINE TEACHING SERVICE Patient - Please contact intern pager [phone] (via phone or AMION, login: [email or web address]) for questions regarding care. Text pages welcome. DO NOT page or secure chat the listed attending provider unless there is no answer from the number above.   Milus Banister, [place], PGY-3 11/26/2020 11:48 PM

## 2020-11-26 NOTE — Evaluation (Signed)
Occupational Therapy Evaluation Patient Details Name: ANNALAURA SAUSEDA MRN: [id number] DOB: 1975-11-24 Today's Date: 11/26/2020    History of Present Illness Patient is a 44 y/o female with PMH significant for anemia, asthma, and hypercholesteremia. Patient recently diagnosed with COVID on 11/16/20 who presented to ED following syncopal episode. Patient was found to have symptomatic anemia and small acute infarcts L MCA.   Clinical Impression   Pt in bathroom upon arrival. Pt is Ind with ADLs/selfcare and Mod I with functional mobility. All education completed and no further acute OT services are indicated at this time. OT will sign off     Follow Up Recommendations  No OT follow up    Equipment Recommendations  None recommended by OT    Recommendations for Other Services       Precautions / Restrictions Precautions Precautions: Fall Restrictions Weight Bearing Restrictions: No      Mobility Bed Mobility Overal bed mobility: Modified Independent                  Transfers Overall transfer level: Modified independent Equipment used: None                  Balance Overall balance assessment: No apparent balance deficits (not formally assessed)                                         ADL either performed or assessed with clinical judgement   ADL Overall ADL's : Independent                                       General ADL Comments: pt in bathroom toileting upon arrival     Vision Baseline Vision/History: No visual deficits Patient Visual Report: No change from baseline       Perception     Praxis      Pertinent Vitals/Pain Pain Assessment: No/denies pain     Hand Dominance Right   Extremity/Trunk Assessment Upper Extremity Assessment Upper Extremity Assessment: Overall WFL for tasks assessed   Lower Extremity Assessment Lower Extremity Assessment: Defer to PT evaluation   Cervical / Trunk  Assessment Cervical / Trunk Assessment: Normal   Communication Communication Communication: No difficulties   Cognition Arousal/Alertness: Awake/alert Behavior During Therapy: WFL for tasks assessed/performed Overall Cognitive Status: Within Functional Limits for tasks assessed                                     General Comments       Exercises     Shoulder Instructions      Home Living Family/patient expects to be discharged to:: Private residence Living Arrangements: Children Available Help at Discharge: Family Type of Home: House Home Access: Level entry     Home Layout: One level     Bathroom Shower/Tub: Teacher, early years/pre: Standard     Home Equipment: None          Prior Functioning/Environment Level of Independence: Independent        Comments: working, driving, independent with ADLs and mobility        OT Problem List: Decreased activity tolerance      OT Treatment/Interventions:      OT Goals(Current  goals can be found in the care plan section) Acute Rehab OT Goals Patient Stated Goal: to go home OT Goal Formulation: With patient  OT Frequency:     Barriers to D/C:            Co-evaluation              AM-PAC OT "6 Clicks" Daily Activity     Outcome Measure Help from another person eating meals?: None Help from another person taking care of personal grooming?: None Help from another person toileting, which includes using toliet, bedpan, or urinal?: None Help from another person bathing (including washing, rinsing, drying)?: None Help from another person to put on and taking off regular upper body clothing?: None Help from another person to put on and taking off regular lower body clothing?: None 6 Click Score: 24   End of Session    Activity Tolerance: Patient tolerated treatment well Patient left: in bed;with call bell/phone within reach  OT Visit Diagnosis: Muscle weakness (generalized)  (M62.81)                Time: 6283-1517 OT Time Calculation (min): 25 min Charges:  OT General Charges $OT Visit: 1 Visit OT Evaluation $OT Eval Low Complexity: 1 Low    Britt Bottom 11/26/2020, 4:01 PM

## 2020-11-26 NOTE — Progress Notes (Signed)
Mindy, RN with RRT is present at bedside assessing pt.

## 2020-11-26 NOTE — Progress Notes (Signed)
  Echocardiogram 2D Echocardiogram has been performed.  Bobbye Charleston 11/26/2020, 11:58 AM

## 2020-11-26 NOTE — Progress Notes (Addendum)
Family Medicine Teaching Service Daily Progress Note Intern Pager: [phone]  Patient name: Wendy Ross Medical record number: [id number] Date of birth: 1976-11-02 Age: 45 y.o. Gender: female  Primary Care Provider: Patient, No Pcp Per Consultants: Neuro Code Status: FULL  Pt Overview and Major Events to Date:  1/19: admitted, transfused 1u pRBCs  Assessment and Plan:  ALLISON DESHOTELS is a 45 y.o. female who presented with syncope and was admitted for symptomatic anemia and acute CVA. PMHx is significant for anemia, menorrhagia, and HLD.  Symptomatic Anemia  Iron Deficiency Hgb 5.6 on admission, improved to 6.3 s/p 1u pRBCs. Patient received an additional unit and post-transfusion CBC is pending. Iron studies revealed iron 19, ferritin of 1, and saturation ratio 4 suggesting significant iron deficiency. Vitals have been stable (HR in the 50s-60s, most recent BP 114/75). No active bleeding. -Continue PO ferrous sulfate 325mg  daily -Consider IV iron -f/u post-transfusion hemoglobin -Outpatient OBGYN workup for menorrhagia  Acute CVA MRI on admission showed small acute infarcts within the left MCA vascular territory and watershed territories. Possibly due to severe anemia and hypoperfusion. She has no focal deficits on neuro exam. Hgb A1c normal at 5.3%, TSH normal, lipid panel showed HDL 33 and LDL 95. -Neuro following, appreciate recommendations -MRA head/neck and TTE for additional workup per neuro -ASA 81mg  daily, Clopidogrel 75mg  daily x3 weeks per neuro -Will start Atorvastatin 40mg  daily -PT/OT eval and treat  Tobacco Use Smokes 1PPD. -Nicotine patch -Smoking cessation counseling  FEN/GI: Regular diet PPx: SCDs   Status is: Observation The patient remains OBS appropriate and will d/c before 2 midnights.  Dispo: The patient is from: Home              Anticipated d/c is to: Home              Anticipated d/c date is: 1 day              Patient currently is not  medically stable to d/c.   Subjective:  Patient reports significant nausea after receiving her 1u blood transfusion. Feels better now. Also reports another episode of strange sensation in her R hand, but this seems to have resolved. No other complaints this morning.   Objective: Temp:  [98 F (36.7 C)-98.5 F (36.9 C)] 98.3 F (36.8 C) (01/20 0354) Pulse Rate:  [53-131] 61 (01/20 0354) Resp:  [13-22] 17 (01/20 0354) BP: (93-128)/(36-89) 113/78 (01/20 0354) SpO2:  [92 %-100 %] 100 % (01/20 0354) Weight:  [68 kg] 68 kg (01/19 1237) Physical Exam: General: alert, resting comfortably in bed, NAD Cardiovascular: RRR, normal S1/S2 without m/r/g Respiratory: normal WOB on room air, lungs CTAB Abdomen: +BS, soft, nontender, nondistended Extremities: no peripheral edema, no calf tenderness Neuro: CN II-VII intact, 5/5 strength in all extremities, sensation intact in all extremities, normal speech  Laboratory: Recent Labs  Lab 11/25/20 1241 11/26/20 0026  WBC 5.1 5.4  HGB 5.6* 6.3*  HCT 23.0* 23.4*  PLT 660* 576*   Recent Labs  Lab 11/25/20 1241  NA 139  K 3.3*  CL 108  CO2 23  BUN <5*  CREATININE 0.69  CALCIUM 8.7*  PROT 6.5  BILITOT 0.4  ALKPHOS 33*  ALT 13  AST 17  GLUCOSE 94   Results for MACIE, BAUM (MRN [id number]) as of 11/26/2020 09:12  Ref. Range 11/26/2020 06:53  Total CHOL/HDL Ratio Latest Units: RATIO 4.2  Cholesterol Latest Ref Range: 0 - 200 mg/dL 139  HDL Cholesterol Latest Ref  Range: >40 mg/dL 33 (L)  LDL (calc) Latest Ref Range: 0 - 99 mg/dL 95  Triglycerides Latest Ref Range: <150 mg/dL 54  VLDL Latest Ref Range: 0 - 40 mg/dL 11   Results for AINSLEIGH, KAKOS (MRN [id number]) as of 11/26/2020 09:12  Ref. Range 11/26/2020 06:53  Total CHOL/HDL Ratio Latest Units: RATIO 4.2  Cholesterol Latest Ref Range: 0 - 200 mg/dL 139  HDL Cholesterol Latest Ref Range: >40 mg/dL 33 (L)  LDL (calc) Latest Ref Range: 0 - 99 mg/dL 95  Triglycerides Latest Ref  Range: <150 mg/dL 54  VLDL Latest Ref Range: 0 - 40 mg/dL 11   A1c 5.3% TSH 0.968  Imaging/Diagnostic Tests: MR BRAIN WO CONTRAST Result Date: 11/25/2020 IMPRESSION: Examination limited by artifact as described. Numerous small acute cortical/subcortical infarcts within the left frontal lobe, left parietal lobe and left temporoparietal junction (left MCA vascular territory as well as left MCA/ACA and MCA/PCA watershed territories). Mild-to-moderate mucosal thickening within the bilateral ethmoid and maxillary sinuses. Electronically Signed   By: Kellie Simmering DO   On: 11/25/2020 17:04     Alcus Dad, MD 11/26/2020, 6:39 AM PGY-1, [facility] Intern pager: [phone], text pages welcome

## 2020-11-26 NOTE — Progress Notes (Signed)
STROKE TEAM PROGRESS NOTE   INTERVAL HISTORY No acute events overnight.  Patient received 2 units PRBCs.   She became sick with COVID about 10 days ago. Currently asymptomatic.   Discussed and encouraged smoking cessation.  She presented with syncopal episode followed by right-sided weakness and numbness which appears to be improving but MRI scan does confirm multiple small embolic left frontal, parietal and temporal parietal junction cortical and subcortical infarcts.  MRI of the brain is motion degraded but does not show significant large vessel stenosis.  Echocardiogram, carotid Doppler are pending.  She denies any prior history of strokes, TIAs, deep vein thrombosis, recurrent miscarriages FAMILY history of stroke or MI at a young age  14:   11/26/20 0155 11/26/20 0300 11/26/20 0354 11/26/20 0812  BP:  97/67 113/78 114/75  Pulse:  (!) 55 61 (!) 55  Resp:  16 17 14   Temp: 98.3 F (36.8 C)  98.3 F (36.8 C) 98.4 F (36.9 C)  TempSrc: Oral  Oral Oral  SpO2: 100% 100% 100% 100%  Weight:      Height:       Covid-19 Nucleic Acid Test Results Lab Results  Component Value Date   SARSCOV2 POSITIVE (A) 11/16/2020   CBC:  Recent Labs  Lab 11/25/20 1241 11/26/20 0026 11/26/20 1126  WBC 5.1 5.4  --   NEUTROABS 3.4  --   --   HGB 5.6* 6.3* 7.6*  HCT 23.0* 23.4* 28.2*  MCV 70.6* 71.3*  --   PLT 660* 576*  --    Basic Metabolic Panel:  Recent Labs  Lab 11/25/20 1241  NA 139  K 3.3*  CL 108  CO2 23  GLUCOSE 94  BUN <5*  CREATININE 0.69  CALCIUM 8.7*   Lipid Panel:  Recent Labs  Lab 11/26/20 0653  CHOL 139  TRIG 54  HDL 33*  CHOLHDL 4.2  VLDL 11  LDLCALC 95   HgbA1c:  Recent Labs  Lab 11/26/20 0653  HGBA1C 5.3   Urine Drug Screen: PENDING  Alcohol Level No results for input(s): ETH in the last 168 hours.  IMAGING past 24 hours MR ANGIO HEAD WO CONTRAST  Result Date: 11/26/2020 CLINICAL DATA:  Stroke follow-up EXAM: MRA HEAD WITHOUT CONTRAST  TECHNIQUE: Angiographic images of the Circle of Willis were obtained using MRA technique without intravenous contrast. COMPARISON:  MRI of the brain November 25, 2020 FINDINGS: The study is significantly degraded by motion. Normal flow related enhancement is seen in the upper cervical and intracranial internal carotid arteries, bilateral ACA and MCA vascular trees without evidence of high-grade stenosis or occlusion. Normal flow related enhancement of the intracranial vertebral arteries, basilar artery and bilateral posterior cerebral arteries without evidence of high-grade stenosis or proximal occlusion. No intracranial aneurysm identified although a small aneurysm could be missed due to motion related artifact. IMPRESSION: Significantly motion degraded study. No evidence of high-grade stenosis, proximal occlusion, or intracranial aneurysm. Electronically Signed   By: Pedro Earls M.D.   On: 11/26/2020 10:08   MR BRAIN WO CONTRAST  Result Date: 11/25/2020 CLINICAL DATA:  Numbness arm/leg. Additional history provided: Loss of consciousness, numbness. EXAM: MRI HEAD WITHOUT CONTRAST TECHNIQUE: Multiplanar, multiecho pulse sequences of the brain and surrounding structures were obtained without intravenous contrast. COMPARISON:  Noncontrast head CT performed earlier today 11/25/2020. FINDINGS: Brain: Artifact limits evaluation on the sagittal T1 weighted sequence, axial T2 weighted sequence and axial T2/FLAIR sequence. Cerebral volume is normal. There are numerous small acute cortical/subcortical infarcts  within the left frontal lobe, parietal lobe and temporoparietal junction (left MCA vascular territory as well as left MCA/ACA and MCA/PCA watershed territories). No significant mass effect. No evidence of air measure conversion. No evidence of intracranial mass. No chronic intracranial blood products. No extra-axial fluid collection. No midline shift. Partially empty sella turcica. Incidentally  noted cavum septum pellucidum and cavum vergae. Vascular: Expected proximal arterial flow voids. Skull and upper cervical spine: Within described limitations, no focal marrow lesion is identified. Sinuses/Orbits: Visualized orbits show no acute finding. Mild-to-moderate bilateral ethmoid and maxillary sinus mucosal thickening. IMPRESSION: Examination limited by artifact as described. Numerous small acute cortical/subcortical infarcts within the left frontal lobe, left parietal lobe and left temporoparietal junction (left MCA vascular territory as well as left MCA/ACA and MCA/PCA watershed territories). Mild-to-moderate mucosal thickening within the bilateral ethmoid and maxillary sinuses. Electronically Signed   By: Kellie Simmering DO   On: 11/25/2020 17:04   ECHOCARDIOGRAM COMPLETE BUBBLE STUDY  Result Date: 11/26/2020    ECHOCARDIOGRAM REPORT   Patient Name:   Wendy Ross Date of Exam: 11/26/2020 Medical Rec #:  [id number]       Height:       64.0 in Accession #:    [id number]      Weight:       150.0 lb Date of Birth:  [birth year]       BSA:          1.731 m Patient Age:    53 years        BP:           114/75 mmHg Patient Gender: F               HR:           57 bpm. Exam Location:  Inpatient Procedure: 2D Echo, 3D Echo, Saline Contrast Bubble Study, Color Doppler and            Cardiac Doppler Indications:    Stroke  History:        Patient has no prior history of Echocardiogram examinations.                 Stroke. Covid positive.  Sonographer:    Roseanna Rainbow RDCS Referring Phys: [id number] Kinnie Feil  Sonographer Comments: Dr Leonie Man requested the echo be changed to include bubble study. Exam completion delayed to get supplies. IMPRESSIONS  1. Left ventricular ejection fraction, by estimation, is 65 to 70%. The left ventricle has normal function. The left ventricle has no regional wall motion abnormalities. Left ventricular diastolic parameters were normal.  2. Right ventricular systolic function is normal. The  right ventricular size is normal.  3. Left atrial size was moderately dilated.  4. Right atrial size was mildly dilated.  5. The mitral valve is normal in structure. Trivial mitral valve regurgitation.  6. The aortic valve is tricuspid. Aortic valve regurgitation is not visualized. No aortic stenosis is present.  7. The inferior vena cava is dilated in size with <50% respiratory variability, suggesting right atrial pressure of 15 mmHg.  8. Agitated saline contrast bubble study was negative, with no evidence of any interatrial shunt. Comparison(s): No prior Echocardiogram. Conclusion(s)/Recommendation(s): Normal biventricular function without evidence of hemodynamically significant valvular heart disease. FINDINGS  Left Ventricle: Left ventricular ejection fraction, by estimation, is 65 to 70%. The left ventricle has normal function. The left ventricle has no regional wall motion abnormalities. The left ventricular internal cavity size was normal in size. There  is  borderline left ventricular hypertrophy. Left ventricular diastolic parameters were normal. Right Ventricle: The right ventricular size is normal. No increase in right ventricular wall thickness. Right ventricular systolic function is normal. Left Atrium: Left atrial size was moderately dilated. Right Atrium: Right atrial size was mildly dilated. Pericardium: Trivial pericardial effusion is present. Mitral Valve: The mitral valve is normal in structure. Trivial mitral valve regurgitation. Tricuspid Valve: The tricuspid valve is normal in structure. Tricuspid valve regurgitation is trivial. Aortic Valve: The aortic valve is tricuspid. Aortic valve regurgitation is not visualized. No aortic stenosis is present. Pulmonic Valve: The pulmonic valve was grossly normal. Pulmonic valve regurgitation is not visualized. Aorta: The aortic root, ascending aorta, aortic arch and descending aorta are all structurally normal, with no evidence of dilitation or  obstruction. Venous: The inferior vena cava is dilated in size with less than 50% respiratory variability, suggesting right atrial pressure of 15 mmHg. IAS/Shunts: The atrial septum is grossly normal. Agitated saline contrast was given intravenously to evaluate for intracardiac shunting. Agitated saline contrast bubble study was negative, with no evidence of any interatrial shunt.  LEFT VENTRICLE PLAX 2D LVIDd:         4.80 cm      Diastology LVIDs:         2.80 cm      LV e' medial:    9.46 cm/s LV PW:         1.30 cm      LV E/e' medial:  10.2 LV IVS:        1.20 cm      LV e' lateral:   12.90 cm/s LVOT diam:     2.05 cm      LV E/e' lateral: 7.5 LV SV:         94 LV SV Index:   54 LVOT Area:     3.30 cm                              3D Volume EF: LV Volumes (MOD)            3D EF:        70 % LV vol d, MOD A2C: 111.0 ml LV EDV:       144 ml LV vol d, MOD A4C: 93.4 ml  LV ESV:       44 ml LV vol s, MOD A2C: 39.4 ml  LV SV:        100 ml LV vol s, MOD A4C: 39.2 ml LV SV MOD A2C:     71.6 ml LV SV MOD A4C:     93.4 ml LV SV MOD BP:      67.7 ml RIGHT VENTRICLE             IVC RV S prime:     18.10 cm/s  IVC diam: 2.60 cm TAPSE (M-mode): 2.6 cm LEFT ATRIUM             Index       RIGHT ATRIUM           Index LA diam:        3.90 cm 2.25 cm/m  RA Area:     18.50 cm LA Vol (A2C):   70.1 ml 40.49 ml/m RA Volume:   48.10 ml  27.78 ml/m LA Vol (A4C):   53.5 ml 30.90 ml/m LA Biplane Vol: 64.2 ml 37.08 ml/m  AORTIC VALVE LVOT  Vmax:   136.00 cm/s LVOT Vmean:  91.000 cm/s LVOT VTI:    0.284 m  AORTA Ao Root diam: 3.20 cm Ao Asc diam:  3.20 cm MITRAL VALVE MV Area (PHT): 4.15 cm    SHUNTS MV Decel Time: 183 msec    Systemic VTI:  0.28 m MV E velocity: 96.30 cm/s  Systemic Diam: 2.05 cm MV A velocity: 70.00 cm/s MV E/A ratio:  1.38 Buford Dresser MD Electronically signed by Buford Dresser MD Signature Date/Time: 11/26/2020/1:13:29 PM    Final    PHYSICAL EXAM Constitutional: Appears well-developed and  well-nourished.  Psych: Affect appropriate to situation Eyes: No scleral injection HENT: No OP obstruction. Head: Normocephalic.  Cardiovascular: Normal rate and regular rhythm.  Respiratory: Effort normal, symmetric excursions bilaterally, no audible wheezing. GI: Soft.  No distension. There is no tenderness.  Skin: WDI  Neuro: Mental Status: Patient is awake, alert, oriented to person, place, month, year, and situation. Patient is able to give a clear and coherent history. No signs of aphasia or neglect. Cranial Nerves: II: Visual Fields are full. Pupils are equal, round, and reactive to light. III,IV, VI: EOMI without ptosis or diploplia.  V: Facial sensation is symmetric to temperature VII: Facial movement is symmetric.  VIII: hearing is intact to voice X: Uvula elevates symmetrically XI: Shoulder shrug is symmetric. XII: tongue is midline without atrophy or fasciculations.  Motor: Tone is normal. Bulk is normal. 5/5 strength was present in all four extremities.  Diminished fine finger movements on the right.  Orbits left over right upper extremity.  Mild right grip weakness.  Mild right hip flexor and ankle dorsiflexor weakness. Sensory: Sensation is symmetric to light touch and temperature in the arms and legs. Deep Tendon Reflexes: 2+ and symmetric in the biceps and patellae. Plantars: Toes are downgoing bilaterally. Cerebellar: FNF and HKS are intact bilaterally.  ASSESSMENT/PLAN Wendy Ross is a 45 y.o. female with Asthma, HLD who presented with lightheadedness and unsteady gait with right arm and leg weakness with ground level fall. She may have lost consciousness but duration is not clear. While she was down, there was no loss of awareness and noticed she had right sided numbness/weakness. During the event she felt as though her right arm and leg were shaking but bystanders did not witness any movement. No tongue biting or post ictal state.  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    MRI Brain: numerous small acute infarcts in the left MCA as well as left MCA/ACA and MCA/PCA watershed territories  MRA: Significantly motion degraded study. No evidence of high-grade stenosis, proximal occlusion, or intracranial aneurysm.  Carotid Doppler  PENDING  2D Echo: EF 65-70% with negative bubble study  LDL 95  HgbA1c 5.3  VTE prophylaxis is recommended if primary team deems stable from a bleeding/anemia standpoint.     Diet   Diet regular Room service appropriate? Yes; Fluid consistency: Thin   Aspirin 81mg  daily if primary team deems safe in setting of anemia/bleeding concerns.    Therapy recommendations:  PENDING  Disposition:  PENDING  Post Stroke Blood Pressure Management . Permissive hypertension (OK if < 220/120) but gradually normalize in 5-7 days . Long-term BP goal normotensive  Hyperlipidemia  Home meds: None  LDL 95, goal < 70  Atorvastatin 40 mg daily   Continue statin at discharge  Anemia/Iron Deficiency  S/p transfusion of 2 units PRBC  for hgb 5.6 ->6.3, post transfusion hgb pending   Outpatient OBGYN workup for menorrhagia  Management per primary team     COVID  Other Stroke Risk Factors  Cigarette smoker, currently smoking one ppd. Advised to stop smoking. Nicotine patch in use.    Substance abuse - UDS: PENDING  Other Active Problems    Hospital day # 0 She presented with this brief syncopal episode followed by right hemiparesis with MRI showing multiple embolic left hemispheric infarcts.  Recommend continue ongoing stroke work-up check lower extremity venous Dopplers for DVT given her being COVID-positive.  Check 2D echo with bubble study for PFO.  Continue ongoing stroke work-up.  Patient is not a good candidate for dual antiplatelet therapy given his significant anemia hence would recommend  only enteric-coated aspirin 81 mg daily for stroke prevention.  Patient was counseled to quit smoking.  Check urine drug screen,.  Treatment of COVID infection as per primary team greater than 50% time during this 35-minute visit was spent on counseling and coordination of care about her embolic stroke and answering questions. Antony Contras, MD To contact Stroke Continuity provider, please refer to [email or web address] After hours, contact General Neurology

## 2020-11-26 NOTE — ED Notes (Signed)
CBC has resulted, called blood bank and able to speak to staff there. Will get blood.

## 2020-11-26 NOTE — Plan of Care (Signed)

## 2020-11-26 NOTE — Progress Notes (Signed)
Interim Progress Note  Just spoke with RN Viann Fish, she gave me an update that Lab is in the patient's room right now check post transfusion H&H, as well as obtaining her other AM labs.   Will pass along to day team to follow up.   Milus Banister, [place], PGY-3 11/26/2020 6:59 AM

## 2020-11-26 NOTE — Progress Notes (Signed)
   11/26/20 2251  Vitals  BP 120/63  MAP (mmHg) 81  BP Location Right Arm  BP Method Automatic  Patient Position (if appropriate) Lying  Pulse Rate 65  Pulse Rate Source Dinamap  Resp (!) 24  Level of Consciousness  Level of Consciousness Alert  MEWS COLOR  MEWS Score Color Green  Oxygen Therapy  SpO2 100 %  O2 Device Room Air   Paged Neuro on call to notify that pt states she feels like she is having a heart attack. She stated she is having pain in her chest. Heart rhythm on the monitor is NSR with a HR of 65. She is following commands and answering all questions at this moment.

## 2020-11-27 ENCOUNTER — Inpatient Hospital Stay (HOSPITAL_COMMUNITY): Payer: Self-pay

## 2020-11-27 DIAGNOSIS — I639 Cerebral infarction, unspecified: Secondary | ICD-10-CM

## 2020-11-27 LAB — CBC
HCT: 28.2 % — ABNORMAL LOW (ref 36.0–46.0)
Hemoglobin: 7.6 g/dL — ABNORMAL LOW (ref 12.0–15.0)
MCH: 19.6 pg — ABNORMAL LOW (ref 26.0–34.0)
MCHC: 27 g/dL — ABNORMAL LOW (ref 30.0–36.0)
MCV: 72.7 fL — ABNORMAL LOW (ref 80.0–100.0)
Platelets: 529 10*3/uL — ABNORMAL HIGH (ref 150–400)
RBC: 3.88 MIL/uL (ref 3.87–5.11)
RDW: 23.3 % — ABNORMAL HIGH (ref 11.5–15.5)
WBC: 5.9 10*3/uL (ref 4.0–10.5)
nRBC: 0 % (ref 0.0–0.2)

## 2020-11-27 LAB — TYPE AND SCREEN
ABO/RH(D): O POS
Antibody Screen: NEGATIVE
Unit division: 0
Unit division: 0

## 2020-11-27 LAB — BPAM RBC
Blood Product Expiration Date: 202202182359
Blood Product Expiration Date: 202202242359
ISSUE DATE / TIME: 202201191830
ISSUE DATE / TIME: 202201200130
Unit Type and Rh: 5100
Unit Type and Rh: 5100

## 2020-11-27 LAB — TROPONIN I (HIGH SENSITIVITY): Troponin I (High Sensitivity): 4 ng/L (ref <18)

## 2020-11-27 MED ORDER — SODIUM CHLORIDE 0.9 % IV SOLN
510.0000 mg | Freq: Once | INTRAVENOUS | Status: AC
  Administered 2020-11-27: 510 mg via INTRAVENOUS
  Filled 2020-11-27: qty 17

## 2020-11-27 NOTE — Progress Notes (Signed)
STROKE TEAM PROGRESS NOTE   INTERVAL HISTORY No acute events overnight.  Denies new concerns. Feels she is back to baseline.   Transthoracic echo was unremarkable.  Lower extremity venous Dopplers were negative for DVT.  Transcranial Doppler studies are normal.  Carotid ultrasound shows no significant extracranial stenosis.  Transcranial, bubble study is pending Hemoglobin is stable at 7.6 and hematocrit at 28.2 Vitals:   11/26/20 2328 11/26/20 2330 11/27/20 0500 11/27/20 0820  BP: 106/78 110/77 105/70 114/76  Pulse: 68 71  65  Resp: 15 19  14   Temp:   98 F (36.7 C) 97.8 F (36.6 C)  TempSrc:   Oral Oral  SpO2: 97% 100%  100%  Weight:      Height:       Covid-19 Nucleic Acid Test Results Lab Results  Component Value Date   SARSCOV2 POSITIVE (A) 11/16/2020   CBC:  Recent Labs  Lab 11/25/20 1241 11/26/20 0026 11/26/20 1126 11/27/20 0235  WBC 5.1 5.4  --  5.9  NEUTROABS 3.4  --   --   --   HGB 5.6* 6.3* 7.6* 7.6*  HCT 23.0* 23.4* 28.2* 28.2*  MCV 70.6* 71.3*  --  72.7*  PLT 660* 576*  --  123XX123*   Basic Metabolic Panel:  Recent Labs  Lab 11/25/20 1241  NA 139  K 3.3*  CL 108  CO2 23  GLUCOSE 94  BUN <5*  CREATININE 0.69  CALCIUM 8.7*   Lipid Panel:  Recent Labs  Lab 11/26/20 0653  CHOL 139  TRIG 54  HDL 33*  CHOLHDL 4.2  VLDL 11  LDLCALC 95   HgbA1c:  Recent Labs  Lab 11/26/20 0653  HGBA1C 5.3   Urine Drug Screen: PENDING  Alcohol Level No results for input(s): ETH in the last 168 hours.  IMAGING past 24 hours DG CHEST PORT 1 VIEW  Result Date: 11/27/2020 CLINICAL DATA:  Chest pain EXAM: PORTABLE CHEST 1 VIEW COMPARISON:  Radiograph 04/25/2013 FINDINGS: No consolidation, features of edema, pneumothorax, or effusion. Pulmonary vascularity is normally distributed. The cardiomediastinal contours are unremarkable. No acute osseous or soft tissue abnormality. IMPRESSION: No acute cardiopulmonary abnormality. Electronically Signed   By: Lovena Le  M.D.   On: 11/27/2020 00:38   VAS US CAROTID  Result Date: 11/27/2020 Carotid Arterial Duplex Study Indications:       CVA. Risk Factors:      None. Comparison Study:  No prior studies. Performing Technologist: Oliver Hum RVT  Examination Guidelines: A complete evaluation includes B-mode imaging, spectral Doppler, color Doppler, and power Doppler as needed of all accessible portions of each vessel. Bilateral testing is considered an integral part of a complete examination. Limited examinations for reoccurring indications may be performed as noted.  Right Carotid Findings: +----------+--------+--------+--------+-----------------------+--------+             PSV cm/s EDV cm/s Stenosis Plaque Description      Comments  +----------+--------+--------+--------+-----------------------+--------+  CCA Prox   93       24                smooth and heterogenous           +----------+--------+--------+--------+-----------------------+--------+  CCA Distal 72       27                smooth and heterogenous           +----------+--------+--------+--------+-----------------------+--------+  ICA Prox   94       27                                                  +----------+--------+--------+--------+-----------------------+--------+  ICA Distal 90       33                                        tortuous  +----------+--------+--------+--------+-----------------------+--------+  ECA        72       15                                                  +----------+--------+--------+--------+-----------------------+--------+ +----------+--------+-------+--------+-------------------+             PSV cm/s EDV cms Describe Arm Pressure (mmHG)  +----------+--------+-------+--------+-------------------+  Subclavian 118                                            +----------+--------+-------+--------+-------------------+ +---------+--------+--+--------+--+---------+  Vertebral PSV cm/s 51 EDV cm/s 22 Antegrade   +---------+--------+--+--------+--+---------+  Left Carotid Findings: +----------+--------+--------+--------+-----------------------+--------+             PSV cm/s EDV cm/s Stenosis Plaque Description      Comments  +----------+--------+--------+--------+-----------------------+--------+  CCA Prox   119      32                smooth and heterogenous           +----------+--------+--------+--------+-----------------------+--------+  CCA Distal 86       30                smooth and heterogenous           +----------+--------+--------+--------+-----------------------+--------+  ICA Prox   68       32                smooth and heterogenous           +----------+--------+--------+--------+-----------------------+--------+  ICA Distal 76       36                                        tortuous  +----------+--------+--------+--------+-----------------------+--------+  ECA        77       15                                                  +----------+--------+--------+--------+-----------------------+--------+ +----------+--------+--------+--------+-------------------+             PSV cm/s EDV cm/s Describe Arm Pressure (mmHG)  +----------+--------+--------+--------+-------------------+  Subclavian 193                                             +----------+--------+--------+--------+-------------------+ +---------+--------+--+--------+--+---------+  Vertebral PSV cm/s 69 EDV cm/s 27 Antegrade  +---------+--------+--+--------+--+---------+   Summary: Right Carotid: Velocities in the right ICA are consistent with a 1-39% stenosis. Left Carotid: Velocities in the left ICA are consistent with a 1-39% stenosis. Vertebrals: Bilateral vertebral arteries demonstrate antegrade flow. *See table(s) above for  measurements and observations.  Electronically signed by Antony Contras MD on 11/27/2020 at 1:03:02 PM.    Final    VAS [place] LOWER EXTREMITY VENOUS (DVT)  Result Date: 11/27/2020  Lower Venous DVT Study Indications: Stroke.  Risk  Factors: None identified. Comparison Study: No prior studies. Performing Technologist: Oliver Hum RVT  Examination Guidelines: A complete evaluation includes B-mode imaging, spectral Doppler, color Doppler, and power Doppler as needed of all accessible portions of each vessel. Bilateral testing is considered an integral part of a complete examination. Limited examinations for reoccurring indications may be performed as noted. The reflux portion of the exam is performed with the patient in reverse Trendelenburg.  +---------+---------------+---------+-----------+----------+--------------+  RIGHT     Compressibility Phasicity Spontaneity Properties Thrombus Aging  +---------+---------------+---------+-----------+----------+--------------+  CFV       Full            Yes       Yes                                    +---------+---------------+---------+-----------+----------+--------------+  SFJ       Full                                                             +---------+---------------+---------+-----------+----------+--------------+  FV Prox   Full                                                             +---------+---------------+---------+-----------+----------+--------------+  FV Mid    Full                                                             +---------+---------------+---------+-----------+----------+--------------+  FV Distal Full                                                             +---------+---------------+---------+-----------+----------+--------------+  PFV       Full                                                             +---------+---------------+---------+-----------+----------+--------------+  POP       Full            Yes       Yes                                    +---------+---------------+---------+-----------+----------+--------------+  PTV       Full                                                              +---------+---------------+---------+-----------+----------+--------------+  PERO      Full                                                             +---------+---------------+---------+-----------+----------+--------------+   +---------+---------------+---------+-----------+----------+--------------+  LEFT      Compressibility Phasicity Spontaneity Properties Thrombus Aging  +---------+---------------+---------+-----------+----------+--------------+  CFV       Full            Yes       Yes                                    +---------+---------------+---------+-----------+----------+--------------+  SFJ       Full                                                             +---------+---------------+---------+-----------+----------+--------------+  FV Prox   Full                                                             +---------+---------------+---------+-----------+----------+--------------+  FV Mid    Full                                                             +---------+---------------+---------+-----------+----------+--------------+  FV Distal Full                                                             +---------+---------------+---------+-----------+----------+--------------+  PFV       Full                                                             +---------+---------------+---------+-----------+----------+--------------+  POP       Full            Yes       Yes                                    +---------+---------------+---------+-----------+----------+--------------+  PTV       Full                                                             +---------+---------------+---------+-----------+----------+--------------+  PERO      Full                                                             +---------+---------------+---------+-----------+----------+--------------+     Summary: RIGHT: - There is no evidence of deep vein thrombosis in the lower extremity.  - No cystic structure found in  the popliteal fossa.  LEFT: - There is no evidence of deep vein thrombosis in the lower extremity.  - No cystic structure found in the popliteal fossa.  *See table(s) above for measurements and observations.    Preliminary    VAS [place] TRANSCRANIAL DOPPLER  Result Date: 11/27/2020  Transcranial Doppler Indications: Stroke. Comparison Study: No prior studies. Performing Technologist: Oliver Hum RVT  Examination Guidelines: A complete evaluation includes B-mode imaging, spectral Doppler, color Doppler, and power Doppler as needed of all accessible portions of each vessel. Bilateral testing is considered an integral part of a complete examination. Limited examinations for reoccurring indications may be performed as noted.  +----------+-------------+----------+-----------+-------+  RIGHT TCD  Right VM (cm) Depth (cm) Pulsatility Comment  +----------+-------------+----------+-----------+-------+  MCA            66.00                    0.7              +----------+-------------+----------+-----------+-------+  ACA           -47.00                   0.74              +----------+-------------+----------+-----------+-------+  Term ICA       51.00                   0.75              +----------+-------------+----------+-----------+-------+  PCA            42.00                   0.78              +----------+-------------+----------+-----------+-------+  Opthalmic      11.00                   0.97              +----------+-------------+----------+-----------+-------+  ICA siphon     60.00                   0.76              +----------+-------------+----------+-----------+-------+  Vertebral     -60.00                   0.77              +----------+-------------+----------+-----------+-------+  Distal  ICA     28.00                   0.88              +----------+-------------+----------+-----------+-------+  +----------+------------+----------+-----------+-------+  LEFT TCD   Left VM (cm) Depth  (cm) Pulsatility Comment  +----------+------------+----------+-----------+-------+  MCA           63.00                   0.74              +----------+------------+----------+-----------+-------+  ACA           -46.00                  0.85              +----------+------------+----------+-----------+-------+  Term ICA      45.00                   0.85              +----------+------------+----------+-----------+-------+  PCA           44.00                   0.78              +----------+------------+----------+-----------+-------+  Opthalmic     13.00                   1.48              +----------+------------+----------+-----------+-------+  ICA siphon    15.00                   1.02              +----------+------------+----------+-----------+-------+  Vertebral     -40.00                  0.66              +----------+------------+----------+-----------+-------+  Distal ICA    39.00                   1.17              +----------+------------+----------+-----------+-------+  +------------+-------+-------+               VM cm/s Comment  +------------+-------+-------+  Prox Basilar -56.00   0.73    +------------+-------+-------+ +----------------------+----+  Right Lindegaard Ratio 2.36  +----------------------+----+ +---------------------+----+  Left Lindegaard Ratio 1.62  +---------------------+----+  Summary: This was a normal transcranial Doppler study, with normal flow direction and velocity of all identified vessels of the anterior and posterior circulations, with no evidence of stenosis, vasospasm or occlusion. There was no evidence of intracranial disease. *See table(s) above for TCD measurements and observations.  Diagnosing physician: Antony Contras MD Electronically signed by Antony Contras MD on 11/27/2020 at 1:03:25 PM.    Final    PHYSICAL EXAM Constitutional: Appears well-developed and well-nourished middle-aged African-American lady not in distress.  Psych: Affect appropriate to situation Eyes:  No scleral injection HENT: No OP obstruction. Head: Normocephalic.  Cardiovascular: Normal rate and regular rhythm.  Respiratory: Effort normal, symmetric excursions bilaterally, no audible wheezing. GI: Soft.  No distension. There is no tenderness.  Skin: WDI  Neuro: Mental Status: Patient is awake, alert, oriented to person, place, month, year, and situation. Patient is able to give a clear and coherent history. No  signs of aphasia or neglect. Cranial Nerves: II: Visual Fields are full. Pupils are equal, round, and reactive to light. III,IV, VI: EOMI without ptosis or diploplia.  V: Facial sensation is symmetric to temperature VII: Facial movement is symmetric.  VIII: hearing is intact to voice X: Uvula elevates symmetrically XI: Shoulder shrug is symmetric. XII: tongue is midline without atrophy or fasciculations.  Motor: Tone is normal. Bulk is normal. 5/5 strength was present in all four extremities.  Diminished fine finger movements on the right.  Orbits left over right upper extremity.  Mild right grip weakness.  Mild right hip flexor and ankle dorsiflexor weakness. Sensory: Sensation is symmetric to light touch and temperature in the arms and legs. Deep Tendon Reflexes: 2+ and symmetric in the biceps and patellae. Plantars: Toes are downgoing bilaterally. Cerebellar: FNF and HKS are intact bilaterally.  ASSESSMENT/PLAN Ms. DERYN MASSENGALE is a 45 y.o. female with Asthma, HLD who presented with lightheadedness and unsteady gait with right arm and leg weakness with ground level fall. She may have lost consciousness but duration is not clear. While she was down, there was no loss of awareness and noticed she had right sided numbness/weakness. During the event she felt as though her right arm and leg were shaking but bystanders did not witness any movement. No tongue biting or post ictal state.  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    MRI Brain: numerous small acute infarcts in the left MCA as well as left MCA/ACA and MCA/PCA watershed territories  MRA: Significantly motion degraded study. No evidence of high-grade stenosis, proximal occlusion, or intracranial aneurysm.  Carotid Doppler  Summary:  Right Carotid: Velocities in the right ICA are consistent with a 1-39%  stenosis. Left Carotid: Velocities in the left ICA are consistent with a 1-39%  stenosis. Vertebrals: Bilateral vertebral arteries demonstrate antegrade flow.   2D Echo: EF 65-70% with negative bubble study  Bilat LE Doppler: Negative (preliminary read)    Transcranial Doppler: Negative   LDL 95  HgbA1c 5.3  VTE prophylaxis is recommended if primary team deems stable from a bleeding/anemia standpoint.     Diet   Diet regular Room service appropriate? Yes; Fluid consistency: Thin   Aspirin 81mg  daily if primary team deems safe in setting of anemia/bleeding concerns.  Therapy recommendations:  Cleared for dc home by PT and OT  Disposition:  Home   Post Stroke Blood Pressure Management  Permissive hypertension (OK if < 220/120) but gradually normalize in 5-7 days  Long-term BP goal normotensive  Hyperlipidemia  Home meds: None  LDL 95, goal < 70  Atorvastatin 40 mg daily   Continue statin at discharge  Anemia/Iron Deficiency  S/p transfusion of 2 units PRBC for hgb 5.6 ->6.3, post transfusion hgb pending   Outpatient OBGYN workup for menorrhagia  Feraheme infusion planned today   Management per primary team    COVID   No active symptoms, Management per primary team   Other Stroke Risk Factors  Cigarette smoker, currently smoking one ppd. Advised to stop smoking. Nicotine patch in use.   Substance abuse - UDS + for Cocaine  Other  Active Problems    Hospital day # 2 Plan continue mobilization out of bed.  Ongoing therapy consults.  Patient counseled to quit using cocaine.  Check transcranial Doppler bubble study for PFO.  Continue treatment for COVID-19 as per primary team.  Likely discharge home tomorrow.  Discussed with patient and Dr. Tawanna Solo.  Greater than 50% time during this 25-minute visit was spent in counseling and coordination of care about her stroke and answering questions. Antony Contras, MD To contact Stroke Continuity provider, please refer to [email or web address] After hours, contact General Neurology

## 2020-11-27 NOTE — Progress Notes (Signed)
Family Medicine Teaching Service Daily Progress Note Intern Pager: [phone]  Patient name: NORENE OLIVERI Medical record number: [id number] Date of birth: Oct 21, 1976 Age: 45 y.o. Gender: female  Primary Care Provider: Patient, No Pcp Per Consultants: Neuro Code Status: FULL  Pt Overview and Major Events to Date:  1/19: admitted, transfused 2u pRBCs  Assessment and Plan:  CHARMAIN DIOSDADO is a 45 y.o. female who presented with syncope and was admitted for symptomatic anemia and acute CVA. PMHx is significant for anemia, menorrhagia, and HLD.  Iron Deficiency Anemia Hgb stable at 7.6 this morning. Iron panel on admission revealed significant iron deficiency anemia.  -Continue PO ferrous sulfate 325mg  daily -Will give Feraheme infusion today -Daily CBC -Outpatient OBGYN for treatment of her menorrhagia  Acute CVA MRI on admission showed small acute infarcts within the left MCA vascular territory and watershed territories. Likely due to a combination of severe anemia, COVID, and cocaine use. She has no focal deficits on neuro exam. Stroke workup unremarkable thus far other than UDS +for cocaine. -Neuro following, appreciate recommendations -ASA 81mg  daily -Will discontinue Clopidogrel per neuro recs -Atorvastatin 40mg  daily -f/u transcranial and lower extremity US results  Chest Pain Patient had episode of chest pain overnight which resolved ~5 mins after receiving nitro. Vitals remained stable, EKG showed normal sinus rhythm without abnormalities, CXR normal, high sensitivity troponin 4. -Do not suspect cardiac etiology -Patient states pain originated in her head and radiated down into her chest -Continue to monitor  Tobacco Use   Cocaine Use Smokes 1PPD. UDS positive for cocaine. -Nicotine patch -Tobacco and cocaine cessation counseling -Patient desires nicotine patch on discharge if possible  Recent COVID Tested positive on 1/10. Completed quarantine period per [facility]  guidelines and hospital policy. -d/c precautions -encourage completion of vaccination on discharge  FEN/GI: Regular diet PPx: SCDs   Status is: Inpatient  Dispo: The patient is from: Home              Anticipated d/c is to: Home              Anticipated d/c date is: 1 day              Patient currently is not medically stable to d/c.   Subjective:  Patient with episode of chest pain overnight, which has resolved this morning. Patient has no complaints and would like to go home today.  Objective: Temp:  [97.8 F (36.6 C)-98.4 F (36.9 C)] 97.8 F (36.6 C) (01/21 0820) Pulse Rate:  [65-71] 65 (01/21 0820) Resp:  [14-24] 14 (01/21 0820) BP: (104-123)/(63-82) 114/76 (01/21 0820) SpO2:  [97 %-100 %] 100 % (01/21 0820) Physical Exam: General: alert, resting comfortably in bed, NAD Cardiovascular: RRR, normal S1/S2 without m/r/g Respiratory: normal WOB on room air, lungs CTAB Abdomen: +BS, soft, nontender, nondistended Extremities: no peripheral edema, no calf tenderness Neuro: CN II-VII intact, 5/5 strength in all extremities, sensation intact in all extremities, normal speech  Laboratory: Recent Labs  Lab 11/25/20 1241 11/26/20 0026 11/26/20 1126 11/27/20 0235  WBC 5.1 5.4  --  5.9  HGB 5.6* 6.3* 7.6* 7.6*  HCT 23.0* 23.4* 28.2* 28.2*  PLT 660* 576*  --  529*   Recent Labs  Lab 11/25/20 1241  NA 139  K 3.3*  CL 108  CO2 23  BUN <5*  CREATININE 0.69  CALCIUM 8.7*  PROT 6.5  BILITOT 0.4  ALKPHOS 33*  ALT 13  AST 17  GLUCOSE 94   UDS: positive  for cocaine   Imaging/Diagnostic Tests: MR ANGIO HEAD WO CONTRAST Result Date: 11/26/2020 IMPRESSION: Significantly motion degraded study. No evidence of high-grade stenosis, proximal occlusion, or intracranial aneurysm. Electronically Signed   By: Pedro Earls M.D.   On: 11/26/2020 10:08   ECHOCARDIOGRAM COMPLETE BUBBLE STUDY Result Date: 11/26/2020 IMPRESSIONS  1. Left ventricular ejection  fraction, by estimation, is 65 to 70%. The left ventricle has normal function. The left ventricle has no regional wall motion abnormalities. Left ventricular diastolic parameters were normal.  2. Right ventricular systolic function is normal. The right ventricular size is normal.  3. Left atrial size was moderately dilated.  4. Right atrial size was mildly dilated.  5. The mitral valve is normal in structure. Trivial mitral valve regurgitation.  6. The aortic valve is tricuspid. Aortic valve regurgitation is not visualized. No aortic stenosis is present.  7. The inferior vena cava is dilated in size with <50% respiratory variability, suggesting right atrial pressure of 15 mmHg.  8. Agitated saline contrast bubble study was negative, with no evidence of any interatrial shunt. Comparison(s): No prior Echocardiogram. Conclusion(s)/Recommendation(s): Normal biventricular function without evidence of hemodynamically significant valvular heart disease.   DG CHEST PORT 1 VIEW Result Date: 11/27/2020 IMPRESSION: No acute cardiopulmonary abnormality. Electronically Signed   By: Lovena Le M.D.   On: 11/27/2020 00:38    Alcus Dad, MD 11/27/2020, 11:41 AM PGY-1, [facility] Intern pager: [phone], text pages welcome

## 2020-11-27 NOTE — Progress Notes (Signed)
Followed up with lab about STAT H/H. Lab tech stated that she is on the elevator coming to [place] now.

## 2020-11-27 NOTE — Progress Notes (Signed)
TCD's, Carotid artery duplex, and bilateral lower extremity venous duplex has been completed. Preliminary results can be found in CV Proc through chart review.   11/27/20 10:48 AM Carlos Levering RVT

## 2020-11-27 NOTE — Plan of Care (Signed)
  Problem: Education: Goal: Knowledge of General Education information will improve Description: Including pain rating scale, medication(s)/side effects and non-pharmacologic comfort measures Outcome: Progressing   Problem: Health Behavior/Discharge Planning: Goal: Ability to manage health-related needs will improve Outcome: Progressing   Problem: Activity: Goal: Risk for activity intolerance will decrease Outcome: Progressing   

## 2020-11-28 ENCOUNTER — Inpatient Hospital Stay (HOSPITAL_COMMUNITY): Payer: Self-pay

## 2020-11-28 DIAGNOSIS — F1490 Cocaine use, unspecified, uncomplicated: Secondary | ICD-10-CM

## 2020-11-28 DIAGNOSIS — I639 Cerebral infarction, unspecified: Secondary | ICD-10-CM

## 2020-11-28 DIAGNOSIS — F17200 Nicotine dependence, unspecified, uncomplicated: Secondary | ICD-10-CM

## 2020-11-28 LAB — BASIC METABOLIC PANEL
Anion gap: 9 (ref 5–15)
BUN: 7 mg/dL (ref 6–20)
CO2: 24 mmol/L (ref 22–32)
Calcium: 9 mg/dL (ref 8.9–10.3)
Chloride: 104 mmol/L (ref 98–111)
Creatinine, Ser: 0.74 mg/dL (ref 0.44–1.00)
GFR, Estimated: >60 mL/min (ref >60)
Glucose, Bld: 84 mg/dL (ref 70–99)
Potassium: 3.4 mmol/L — ABNORMAL LOW (ref 3.5–5.1)
Sodium: 137 mmol/L (ref 135–145)

## 2020-11-28 LAB — HEMOGLOBIN AND HEMATOCRIT, BLOOD
HCT: 29.3 % — ABNORMAL LOW (ref 36.0–46.0)
Hemoglobin: 7.8 g/dL — ABNORMAL LOW (ref 12.0–15.0)

## 2020-11-28 MED ORDER — ATORVASTATIN CALCIUM 40 MG PO TABS: 40.0000 mg | ORAL_TABLET | Freq: Every day | ORAL | 0 refills | Status: DC

## 2020-11-28 MED ORDER — FERROUS SULFATE 325 (65 FE) MG PO TABS: 325.0000 mg | ORAL_TABLET | Freq: Every day | ORAL | 0 refills | Status: DC

## 2020-11-28 MED ORDER — ASPIRIN 81 MG PO TBEC: 81.0000 mg | DELAYED_RELEASE_TABLET | Freq: Every day | ORAL | 11 refills | Status: DC

## 2020-11-28 NOTE — Progress Notes (Signed)
Discharged to home accompanied by daughter. D/C instructions given to pt.

## 2020-11-28 NOTE — Discharge Instructions (Signed)
Dear Wendy Ross,   Thank you for letting us participate in your care! In this section, you will find a brief summary of why you were admitted to the hospital, what happened during your admission, your diagnosis/diagnoses, and recommended follow up.   You were admitted because you were experiencing an unusual sensation on your right side and had passed out.   Your testing revealed low blood levels and a stroke.  You were diagnosed with "iron deficiency anemia" and a stroke.  You were treated with blood transfusions, aspirin, and atorvastatin.   You were also seen by the neurology team.    POST-HOSPITAL & CARE INSTRUCTIONS 1. Please follow up with an OBGYN for treatment of your heavy menstrual cycles 2. Please follow-up with the neurologist in 6 weeks 3. We recommend you establish with a primary care provider and have your blood counts re-checked in 1 week. 4. If you would like to establish care at the [facility], please call [phone]. 5. Please see medications section of this packet for any medication changes.    Thank you for choosing [facility]! Take care and be well!  [facility]  [place], [place] [address] [phone]

## 2020-11-28 NOTE — Progress Notes (Signed)
Family Medicine Teaching Service Daily Progress Note Intern Pager: [phone]  Patient name: Wendy Ross Medical record number: [id number] Date of birth: [birth year] Age: 45 y.o. Gender: female  Primary Care Provider: Patient, No Pcp Per Consultants: Neuro Code Status: FULL  Pt Overview and Major Events to Date:  1/19: admitted, transfused 2u pRBCs 11/26/2020: episode of chest pain which resolved with nitro 11/26/2020: received Feraheme infusion  Assessment and Plan:  Wendy Ross is a 45 y.o. female who presented with syncope and was admitted for symptomatic anemia and acute CVA. PMHx is significant for anemia, menorrhagia, and HLD.  Acute CVA, improved MRI on admission showed small acute infarcts within the left MCA vascular territory and watershed territories. Likely due to a combination of severe anemia, COVID, and cocaine use. Transthoracic echo was unremarkable, transcranial doppler studies normal, carotid US w/o significant stenosis, transcranial bubble study is pending. She has no focal deficits on neuro exam. Stroke workup unremarkable thus far other than UDS +for cocaine. -Neuro following, appreciate recommendations - Transcranial bubble study to be performed 1/22, hopefully d/c after -ASA 81mg  daily, last dose of Clopidogrel 01/21 per neuro recs -Atorvastatin 40mg  daily -f/u transcranial bubble study 11/28/2020 after which patient can discharge  Iron Deficiency Anemia, stable Hgb stable at 7.6 x 2 days. Iron panel on admission revealed significant iron deficiency anemia. Received Feraheme infusion 11/27/2020. -Continue PO ferrous sulfate 325mg  daily -Rechecking H&H 11/28/2020 -Outpatient OBGYN for treatment of her abnormal uterine bleeding  Tobacco Use  Cocaine Use Smokes 1PPD. UDS positive for cocaine. -Nicotine patch -Tobacco and cocaine cessation counseling -Patient desires nicotine patch on discharge if possible  Chest Pain, resolved Patient had episode of  chest pain 01/20 which started in her head and radiated down to her left chest, resolved ~5 mins after receiving nitro. Vitals remained stable, EKG showed normal sinus rhythm without abnormalities, CXR normal, high sensitivity normal at troponin 4. -Do not suspect cardiac etiology -Continue to monitor  Recent COVID, resolved, out of infectious window Tested positive on 1/10. Completed quarantine period per [facility] guidelines and hospital policy. -d/c precautions as of 01/21 -encourage completion of vaccination on discharge  FEN/GI: Regular diet PPx: SCDs  Status is: Inpatient  Dispo: The patient is from: Home              Anticipated d/c is to: Home              Anticipated d/c date is: 11/28/2020 pending completion of transcranial bubble study             Subjective:  Patient seen resting comfortably in bed.  Per RN patient had a very peaceful night, no concerns or new episodes of chest pain.  Objective: Temp:  [97.8 F (36.6 C)-98 F (36.7 C)] 98 F (36.7 C) (01/21 1900) Pulse Rate:  [64-65] 64 (01/21 1720) Resp:  [14-16] 16 (01/21 1720) BP: (105-118)/(70-77) 105/73 (01/21 1900) SpO2:  [100 %] 100 % (01/21 1720) Physical Exam: General: resting comfortably in bed, NAD Cardiovascular: RRR, normal S1/S2 without m/r/g Respiratory: normal WOB on room air, lungs CTAB  Laboratory: Recent Labs  Lab 11/25/20 1241 11/26/20 0026 11/26/20 1126 11/27/20 0235  WBC 5.1 5.4  --  5.9  HGB 5.6* 6.3* 7.6* 7.6*  HCT 23.0* 23.4* 28.2* 28.2*  PLT 660* 576*  --  529*   Recent Labs  Lab 11/25/20 1241  NA 139  K 3.3*  CL 108  CO2 23  BUN <5*  CREATININE 0.69  CALCIUM 8.7*  PROT 6.5  BILITOT 0.4  ALKPHOS 33*  ALT 13  AST 17  GLUCOSE 94   UDS: positive for cocaine  Imaging/Diagnostic Tests: MR BRAIN WO CONTRAST Result Date: 11/25/2020  Examination limited by artifact as described. Numerous small acute cortical/subcortical infarcts within the left frontal lobe, left parietal  lobe and left temporoparietal junction (left MCA vascular territory as well as left MCA/ACA and MCA/PCA watershed territories). Mild-to-moderate mucosal thickening within the bilateral ethmoid and maxillary sinuses.   VAS US CAROTID Result Date: 11/27/2020 Summary: Right Carotid: Velocities in the right ICA are consistent with a 1-39% stenosis. Left Carotid: Velocities in the left ICA are consistent with a 1-39% stenosis. Vertebrals: Bilateral vertebral arteries demonstrate antegrade flow.     VAS [place] LOWER EXTREMITY VENOUS (DVT) Result Date: 11/27/2020 Summary: RIGHT: - There is no evidence of deep vein thrombosis in the lower extremity.  - No cystic structure found in the popliteal fossa.  LEFT: - There is no evidence of deep vein thrombosis in the lower extremity.  - No cystic structure found in the popliteal fossa.    VAS [place] TRANSCRANIAL DOPPLER Result Date: 11/27/2020 Summary: This was a normal transcranial Doppler study, with normal flow direction and velocity of all identified vessels of the anterior and posterior circulations, with no evidence of stenosis, vasospasm or occlusion. There was no evidence of intracranial disease.   Daisy Floro, DO 11/28/2020, 4:18 AM PGY-3, [place] Intern pager: [phone], text pages welcome

## 2020-11-28 NOTE — Progress Notes (Signed)
STROKE TEAM PROGRESS NOTE   INTERVAL HISTORY Patient is sitting up in bed.  She has no complaints.  She is doing well.  She is very happy that her COVID restrictions have been removed.  Vital signs are stable.  Neurological exam is unchanged.  Transcranial Doppler bubble study performed personally at the bedside is positive for small to medium size right-to-left shunt.  Lower extremity venous Dopplers were negative for DVT.    Vitals:   11/27/20 1720 11/27/20 1900 11/28/20 0500 11/28/20 0810  BP: 118/77 105/73 104/76 108/73  Pulse: 64  69 66  Resp: 16   16  Temp: 97.9 F (36.6 C) 98 F (36.7 C) 98 F (36.7 C) 97.7 F (36.5 C)  TempSrc: Oral Oral Oral Oral  SpO2: 100%   100%  Weight:      Height:       Covid-19 Nucleic Acid Test Results Lab Results  Component Value Date   SARSCOV2 POSITIVE (A) 11/16/2020   CBC:  Recent Labs  Lab 11/25/20 1241 11/26/20 0026 11/26/20 1126 11/27/20 0235 11/28/20 0640  WBC 5.1 5.4  --  5.9  --   NEUTROABS 3.4  --   --   --   --   HGB 5.6* 6.3*   < > 7.6* 7.8*  HCT 23.0* 23.4*   < > 28.2* 29.3*  MCV 70.6* 71.3*  --  72.7*  --   PLT 660* 576*  --  529*  --    < > = values in this interval not displayed.   Basic Metabolic Panel:  Recent Labs  Lab 11/25/20 1241 11/28/20 0640  NA 139 137  K 3.3* 3.4*  CL 108 104  CO2 23 24  GLUCOSE 94 84  BUN <5* 7  CREATININE 0.69 0.74  CALCIUM 8.7* 9.0   Lipid Panel:  Recent Labs  Lab 11/26/20 0653  CHOL 139  TRIG 54  HDL 33*  CHOLHDL 4.2  VLDL 11  LDLCALC 95   HgbA1c:  Recent Labs  Lab 11/26/20 0653  HGBA1C 5.3   Urine Drug Screen: PENDING  Alcohol Level No results for input(s): ETH in the last 168 hours.  IMAGING past 24 hours  VAS US CAROTID  Result Date: 11/27/2020 Carotid Arterial Duplex Study Indications:       CVA. Risk Factors:      None. Comparison Study:  No prior studies. Performing Technologist: Oliver Hum RVT  Examination Guidelines: A complete evaluation  includes B-mode imaging, spectral Doppler, color Doppler, and power Doppler as needed of all accessible portions of each vessel. Bilateral testing is considered an integral part of a complete examination. Limited examinations for reoccurring indications may be performed as noted.  Right Carotid Findings: +----------+--------+--------+--------+-----------------------+--------+           PSV cm/sEDV cm/sStenosisPlaque Description     Comments +----------+--------+--------+--------+-----------------------+--------+ CCA Prox  93      24              smooth and heterogenous         +----------+--------+--------+--------+-----------------------+--------+ CCA Distal72      27              smooth and heterogenous         +----------+--------+--------+--------+-----------------------+--------+ ICA Prox  94      27                                              +----------+--------+--------+--------+-----------------------+--------+  ICA Distal90      33                                     tortuous +----------+--------+--------+--------+-----------------------+--------+ ECA       72      15                                              +----------+--------+--------+--------+-----------------------+--------+ +----------+--------+-------+--------+-------------------+           PSV cm/sEDV cmsDescribeArm Pressure (mmHG) +----------+--------+-------+--------+-------------------+ Subclavian118                                        +----------+--------+-------+--------+-------------------+ +---------+--------+--+--------+--+---------+ VertebralPSV cm/s51EDV cm/s22Antegrade +---------+--------+--+--------+--+---------+  Left Carotid Findings: +----------+--------+--------+--------+-----------------------+--------+           PSV cm/sEDV cm/sStenosisPlaque Description     Comments +----------+--------+--------+--------+-----------------------+--------+ CCA Prox  119      32              smooth and heterogenous         +----------+--------+--------+--------+-----------------------+--------+ CCA Distal86      30              smooth and heterogenous         +----------+--------+--------+--------+-----------------------+--------+ ICA Prox  68      32              smooth and heterogenous         +----------+--------+--------+--------+-----------------------+--------+ ICA Distal76      36                                     tortuous +----------+--------+--------+--------+-----------------------+--------+ ECA       77      15                                              +----------+--------+--------+--------+-----------------------+--------+ +----------+--------+--------+--------+-------------------+           PSV cm/sEDV cm/sDescribeArm Pressure (mmHG) +----------+--------+--------+--------+-------------------+ [id number]                                         +----------+--------+--------+--------+-------------------+ +---------+--------+--+--------+--+---------+ VertebralPSV cm/s69EDV cm/s27Antegrade +---------+--------+--+--------+--+---------+   Summary: Right Carotid: Velocities in the right ICA are consistent with a 1-39% stenosis. Left Carotid: Velocities in the left ICA are consistent with a 1-39% stenosis. Vertebrals: Bilateral vertebral arteries demonstrate antegrade flow. *See table(s) above for measurements and observations.  Electronically signed by Antony Contras MD on 11/27/2020 at 1:03:02 PM.    Final    VAS [place] LOWER EXTREMITY VENOUS (DVT)  Result Date: 11/27/2020  Lower Venous DVT Study Indications: Stroke.  Risk Factors: None identified. Comparison Study: No prior studies. Performing Technologist: Oliver Hum RVT  Examination Guidelines: A complete evaluation includes B-mode imaging, spectral Doppler, color Doppler, and power Doppler as needed of all accessible portions of each vessel. Bilateral testing is  considered an integral part of a complete examination. Limited examinations  for reoccurring indications may be performed as noted. The reflux portion of the exam is performed with the patient in reverse Trendelenburg.  +---------+---------------+---------+-----------+----------+--------------+ RIGHT    CompressibilityPhasicitySpontaneityPropertiesThrombus Aging +---------+---------------+---------+-----------+----------+--------------+ CFV      Full           Yes      Yes                                 +---------+---------------+---------+-----------+----------+--------------+ SFJ      Full                                                        +---------+---------------+---------+-----------+----------+--------------+ FV Prox  Full                                                        +---------+---------------+---------+-----------+----------+--------------+ FV Mid   Full                                                        +---------+---------------+---------+-----------+----------+--------------+ FV DistalFull                                                        +---------+---------------+---------+-----------+----------+--------------+ PFV      Full                                                        +---------+---------------+---------+-----------+----------+--------------+ POP      Full           Yes      Yes                                 +---------+---------------+---------+-----------+----------+--------------+ PTV      Full                                                        +---------+---------------+---------+-----------+----------+--------------+ PERO     Full                                                        +---------+---------------+---------+-----------+----------+--------------+   +---------+---------------+---------+-----------+----------+--------------+ LEFT      CompressibilityPhasicitySpontaneityPropertiesThrombus Aging +---------+---------------+---------+-----------+----------+--------------+ CFV      Full  Yes      Yes                                 +---------+---------------+---------+-----------+----------+--------------+ SFJ      Full                                                        +---------+---------------+---------+-----------+----------+--------------+ FV Prox  Full                                                        +---------+---------------+---------+-----------+----------+--------------+ FV Mid   Full                                                        +---------+---------------+---------+-----------+----------+--------------+ FV DistalFull                                                        +---------+---------------+---------+-----------+----------+--------------+ PFV      Full                                                        +---------+---------------+---------+-----------+----------+--------------+ POP      Full           Yes      Yes                                 +---------+---------------+---------+-----------+----------+--------------+ PTV      Full                                                        +---------+---------------+---------+-----------+----------+--------------+ PERO     Full                                                        +---------+---------------+---------+-----------+----------+--------------+     Summary: RIGHT: - There is no evidence of deep vein thrombosis in the lower extremity.  - No cystic structure found in the popliteal fossa.  LEFT: - There is no evidence of deep vein thrombosis in the lower extremity.  - No cystic structure found in the popliteal fossa.  *See table(s) above for measurements and observations. Electronically signed by Deitra Mayo MD on 11/27/2020 at 3:09:06 PM.  Final    VAS [place]  TRANSCRANIAL DOPPLER  Result Date: 11/27/2020  Transcranial Doppler Indications: Stroke. Comparison Study: No prior studies. Performing Technologist: Oliver Hum RVT  Examination Guidelines: A complete evaluation includes B-mode imaging, spectral Doppler, color Doppler, and power Doppler as needed of all accessible portions of each vessel. Bilateral testing is considered an integral part of a complete examination. Limited examinations for reoccurring indications may be performed as noted.  +----------+-------------+----------+-----------+-------+ RIGHT TCD Right VM (cm)Depth (cm)PulsatilityComment +----------+-------------+----------+-----------+-------+ MCA           66.00                  0.7            +----------+-------------+----------+-----------+-------+ ACA          -47.00                 0.74            +----------+-------------+----------+-----------+-------+ Term ICA      51.00                 0.75            +----------+-------------+----------+-----------+-------+ PCA           42.00                 0.78            +----------+-------------+----------+-----------+-------+ Opthalmic     11.00                 0.97            +----------+-------------+----------+-----------+-------+ ICA siphon    60.00                 0.76            +----------+-------------+----------+-----------+-------+ Vertebral    -60.00                 0.77            +----------+-------------+----------+-----------+-------+ Distal ICA    28.00                 0.88            +----------+-------------+----------+-----------+-------+  +----------+------------+----------+-----------+-------+ LEFT TCD  Left VM (cm)Depth (cm)PulsatilityComment +----------+------------+----------+-----------+-------+ MCA          63.00                 0.74            +----------+------------+----------+-----------+-------+ ACA          -46.00                0.85             +----------+------------+----------+-----------+-------+ Term ICA     45.00                 0.85            +----------+------------+----------+-----------+-------+ PCA          44.00                 0.78            +----------+------------+----------+-----------+-------+ Opthalmic    13.00                 1.48            +----------+------------+----------+-----------+-------+ ICA siphon   15.00  1.02            +----------+------------+----------+-----------+-------+ Vertebral    -40.00                0.66            +----------+------------+----------+-----------+-------+ Distal ICA   39.00                 1.17            +----------+------------+----------+-----------+-------+  +------------+-------+-------+             VM cm/sComment +------------+-------+-------+ Prox Basilar-56.00  0.73   +------------+-------+-------+ +----------------------+----+ Right Lindegaard Ratio2.36 +----------------------+----+ +---------------------+----+ Left Lindegaard Ratio1.62 +---------------------+----+  Summary: This was a normal transcranial Doppler study, with normal flow direction and velocity of all identified vessels of the anterior and posterior circulations, with no evidence of stenosis, vasospasm or occlusion. There was no evidence of intracranial disease. *See table(s) above for TCD measurements and observations.  Diagnosing physician: Antony Contras MD Electronically signed by Antony Contras MD on 11/27/2020 at 1:03:25 PM.    Final    Transcranial Doppler with Bubbles -positive transplant Doppler bubble study with grade 3 Spencer at rest and grade 4 with Valsalva indicating of small to medium size shunt PHYSICAL EXAM Constitutional: Appears well-developed and well-nourished middle-aged African-American lady not in distress.  Psych: Affect appropriate to situation Eyes: No scleral injection HENT: No OP obstruction. Head: Normocephalic.   Cardiovascular: Normal rate and regular rhythm.  Respiratory: Effort normal, symmetric excursions bilaterally, no audible wheezing. GI: Soft.  No distension. There is no tenderness.  Skin: WDI  Neuro: Mental Status: Patient is awake, alert, oriented to person, place, month, year, and situation. Patient is able to give a clear and coherent history. No signs of aphasia or neglect. Cranial Nerves: II: Visual Fields are full. Pupils are equal, round, and reactive to light. III,IV, VI: EOMI without ptosis or diploplia.  V: Facial sensation is symmetric to temperature VII: Facial movement is symmetric.  VIII: hearing is intact to voice X: Uvula elevates symmetrically XI: Shoulder shrug is symmetric. XII: tongue is midline without atrophy or fasciculations.  Motor: Tone is normal. Bulk is normal. 5/5 strength was present in all four extremities.  Diminished fine finger movements on the right.  Orbits left over right upper extremity.  Mild right grip weakness.  Mild right hip flexor and ankle dorsiflexor weakness. Sensory: Sensation is symmetric to light touch and temperature in the arms and legs. Deep Tendon Reflexes: 2+ and symmetric in the biceps and patellae. Plantars: Toes are downgoing bilaterally. Cerebellar: FNF and HKS are intact bilaterally.  ASSESSMENT/PLAN Wendy Ross is a 45 y.o. female with Asthma and HLD who presented with lightheadedness and unsteady gait with right arm and leg weakness with ground level fall. She may have lost consciousness but duration is not clear. While she was down, there was no loss of awareness and noticed she had right sided numbness/weakness. During the event she felt as though her right arm and leg were shaking but bystanders did not witness any movement. No tongue biting or post ictal state.  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    MRI Brain: numerous small acute infarcts in the left MCA as well as left MCA/ACA  and MCA/PCA watershed territories  MRA: Significantly motion degraded study. No evidence of high-grade stenosis, proximal occlusion, or intracranial aneurysm.  Carotid Doppler  Summary:  Right Carotid: Velocities in the right ICA are consistent with a 1-39%  stenosis. Left Carotid: Velocities in the left ICA are consistent with a 1-39%  stenosis. Vertebrals: Bilateral vertebral arteries demonstrate antegrade flow.   2D Echo: EF 65-70% with negative bubble study  Bilat LE Doppler: Negative (preliminary read)    Transcranial Doppler: Negative   Transcranial Doppler with Bubbles - pending  LDL 95  HgbA1c 5.3  VTE prophylaxis is recommended if primary team deems stable from a bleeding/anemia standpoint.     Diet   Diet regular Room service appropriate? Yes; Fluid consistency: Thin   Aspirin 81mg  daily if primary team deems safe in setting of anemia/bleeding concerns.  Therapy recommendations:  Cleared for dc home by PT and OT  Disposition:  Home   Post Stroke Blood Pressure Management . Permissive hypertension (OK if < 220/120) but gradually normalize in 5-7 days . Long-term BP goal normotensive  Hyperlipidemia  Home meds: None  LDL 95, goal < 70  Atorvastatin 40 mg daily   Continue statin at discharge  Anemia/Iron Deficiency  S/p transfusion of 2 units PRBC for hgb 5.6 ->6.3, post transfusion hgb -> 7.6->7.8  Outpatient OBGYN workup for menorrhagia  Feraheme infusion planned today   Management per primary team    COVID   No active symptoms, Management per primary team   Other Stroke Risk Factors  Cigarette smoker, currently smoking one ppd. Advised to stop smoking. Nicotine patch  in use.   Substance abuse - UDS + for Cocaine  Other Active Problems, Findings, Recommendations and/or Plan  Hypokalemia - potassium - 3.3->3.4  Possible discharge home today   Transcranial Doppler with Bubbles - pending  Hospital day # 2 I had a long discussion with patient with regards to the results of her transcranial Doppler bubble study and presence of right-to-left shunt.  It is unclear if this is an incidental finding or a definite contributing factor to her cryptogenic stroke.  I recommend outpatient follow-up with me in the stroke clinic in 6 weeks to discuss her endovascular PFO closure options as well as getting a TEE after she is recovered from her COVID 19 infection.  Continue aspirin 81 mg daily along due to anemia and bleeding and maintain aggressive risk factor modification.  Greater than 50% time during this 25-minute visit was spent in counseling and coordination of care about her cryptogenic stroke and PFO and COVID infection some questions. Antony Contras, MD To contact Stroke Continuity provider, please refer to [email or web address] After hours, contact General Neurology

## 2020-11-28 NOTE — Progress Notes (Signed)
Spoke with Dr. Leonie Man with neurology. Her transcranial Doppler bubble study did reveal presence of a right to left shunt. He recommended follow-up with himself in 6 weeks to discuss PFO closure. He recommended discharging on aspirin 81 mg alone due to anemia and bleeding risk and okay to discharge today.

## 2020-11-28 NOTE — Progress Notes (Signed)
TCD w/ bubble study completed.   Please see CV Proc for preliminary results.   Darlin Coco, RDMS

## 2020-11-29 NOTE — Discharge Summary (Addendum)
[facility] Discharge Summary  Patient name: Wendy Ross Medical record number: [id number] Date of birth: February 15, 1976 Age: 45 y.o. Gender: female Date of Admission: 11/25/2020  Date of Discharge: 11/28/2020 Admitting Physician: Kinnie Feil, MD  Primary Care Provider: Patient, No Pcp Per Consultants: Neurology  Indication for Hospitalization: Symptomatic Anemia, Acute CVA  Discharge Diagnoses/Problem List:  Iron deficiency anemia Acute CVA Menorrhagia Tobacco use Cocaine use COVID-19 (tested positive 11/16/2020)  Disposition: Home  Discharge Condition: Stable, improved  Discharge Exam:  Patient examined by Dr. Milus Banister on morning of discharge.  Per her documentation: General: resting comfortably in bed, NAD Cardiovascular: RRR, normal S1/S2 without m/r/g Respiratory: normal WOB on room air, lungs CTAB  Brief Hospital Course:   Wendy Ross a 45 y.o.femalewho presented with syncope and was admitted for symptomatic anemia and acute CVA. PMHxis significant for anemia and menorrhagia.  Iron Deficiency Anemia Patient presented s/p syncopal episode while at work. Her hemoglobin on admission was found to be 5.6. She was transfused 2u pRBCs and her hemoglobin subsequently remained stable at 7.6 prior to discharge. Iron panel revealed significant iron deficiency anemia with ferritin of 1, iron 19, and saturation ratio of 4.  She was treated with PO ferrous sulfate and Feraheme x1.  Etiology of her anemia is secondary to menorrhagia.  Acute CVA Patient presented with strange feeling in R arm and leg. MRI demonstrated acute infarcts in the left MCA vascular territory as well as left MCA/ACA and MCA/PCA watershed territories.  Etiology possibly secondary to profound anemia, but full stroke work-up was pursued (particularly given recent COVID infection). Stroke workup showed UDS positive for cocaine and ultimately revealed right to left shunt  on transcranial doppler bubble study. Neurology followed the patient throughout her admission. Patient did not have any neurodeficits on exam. She was started on aspirin and statin prior to discharge (no plavix due to anemia/menorrhagia).  Issues for Follow Up:  1. Right to left shunt seen on transcranial Doppler bubble study.  Needs neurology follow-up in 6 weeks  2. Recommend OB/GYN follow-up for treatment of her menorrhagia 3. Recommend recheck hemoglobin in about 1 week  Significant Procedures: None  Significant Labs and Imaging:  Recent Labs  Lab 11/25/20 1241 11/26/20 0026 11/26/20 1126 11/27/20 0235 11/28/20 0640  WBC 5.1 5.4  --  5.9  --   HGB 5.6* 6.3* 7.6* 7.6* 7.8*  HCT 23.0* 23.4* 28.2* 28.2* 29.3*  PLT 660* 576*  --  529*  --    Recent Labs  Lab 11/25/20 1241 11/28/20 0640  NA 139 137  K 3.3* 3.4*  CL 108 104  CO2 23 24  GLUCOSE 94 84  BUN <5* 7  CREATININE 0.69 0.74  CALCIUM 8.7* 9.0  ALKPHOS 33*  --   AST 17  --   ALT 13  --   ALBUMIN 3.5  --    CT HEAD WO CONTRAST Result Date: 11/25/2020 IMPRESSION: 1. No acute intracranial abnormality.   MR BRAIN WO CONTRAST Result Date: 11/25/2020 IMPRESSION: Examination limited by artifact as described. Numerous small acute cortical/subcortical infarcts within the left frontal lobe, left parietal lobe and left temporoparietal junction (left MCA vascular territory as well as left MCA/ACA and MCA/PCA watershed territories). Mild-to-moderate mucosal thickening within the bilateral ethmoid and maxillary sinuses.   MR ANGIO HEAD WO CONTRAST Result Date: 11/26/2020 IMPRESSION: Significantly motion degraded study. No evidence of high-grade stenosis, proximal occlusion, or intracranial aneurysm.   ECHOCARDIOGRAM COMPLETE BUBBLE STUDY Result  Date: 11/26/2020 IMPRESSIONS  1. Left ventricular ejection fraction, by estimation, is 65 to 70%. The left ventricle has normal function. The left ventricle has no regional wall motion  abnormalities. Left ventricular diastolic parameters were normal.  2. Right ventricular systolic function is normal. The right ventricular size is normal.  3. Left atrial size was moderately dilated.  4. Right atrial size was mildly dilated.  5. The mitral valve is normal in structure. Trivial mitral valve regurgitation.  6. The aortic valve is tricuspid. Aortic valve regurgitation is not visualized. No aortic stenosis is present.  7. The inferior vena cava is dilated in size with <50% respiratory variability, suggesting right atrial pressure of 15 mmHg.  8. Agitated saline contrast bubble study was negative, with no evidence of any interatrial shunt.   VAS US CAROTID Result Date: 11/27/2020 Summary: Right Carotid: Velocities in the right ICA are consistent with a 1-39% stenosis. Left Carotid: Velocities in the left ICA are consistent with a 1-39% stenosis. Vertebrals: Bilateral vertebral arteries demonstrate antegrade flow.   VAS [place] LOWER EXTREMITY VENOUS (DVT) Result Date: 11/27/2020 Summary: RIGHT: - There is no evidence of deep vein thrombosis in the lower extremity.  - No cystic structure found in the popliteal fossa.  LEFT: - There is no evidence of deep vein thrombosis in the lower extremity.  - No cystic structure found in the popliteal fossa.   DG CHEST PORT 1 VIEW Result Date: 11/27/2020 IMPRESSION: No acute cardiopulmonary abnormality.    VAS [place] TRANSCRANIAL DOPPLER W BUBBLES Result Date: 11/28/2020 Summary:  A vascular evaluation was performed. The left middle cerebral artery was studied. An IV was inserted into the patient's right forearm. Verbal informed consent was obtained.  <15 High intensity transient signals (HITS) were observed at rest, and 30-50 high intensity transient signals (HITS) were observed with valsalva maneuver indicating a Spencer Grade 3 patent foramen ovale at rest and a Spencer Grade 4 patent foramen ovale  with valsalva. Positive TCD Bubble study indicative of Spencer  3 right to left shunt at rest and Spencer 4 with valsalva manouvre    Results/Tests Pending at Time of Discharge: None  Discharge Medications:  Allergies as of 11/28/2020      Reactions   Percocet [oxycodone-acetaminophen] Nausea Only      Medication List    TAKE these medications   aspirin 81 MG EC tablet Take 1 tablet (81 mg total) by mouth daily. Swallow whole.   atorvastatin 40 MG tablet Commonly known as: LIPITOR Take 1 tablet (40 mg total) by mouth daily.   ferrous sulfate 325 (65 FE) MG tablet Take 1 tablet (325 mg total) by mouth daily.       Discharge Instructions: Please refer to Patient Instructions section of EMR for full details.  Patient was counseled important signs and symptoms that should prompt return to medical care, changes in medications, dietary instructions, activity restrictions, and follow up appointments.   Follow-Up Appointments:  Follow-up [place] Follow up.   Contact information: [place] [address] [phone]              Alcus Dad, MD 11/29/2020, 12:34 PM PGY-1, [facility]  Upper Level Addendum:  I agree with the above documentation of Dr. Rock Nephew.  I have reviewed her note and made appropriate changes if necessary.     Lurline Del, DO 11/29/2020, 1:19 PM PGY-2, [place]

## 2020-12-14 ENCOUNTER — Ambulatory Visit: Payer: Medicaid Other | Admitting: Family

## 2020-12-14 NOTE — Progress Notes (Deleted)
  Subjective:    Wendy Ross - 45 y.o. female MRN [id number]  Date of birth: 26-Oct-1976  HPI  Wendy Ross is to establish care. Patient has a PMH significant for cerebral embolism with cerebral infarction, eczema, abnormal uterine bleeding, dysmenorrhea, symptomatic anemia, cocaine use, and tobacco use disorder.    Current issues and/or concerns:  ROS per HPI     Health Maintenance:  - *** Health Maintenance Due  Topic Date Due  . Hepatitis C Screening  Never done  . INFLUENZA VACCINE  Never done  . PAP SMEAR-Modifier  07/05/2020  . COVID-19 Vaccine (2 - Pfizer 3-dose series) 11/26/2020     Past Medical History: Patient Active Problem List   Diagnosis Date Noted  . Cocaine use   . Tobacco use disorder   . 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 Symptomatic anemia 11/25/2020  . Abnormal uterine bleeding (AUB) 07/05/2017  . Dysmenorrhea 07/05/2017  . Eczema 07/05/2017      Social History   reports that she has been smoking cigarettes. She has been smoking about 0.25 packs per day. She has never used smokeless tobacco. She reports current alcohol use. She reports that she does not use drugs.   Family History  family history includes Hypertension in her mother and another family member.   Medications: reviewed and updated   Objective:   Physical Exam There were no vitals taken for this visit. Physical Exam      Assessment & Plan:       Durene Fruits, NP 12/14/2020, 8:28 AM Primary Care at [facility]

## 2020-12-15 ENCOUNTER — Other Ambulatory Visit: Payer: Self-pay

## 2020-12-15 ENCOUNTER — Telehealth (INDEPENDENT_AMBULATORY_CARE_PROVIDER_SITE_OTHER): Payer: Self-pay | Admitting: Family

## 2020-12-15 ENCOUNTER — Encounter: Payer: Self-pay | Admitting: Family

## 2020-12-15 DIAGNOSIS — Z7689 Persons encountering health services in other specified circumstances: Secondary | ICD-10-CM

## 2020-12-15 DIAGNOSIS — I639 Cerebral infarction, unspecified: Secondary | ICD-10-CM

## 2020-12-15 DIAGNOSIS — N920 Excessive and frequent menstruation with regular cycle: Secondary | ICD-10-CM

## 2020-12-15 DIAGNOSIS — D649 Anemia, unspecified: Secondary | ICD-10-CM

## 2020-12-15 NOTE — Progress Notes (Signed)
Virtual Visit via Telephone Note  I connected with Wendy Ross, on 12/15/2020 at 3:48 PM by telephone due to the COVID-19 pandemic and verified that I am speaking with the correct person using two identifiers.  Due to current restrictions/limitations of in-office visits due to the COVID-19 pandemic, this scheduled clinical appointment was converted to a telehealth visit.   Consent: I discussed the limitations, risks, security and privacy concerns of performing an evaluation and management service by telephone and the availability of in person appointments. I also discussed with the patient that there may be a patient responsible charge related to this service. The patient expressed understanding and agreed to proceed.   Location of Patient: Home  Location of Provider: [facility] [place] participating in Telemedicine visit: [place], NP Elmon Else, CMA  History of Present Illness: Wendy Ross is to establish care. Patient has a PMH significant for cerebral embolism with cerebral infarction, eczema, abnormal uterine bleeding, dysmenorrhea, symptomatic anemia, cocaine use, and tobacco use disorder.   Current issues and/or concerns: 11/25/2020 - 11/28/2020 visit at [facility] per DO note: Wendy Ross a 45 y.o.femalewho presented with syncope and was admitted for symptomatic anemia and acute CVA. PMHxis significant for anemia and menorrhagia.  Iron Deficiency Anemia Patient presented s/p syncopal episode while at work. Her hemoglobin on admission was found to be 5.6. She was transfused 2u pRBCs and her hemoglobin subsequently remained stable at 7.6 prior to discharge. Iron panel revealed significant iron deficiency anemia with ferritin of 1, iron 19, and saturation ratio of 4.  She was treated with PO ferrous sulfate and Feraheme x1. Etiology of her anemia is secondary to menorrhagia.  Acute  CVA Patient presented with strange feeling in R arm and leg. MRI demonstrated acute infarcts in the left MCA vascular territory as well as left MCA/ACA and MCA/PCA watershed territories.  Etiology possibly secondary to profound anemia, but full stroke work-up was pursued (particularly given recent COVID infection). Stroke workup showed UDS positive for cocaine and ultimately revealed right to left shunt on transcranial doppler bubble study. Neurology followed the patient throughout her admission. Patient did not have any neurodeficits on exam. She was started on aspirin and statin prior to discharge (no plavix due to anemia/menorrhagia).  Issues for Follow Up:  1. Right to left shunt seen on transcranial Doppler bubble study.  Needs neurology follow-up in 6 weeks  2. Recommend OB/GYN follow-up for treatment of her menorrhagia 3. Recommend recheck hemoglobin in about 1 week  12/15/2020: Today patient reports since hospital discharge she feels tired most times. 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 Frequent bending makes her dizzy and climbing stairs makes her tired. Since stroke she stutters. Right arm and right leg have returned to normal.   Past Medical History:  Diagnosis Date  . Asthma   . Hypercholesteremia   . Vaginal Pap smear, abnormal    Allergies  Allergen Reactions  . Percocet [Oxycodone-Acetaminophen] Nausea Only    Current Outpatient Medications on File Prior to Visit  Medication Sig Dispense Refill  . aspirin EC 81 MG EC tablet Take 1 tablet (81 mg total) by mouth daily. Swallow whole. 30 tablet 11  . atorvastatin (LIPITOR) 40 MG tablet Take 1 tablet (40 mg total) by mouth daily. 30 tablet 0  . ferrous sulfate 325 (65 FE) MG tablet Take 1 tablet (325 mg total) by mouth daily. 30 tablet  0   No current facility-administered medications on file prior to visit.    Observations/Objective: Alert and oriented x 3. Not in acute distress. Physical examination not  completed as this is a telemedicine visit.  Assessment and Plan: 1. Encounter to establish care: - Patient presents today to establish care.  - Return for annual physical examination, labs, and health maintenance. Arrive fasting meaning having had no food and/or nothing to drink for at least 8 hours prior to appointment.  2. Acute CVA (cerebrovascular accident) [facility]): - Visit at the [facility] 11/25/2020 - 11/28/2020. MRI demonstrated acute infarcts in the left MCA vascular territory as well as left MCA/ACA and MCA/PCA watershed territories.  Etiology possibly secondary to profound anemia, but full stroke work-up was pursued (particularly given recent COVID infection). Stroke workup showed UDS positive for cocaine and ultimately revealed right to left shunt on transcranial doppler bubble study. Neurology followed the patient throughout her admission. Patient did not have any neurodeficits on exam. She was started on aspirin and statin prior to discharge (no plavix due to anemia/menorrhagia). - Today patient reports since hospital discharge she feels tired most times. 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 Frequent bending makes her dizzy and climbing stairs makes her tired. Since stroke she stutters. Right arm and right leg have returned to normal.  - Referral to Neurology for further evaluation and management.  - Ambulatory referral to Neurology  3. Menorrhagia with regular cycle: 4. Anemia, unspecified type: - Visit at the [facility] 11/25/2020 - 11/28/2020. Patient presented s/p syncopal episode while at work. Her hemoglobin on admission was found to be 5.6. She was transfused 2u pRBCs and her hemoglobin subsequently remained stable at 7.6 prior to discharge. Iron panel revealed significant iron deficiency anemia with ferritin of 1, iron 19, and saturation ratio of 4.  She was treated with PO ferrous sulfate and Feraheme x1. Etiology of her anemia is secondary to  menorrhagia. - Referral to Gynecology for further evaluation and management.  - Ambulatory referral to Obstetrics / Gynecology  Follow Up Instructions: Return for physical examination with primary provider.    Patient was given clear instructions to go to Emergency Department or return to medical center if symptoms don't improve, worsen, or new problems develop.The patient verbalized understanding.  I discussed the assessment and treatment plan with the patient. The patient was provided an opportunity to ask questions and all were answered. The patient agreed with the plan and demonstrated an understanding of the instructions.   The patient was advised to call back or seek an in-person evaluation if the symptoms worsen or if the condition fails to improve as anticipated.   I provided 15 minutes total of non-face-to-face time during this encounter including median intraservice time, reviewing previous notes, labs, imaging, medications, management and patient verbalized understanding.    Camillia Herter, NP  [facility] at [place], [place] 12/17/2020, 3:17 PM

## 2020-12-15 NOTE — Progress Notes (Signed)
Establish care Had stroke on 1/19

## 2020-12-26 ENCOUNTER — Other Ambulatory Visit: Payer: Self-pay | Admitting: Family Medicine

## 2021-01-13 ENCOUNTER — Ambulatory Visit: Payer: Medicaid Other | Admitting: Obstetrics and Gynecology

## 2021-01-19 ENCOUNTER — Other Ambulatory Visit: Payer: Self-pay

## 2021-01-19 ENCOUNTER — Encounter (HOSPITAL_COMMUNITY): Payer: Self-pay

## 2021-01-19 ENCOUNTER — Ambulatory Visit (HOSPITAL_COMMUNITY)
Admission: EM | Admit: 2021-01-19 | Discharge: 2021-01-19 | Disposition: A | Discharge status: Home / Self Care | Payer: Self-pay | Attending: Emergency Medicine | Admitting: Emergency Medicine

## 2021-01-19 DIAGNOSIS — Z113 Encounter for screening for infections with a predominantly sexual mode of transmission: Secondary | ICD-10-CM

## 2021-01-19 DIAGNOSIS — L02411 Cutaneous abscess of right axilla: Secondary | ICD-10-CM

## 2021-01-19 MED ORDER — DOXYCYCLINE HYCLATE 100 MG PO CAPS: 100.0000 mg | ORAL_CAPSULE | Freq: Two times a day (BID) | ORAL | 0 refills | Status: DC

## 2021-01-19 MED ORDER — LIDOCAINE-EPINEPHRINE 1 %-1:100000 IJ SOLN
INTRAMUSCULAR | Status: AC
  Filled 2021-01-19: qty 1

## 2021-01-19 NOTE — Discharge Instructions (Addendum)
Take antibiotic twice a day for 5 days, can take with food and full glass of water if it upsets your stomach  Warm compresses at least four times a day  Area does not need to be covered, can cover if wearing clothes to prevent soiling clothes  Lab results 2-3 days, will be notified of any positive result for treatment, please refrain from sex until labs results, if positive please refrain from sex until treatment complete

## 2021-01-19 NOTE — ED Triage Notes (Signed)
Pt c/o boil under the left arm X 1 month. Pt states she has tried warm compress and states there has been no improvement.  Pt states she has been having vaginal discharge.

## 2021-01-19 NOTE — ED Provider Notes (Signed)
[place]    CSN: [id number] Arrival date & time: 01/19/21  1318      History   Chief Complaint Chief Complaint  Patient presents with  . Abscess  . Vaginal Discharge    HPI Wendy Ross is a 45 y.o. female.   Patient presents with boil under left arm for one month. Started small and has increased in size. Has become painful to move and keep arm at rest. Has attempted warm compresses.Started to drain spontaneously last week but closed up. Denies fever, chills. Shaves area.   Presents with yellowish clear discharge present for about 3-4 weeks. Denies itching, odor, frequency, urgency, abdominal pain, flank pain. LMP 01/09/21.  History of bacterial vaginosis.   Past Medical History:  Diagnosis Date  . Asthma   . Hypercholesteremia   . Vaginal Pap smear, abnormal     Patient Active Problem List   Diagnosis Date Noted  . Cocaine use   . Tobacco use disorder   . 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 Symptomatic anemia 11/25/2020  . Abnormal uterine bleeding (AUB) 07/05/2017  . Dysmenorrhea 07/05/2017  . Eczema 07/05/2017    Past Surgical History:  Procedure Laterality Date  . TUBAL LIGATION    . WISDOM TOOTH EXTRACTION      OB History    Gravida  6   Para  4   Term  4   Preterm      AB  2   Living  4     SAB      IAB  2   Ectopic      Multiple      Live Births               Home Medications    Prior to Admission medications   Medication Sig Start Date End Date Taking? Authorizing Provider  doxycycline (VIBRAMYCIN) 100 MG capsule Take 1 capsule (100 mg total) by mouth 2 (two) times daily. 01/19/21  Yes White, Adrienne R, NP  ferrous sulfate 325 (65 FE) MG tablet Take 1 tablet (325 mg total) by mouth daily. 11/28/20  Yes Zola Button, MD  aspirin EC 81 MG EC tablet Take 1 tablet (81 mg total) by mouth daily. Swallow whole. 11/29/20   Zola Button, MD  atorvastatin (LIPITOR) 40 MG tablet Take 1 tablet (40 mg  total) by mouth daily. 11/29/20   Zola Button, MD    Family History Family History  Problem Relation Age of Onset  . Hypertension Mother   . Hypertension Other     Social History Social History   Tobacco Use  . Smoking status: Current Every Day Smoker    Packs/day: 0.25    Types: Cigarettes  . Smokeless tobacco: Never Used  Vaping Use  . Vaping Use: Never used  Substance Use Topics  . Alcohol use: Yes    Comment: social  . Drug use: No     Allergies   Percocet [oxycodone-acetaminophen]   Review of Systems Review of Systems  Constitutional: Negative.   Respiratory: Negative.   Cardiovascular: Negative.   Gastrointestinal: Negative.   Genitourinary: Positive for vaginal discharge. Negative for decreased urine volume, difficulty urinating, dyspareunia, dysuria, enuresis, flank pain, frequency, genital sores, hematuria, menstrual problem, pelvic pain, urgency, vaginal bleeding and vaginal pain.  Musculoskeletal: Negative.   Skin: Positive for wound. Negative for color change, pallor and rash.  Neurological: Negative.      Physical Exam Triage Vital Signs ED Triage Vitals  Enc  Vitals Group     BP 01/19/21 1444 107/75     Pulse Rate 01/19/21 1444 74     Resp 01/19/21 1444 17     Temp 01/19/21 1444 98.8 F (37.1 C)     Temp Source 01/19/21 1444 Oral     SpO2 01/19/21 1444 100 %     Weight --      Height --      Head Circumference --      Peak Flow --      Pain Score 01/19/21 1442 6     Pain Loc --      Pain Edu? --      Excl. in [place]? --    No data found.  Updated Vital Signs BP 107/75 (BP Location: Left Arm)   Pulse 74   Temp 98.8 F (37.1 C) (Oral)   Resp 17   LMP 01/09/2021 (Exact Date)   SpO2 100%   Visual Acuity Right Eye Distance:   Left Eye Distance:   Bilateral Distance:    Right Eye Near:   Left Eye Near:    Bilateral Near:     Physical Exam Constitutional:      Appearance: Normal appearance. She is normal weight.  HENT:      Head: Normocephalic.  Eyes:     Extraocular Movements: Extraocular movements intact.  Pulmonary:     Effort: Pulmonary effort is normal.  Musculoskeletal:        General: Normal range of motion.  Skin:    Comments: Abscess present under left axilla   Neurological:     Mental Status: She is alert and oriented to person, place, and time. Mental status is at baseline.  Psychiatric:        Mood and Affect: Mood normal.        Behavior: Behavior normal.        Thought Content: Thought content normal.        Judgment: Judgment normal.      UC Treatments / Results  Labs (all labs ordered are listed, but only abnormal results are displayed) Labs Reviewed  CERVICOVAGINAL ANCILLARY ONLY    EKG   Radiology No results found.  Procedures Incision and Drainage  Date/Time: 01/19/2021 3:52 PM Performed by: Hans Eden, NP Authorized by: Hans Eden, NP   Consent:    Consent obtained:  Verbal   Consent given by:  Patient   Risks, benefits, and alternatives were discussed: yes     Risks discussed:  Bleeding, infection and pain   Alternatives discussed:  No treatment Universal protocol:    Patient identity confirmed:  Verbally with patient Location:    Type:  Abscess   Size:  2x2   Location: left axilla. Pre-procedure details:    Skin preparation:  Chlorhexidine with alcohol Sedation:    Sedation type:  None Anesthesia:    Anesthesia method:  Local infiltration   Local anesthetic:  Lidocaine 1% WITH epi Procedure type:    Complexity:  Simple Procedure details:    Incision types:  Single straight   Drainage:  Purulent   Drainage amount:  Moderate   Wound treatment:  Wound left open Post-procedure details:    Procedure completion:  Tolerated   (including critical care time)  Medications Ordered in UC Medications - No data to display  Initial Impression / Assessment and Plan / UC Course  I have reviewed the triage vital signs and the nursing  notes.  Pertinent labs & imaging results  that were available during my care of the patient were reviewed by me and considered in my medical decision making (see chart for details).  Abscess of right axilla Routine screening for sti  1. Swab, self collected, pending 2-3 days, will treat per protocol, advised abstinence until labs result and/or treatment complete 2. Doxycyline 100 mg bid for 5 days 3. Warm compresses at least four times a day 4. Follow up for worsening pain, increased swelling, fever chills,  Final Clinical Impressions(s) / UC Diagnoses   Final diagnoses:  Abscess of right axilla  Routine screening for STI (sexually transmitted infection)     Discharge Instructions     Take antibiotic twice a day for 5 days, can take with food and full glass of water if it upsets your stomach  Warm compresses at least four times a day  Area does not need to be covered, can cover if wearing clothes to prevent soiling clothes  Lab results 2-3 days, will be notified of any positive result for treatment, please refrain from sex until labs results, if positive please refrain from sex until treatment complete    ED Prescriptions    Medication Sig Dispense Auth. Provider   doxycycline (VIBRAMYCIN) 100 MG capsule Take 1 capsule (100 mg total) by mouth 2 (two) times daily. 10 capsule Hans Eden, NP     PDMP not reviewed this encounter.   Hans Eden, NP 01/19/21 1555

## 2021-01-21 LAB — CERVICOVAGINAL ANCILLARY ONLY
Bacterial Vaginitis (gardnerella): NEGATIVE
Candida Glabrata: NEGATIVE
Candida Vaginitis: NEGATIVE
Chlamydia: NEGATIVE
Comment: NEGATIVE
Comment: NEGATIVE
Comment: NEGATIVE
Comment: NEGATIVE
Comment: NEGATIVE
Comment: NORMAL
Neisseria Gonorrhea: NEGATIVE
Trichomonas: NEGATIVE

## 2021-01-22 ENCOUNTER — Encounter: Payer: Medicaid Other | Admitting: Family

## 2021-01-22 ENCOUNTER — Encounter: Payer: Self-pay | Admitting: Family

## 2021-01-22 NOTE — Progress Notes (Signed)
Patient did not show for appointment.   

## 2021-01-24 ENCOUNTER — Encounter: Payer: Self-pay | Admitting: Family

## 2021-01-25 NOTE — Telephone Encounter (Signed)
Patient had a visit at [facility] on 01/19/2021 for routine screening of STI, all results negative.   Patient encouraged to keep Gynecology appointment scheduled 02/17/2021 with Aletha Halim, MD and discuss further evaluation and management at that time. Patient may also consider calling their office to see if appointment can be moved to an earlier date.  Counseled to return to Emergency Department or Urgent Care if symptoms worsen or new problems develop.

## 2021-02-17 ENCOUNTER — Ambulatory Visit: Payer: Medicaid Other | Admitting: Obstetrics and Gynecology

## 2021-02-26 ENCOUNTER — Encounter: Payer: Self-pay | Admitting: Obstetrics and Gynecology

## 2021-02-26 ENCOUNTER — Other Ambulatory Visit: Payer: Self-pay

## 2021-02-26 ENCOUNTER — Ambulatory Visit (INDEPENDENT_AMBULATORY_CARE_PROVIDER_SITE_OTHER): Payer: Self-pay | Admitting: Obstetrics and Gynecology

## 2021-02-26 ENCOUNTER — Other Ambulatory Visit (HOSPITAL_COMMUNITY)
Admission: RE | Admit: 2021-02-26 | Discharge: 2021-02-26 | Disposition: A | Discharge status: Home / Self Care | Payer: Medicaid Other | Source: Ambulatory Visit | Attending: Obstetrics and Gynecology | Admitting: Obstetrics and Gynecology

## 2021-02-26 ENCOUNTER — Telehealth: Payer: Self-pay

## 2021-02-26 VITALS — BP 111/78 | HR 81 | Wt 155.3 lb

## 2021-02-26 DIAGNOSIS — N939 Abnormal uterine and vaginal bleeding, unspecified: Secondary | ICD-10-CM | POA: Diagnosis present

## 2021-02-26 DIAGNOSIS — N898 Other specified noninflammatory disorders of vagina: Secondary | ICD-10-CM

## 2021-02-26 DIAGNOSIS — D219 Benign neoplasm of connective and other soft tissue, unspecified: Secondary | ICD-10-CM

## 2021-02-26 DIAGNOSIS — N920 Excessive and frequent menstruation with regular cycle: Secondary | ICD-10-CM

## 2021-02-26 DIAGNOSIS — Z1231 Encounter for screening mammogram for malignant neoplasm of breast: Secondary | ICD-10-CM

## 2021-02-26 LAB — POCT URINALYSIS DIP (DEVICE)
Bilirubin Urine: NEGATIVE
Glucose, UA: NEGATIVE mg/dL
Hgb urine dipstick: NEGATIVE
Ketones, ur: NEGATIVE mg/dL
Leukocytes,Ua: NEGATIVE
Nitrite: NEGATIVE
Protein, ur: NEGATIVE mg/dL
Specific Gravity, Urine: >=1.03 (ref 1.005–1.030)
Urobilinogen, UA: 0.2 mg/dL (ref 0.0–1.0)
pH: 6 (ref 5.0–8.0)

## 2021-02-26 MED ORDER — NORETHINDRONE 0.35 MG PO TABS
1.0000 | ORAL_TABLET | Freq: Every day | ORAL | 3 refills | Status: DC
Start: 2021-02-26 — End: 2021-03-29

## 2021-02-26 NOTE — Progress Notes (Signed)
[facility] advised that they do not accept Family Planning Medicaid & that she would need to go thru [facility].

## 2021-02-26 NOTE — Telephone Encounter (Signed)
Called Pt to advise of U/S @ [facility] on 03/12/21 at 2:30 & to arrive at 2:15p. Pt did not answer & VM is full, Pt has My Chart will rcve notification.

## 2021-02-26 NOTE — Progress Notes (Signed)
Obstetrics and Gynecology New Patient Evaluation  Appointment Date: 02/26/2021  OBGYN Clinic: [facility]   Primary Care Provider: Camillia Herter  Referring Provider: Camillia Herter, NP  Chief Complaint: Menorrhagia, vaginal discharge, urine leakage  History of Present Illness: Wendy Ross is a 45 y.o. African-American [id number] (Patient's last menstrual period was 01/29/2021 (approximate).), seen for the above chief complaint. Her past medical history is significant for h/o BTL, fibroids and recent CVA (?due to covid, cocaine use, anemia).  Patient was admitted 1/19 to 1/22 for anemia and dx with a stroke. Hgb was 5.6, +UDS for cocaine and PFO see on neurology work up. She was transfused and discharged to home.  She was seen by her PCP in mid may and referred to GYN for her anemia and menorrhagia. Increasing worse periods for the past few months; she was last seen by [place] in 2018 for menorrhagia and u/s ordered (not done) but I don't see a follow up visit. U/s then showed multiple fibroids. She had a vaginal swab recently by her PCP and it was negative but she continues to endorse vaginal discharge, ?urine lekage but no lower urinary tract s/s. HIV, rpr negative 05/2020. She denies any breast s/s.   She has not seen Neurology yet because she states she has not heard back from them since discharge  Review of Systems: Pertinent items noted in HPI and remainder of comprehensive ROS otherwise negative.    Patient Active Problem List   Diagnosis Date Noted  . Cocaine use   . Tobacco use disorder   . 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 Symptomatic anemia 11/25/2020  . Abnormal uterine bleeding (AUB) 07/05/2017  . Dysmenorrhea 07/05/2017  . Eczema 07/05/2017    Past Medical History:  Past Medical History:  Diagnosis Date  . Asthma   . COVID-19 11/2020  . Hypercholesteremia   . Vaginal Pap smear, abnormal     Past  Surgical History:  Past Surgical History:  Procedure Laterality Date  . TUBAL LIGATION    . WISDOM TOOTH EXTRACTION      Past Obstetrical History:  OB History  Gravida Para Term Preterm AB Living  6 4 4   2 4   SAB IAB Ectopic Multiple Live Births    2     4    # Outcome Date GA Lbr Len/2nd Weight Sex Delivery Anes PTL Lv  6 Term 2004    M Vag-Spont   LIV  5 Term 2000    F Vag-Spont   LIV  4 IAB 1996          3 Term 1995    F Vag-Spont   LIV  2 IAB 1993          1 Term 1992     Vag-Spont   LIV    Past Gynecological History: As per HPI. Periods: qmonth, regular, 5-7d, heavy. She has used OCPs, depo provera and nexplanon in the past and liked all of them but the nexplanon did migrate History of Pap Smear(s): Yes.   Last pap 2018, which was negative She is currently using bilateral tubal ligation for contraception.   Social History:  Social History   Socioeconomic History  . Marital status: Married    Spouse name: Not on file  . Number of children: Not on file  . Years of education: Not on file  . Highest education level: Not on file  Occupational History  . Not on  file  Tobacco Use  . Smoking status: Current Every Day Smoker    Packs/day: 0.25    Types: Cigarettes  . Smokeless tobacco: Never Used  Vaping Use  . Vaping Use: Never used  Substance and Sexual Activity  . Alcohol use: Yes    Comment: social  . Drug use: No  . Sexual activity: Yes    Birth control/protection: Surgical  Other Topics Concern  . Not on file  Social History Narrative  . Not on file   Social Determinants of Health   Financial Resource Strain: Not on file  Food Insecurity: Not on file  Transportation Needs: Not on file  Physical Activity: Not on file  Stress: Not on file  Social Connections: Not on file  Intimate Partner Violence: Not on file    Family History:  Family History  Problem Relation Age of Onset  . Hypertension Mother   . Hypertension Other   . COPD Father   .  Tuberculosis Father   . Hypertension Sister   . Hypertension Brother     Medications [name] had no medications administered during this visit. Current Outpatient Medications  Medication Sig Dispense Refill  . ferrous sulfate 325 (65 FE) MG tablet Take 1 tablet (325 mg total) by mouth daily. 30 tablet 0  . aspirin EC 81 MG EC tablet Take 1 tablet (81 mg total) by mouth daily. Swallow whole. (Patient not taking: Reported on 02/26/2021) 30 tablet 11  . atorvastatin (LIPITOR) 40 MG tablet Take 1 tablet (40 mg total) by mouth daily. (Patient not taking: Reported on 02/26/2021) 30 tablet 0  . doxycycline (VIBRAMYCIN) 100 MG capsule Take 1 capsule (100 mg total) by mouth 2 (two) times daily. (Patient not taking: Reported on 02/26/2021) 10 capsule 0   No current facility-administered medications for this visit.    Allergies Percocet [oxycodone-acetaminophen]   Physical Exam:  BP 111/78   Pulse 81   Wt 155 lb 4.8 oz (70.4 kg)   LMP 01/29/2021 (Approximate)   BMI 26.66 kg/m  Body mass index is 26.66 kg/m. General appearance: Well nourished, well developed female in no acute distress.  Cardiovascular: normal s1 and s2.  No murmurs, rubs or gallops. Respiratory:  Clear to auscultation bilateral. Normal respiratory effort Abdomen: positive bowel sounds and no masses, hernias; diffusely non tender to palpation, non distended Neuro/Psych:  Normal mood and affect.  Skin:  Warm and dry.  Lymphatic:  No inguinal lymphadenopathy.   Pelvic exam: is not limited by body habitus EGBUS: within normal limits Vagina: within normal limits and with no blood or discharge in the vault Cervix: normal appearing cervix without tenderness, discharge or lesions. Very anterior, mobile. Uterus:  enlarged, c/w 8-10 week size and non tender. Mobile Adnexa:  normal adnexa and no mass, fullness, tenderness Rectovaginal: deferred  Laboratory: no new labs   Radiology: no new imaging Narrative &  Impression  CLINICAL DATA:  Patient with pelvic pain for 4 days.  EXAM: TRANSABDOMINAL AND TRANSVAGINAL ULTRASOUND OF PELVIS  TECHNIQUE: Both transabdominal and transvaginal ultrasound examinations of the pelvis were performed. Transabdominal technique was performed for global imaging of the pelvis including uterus, ovaries, adnexal regions, and pelvic cul-de-sac. It was necessary to proceed with endovaginal exam following the transabdominal exam to visualize the endometrium and adnexal structures.  COMPARISON:  CT 10/04/2009  FINDINGS: Uterus  Measurements: 9.0 x 8.4 x 9.2 cm. There are multiple fibroids demonstrated throughout the uterus with the largest measuring 4.5 x 3.9 x 3.4  cm within the right uterine fundus. There is an additional 3.3 x 2.7 x 2.1 cm fibroid within the mid uterine fundus and an adjacent 2.6 x 1.8 x 2.4 cm fibroid within the left aspect of the uterine fundus.  Endometrium  Thickness: 10 mm.  No focal abnormality visualized.  Right ovary  Measurements: 3.1 x 2.2 x 2.4 cm. Normal appearance/no adnexal mass.  Left ovary  Measurements: 3.0 x 1.6 x 1.9 cm. Normal appearance/no adnexal mass.  Other findings  Small amount of fluid within the right adnexa.  IMPRESSION: Enlarged fibroid uterus.  Normal ovaries.  Small amount of right adnexal free fluid.   Electronically Signed   By: Lovey Newcomer M.D.   On: 06/27/2015 12:07   Assessment: pt stable  Plan:  1. Menorrhagia with regular cycle Importance of staying off drugs discussed due to consideration for some type of surgery if she fails medical management. Mirena IUD would be a good option for her but will get updated pelvic u/s to see if possible, given her h/o fibroids. I d/w her exp management for now or starting progestin only OCPs and she would like to do the latter for now, which was sent in for her. Patient to let us know if she has heavy bleeding on them Consider embx  nv if considering uterine ablation.  - US PELVIC COMPLETE WITH TRANSVAGINAL; Future  2. Fibroids - US PELVIC COMPLETE WITH TRANSVAGINAL; Future  3. Vaginal discharge - Cervicovaginal ancillary only( [place]) - Cytology - PAP( [place]) - Urine Culture  4. Abnormal uterine bleeding (AUB) - Cervicovaginal ancillary only( [place]) - Cytology - PAP( [place])  5. GYN Care Mammogram ordered.   6. Neurology Patient told to call her neurology doctor asap for follow up. I will CC my note to them as well.   RTC 4 weeks for u/s follow up  Durene Romans MD Attending Center for [facility])

## 2021-02-26 NOTE — Progress Notes (Signed)
Patient is here today because of heavy bleeding during periods. Patient stated it all started 6 months ago along with severe cramps. Also, complains about vaginal discharge that started 1 month ago and experiences this everyday. She described it as a watery discharge and feels like she is urinating and always have to wear a panty liner

## 2021-02-27 LAB — URINE CULTURE: Organism ID, Bacteria: NO GROWTH

## 2021-03-01 LAB — CERVICOVAGINAL ANCILLARY ONLY
Bacterial Vaginitis (gardnerella): NEGATIVE
Candida Glabrata: NEGATIVE
Candida Vaginitis: NEGATIVE
Chlamydia: NEGATIVE
Comment: NEGATIVE
Comment: NEGATIVE
Comment: NEGATIVE
Comment: NEGATIVE
Comment: NEGATIVE
Comment: NORMAL
Neisseria Gonorrhea: NEGATIVE
Trichomonas: NEGATIVE

## 2021-03-03 LAB — CYTOLOGY - PAP
Comment: NEGATIVE
Diagnosis: NEGATIVE
High risk HPV: NEGATIVE

## 2021-03-08 ENCOUNTER — Telehealth: Payer: Self-pay | Admitting: Emergency Medicine

## 2021-03-08 NOTE — Telephone Encounter (Signed)
Sent patient mychart message

## 2021-03-12 ENCOUNTER — Ambulatory Visit (HOSPITAL_COMMUNITY)
Admission: RE | Admit: 2021-03-12 | Discharge: 2021-03-12 | Disposition: A | Discharge status: Home / Self Care | Payer: Self-pay | Source: Ambulatory Visit | Attending: Obstetrics and Gynecology | Admitting: Obstetrics and Gynecology

## 2021-03-12 ENCOUNTER — Other Ambulatory Visit: Payer: Self-pay

## 2021-03-12 DIAGNOSIS — N920 Excessive and frequent menstruation with regular cycle: Secondary | ICD-10-CM

## 2021-03-12 DIAGNOSIS — D219 Benign neoplasm of connective and other soft tissue, unspecified: Secondary | ICD-10-CM

## 2021-03-12 DIAGNOSIS — N939 Abnormal uterine and vaginal bleeding, unspecified: Secondary | ICD-10-CM

## 2021-03-19 ENCOUNTER — Encounter: Payer: Self-pay | Admitting: Obstetrics and Gynecology

## 2021-03-19 DIAGNOSIS — D259 Leiomyoma of uterus, unspecified: Secondary | ICD-10-CM | POA: Insufficient documentation

## 2021-03-29 ENCOUNTER — Encounter: Payer: Self-pay | Admitting: Obstetrics and Gynecology

## 2021-03-29 ENCOUNTER — Ambulatory Visit (INDEPENDENT_AMBULATORY_CARE_PROVIDER_SITE_OTHER): Payer: Self-pay | Admitting: Obstetrics and Gynecology

## 2021-03-29 ENCOUNTER — Other Ambulatory Visit: Payer: Self-pay

## 2021-03-29 VITALS — BP 104/77 | HR 86 | Ht 64.0 in | Wt 151.1 lb

## 2021-03-29 DIAGNOSIS — N920 Excessive and frequent menstruation with regular cycle: Secondary | ICD-10-CM | POA: Insufficient documentation

## 2021-03-29 DIAGNOSIS — N946 Dysmenorrhea, unspecified: Secondary | ICD-10-CM

## 2021-03-29 DIAGNOSIS — D259 Leiomyoma of uterus, unspecified: Secondary | ICD-10-CM

## 2021-03-29 MED ORDER — SLYND 4 MG PO TABS: 1.0000 | ORAL_TABLET | Freq: Every day | ORAL | 3 refills | Status: DC

## 2021-03-29 NOTE — Progress Notes (Signed)
Obstetrics and Gynecology Established Patient Evaluation  Appointment Date: 03/29/2021  OBGYN Clinic: [facility] for [facility]   Primary Care Provider: Camillia Herter  Referring Provider: Camillia Herter, NP  Chief Complaint: follow up AUB  Interval History: Last visit has a negative Ucx, negative pap and hpv and negative gc/ct/trich/bv and yeast. She has not heard from the neurology office. She had a heavy period that started a week ago and is almost gone now; it was also painful. She is taking the Camila and continued it when she was on her cycle.   5/6 ultrasound showed three large fibroids with submucosal components for two of them.   History of Present Illness: Wendy Ross is a 45 y.o.  [phone] (Patient's last menstrual period was 03/23/2021 (exact date).), seen for the above chief complaint. Her past medical history is significant for h/o BTL, fibroids and recent CVA (?due to covid, cocaine use, anemia).  Patient was admitted 1/19 to 1/22 for anemia and dx with a stroke. Hgb was 5.6, +UDS for cocaine and PFO see on neurology work up. She was transfused and discharged to home.  She was seen by her PCP in mid may and referred to GYN for her anemia and menorrhagia. Increasing worse periods for the past few months; she was last seen by [place] in 2018 for menorrhagia and u/s ordered (not done) but I don't see a follow up visit. U/s then showed multiple fibroids. She had a vaginal swab recently by her PCP and it was negative but she continues to endorse vaginal discharge, ?urine lekage but no lower urinary tract s/s. HIV, rpr negative 05/2020. She denies any breast s/s.   She has not seen Neurology yet because she states she has not heard back from them since discharge  She was seen by me on 02/26/21 for a new patient visit and plan was to start progestin only pills while awaiting an updated pelvic ultrasound and patient to establish f/u with neurology  Review  of Systems: Pertinent items noted in HPI and remainder of comprehensive ROS otherwise negative.    Patient Active Problem List   Diagnosis Date Noted  . Fibroid uterus 03/19/2021  . Cocaine use   . Tobacco use disorder   . 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 Symptomatic anemia 11/25/2020  . Abnormal uterine bleeding (AUB) 07/05/2017  . Dysmenorrhea 07/05/2017  . Eczema 07/05/2017    Past Medical History:  Past Medical History:  Diagnosis Date  . Asthma   . COVID-19 11/2020  . Hypercholesteremia   . Vaginal Pap smear, abnormal     Past Surgical History:  Past Surgical History:  Procedure Laterality Date  . TUBAL LIGATION    . WISDOM TOOTH EXTRACTION      Past Obstetrical History:  OB History  Gravida Para Term Preterm AB Living  6 4 4   2 4   SAB IAB Ectopic Multiple Live Births    2     4    # Outcome Date GA Lbr Len/2nd Weight Sex Delivery Anes PTL Lv  6 Term 2004    M Vag-Spont   LIV  5 Term 2000    F Vag-Spont   LIV  4 IAB 1996          3 Term 1995    F Vag-Spont   LIV  2 IAB 1993          1 Term 52     Vag-Spont  LIV    Past Gynecological History: As per HPI.  Social History:  Social History   Socioeconomic History  . Marital status: Married    Spouse name: Not on file  . Number of children: Not on file  . Years of education: Not on file  . Highest education level: Not on file  Occupational History  . Not on file  Tobacco Use  . Smoking status: Current Every Day Smoker    Packs/day: 0.25    Types: Cigarettes  . Smokeless tobacco: Never Used  Vaping Use  . Vaping Use: Never used  Substance and Sexual Activity  . Alcohol use: Yes    Comment: social  . Drug use: No  . Sexual activity: Yes    Birth control/protection: Surgical  Other Topics Concern  . Not on file  Social History Narrative  . Not on file   Social Determinants of Health   Financial Resource Strain: Not on file  Food Insecurity: Food Insecurity  Present  . Worried About Charity fundraiser in the Last Year: Often true  . Ran Out of Food in the Last Year: Often true  Transportation Needs: No Transportation Needs  . Lack of Transportation (Medical): No  . Lack of Transportation (Non-Medical): No  Physical Activity: Not on file  Stress: Not on file  Social Connections: Not on file  Intimate Partner Violence: Not on file    Family History:  Family History  Problem Relation Age of Onset  . Hypertension Mother   . Hypertension Other   . COPD Father   . Tuberculosis Father   . Hypertension Sister   . Hypertension Brother    Medications [name] had no medications administered during this visit. Current Outpatient Medications  Medication Sig Dispense Refill  . ferrous sulfate 325 (65 FE) MG tablet Take 1 tablet (325 mg total) by mouth daily. 30 tablet 0  . norethindrone (CAMILA) 0.35 MG tablet Take 1 tablet (0.35 mg total) by mouth daily. 90 tablet 3  . aspirin EC 81 MG EC tablet Take 1 tablet (81 mg total) by mouth daily. Swallow whole. (Patient not taking: Reported on 02/26/2021) 30 tablet 11  . atorvastatin (LIPITOR) 40 MG tablet Take 1 tablet (40 mg total) by mouth daily. (Patient not taking: Reported on 02/26/2021) 30 tablet 0   No current facility-administered medications for this visit.    Allergies Percocet [oxycodone-acetaminophen]   Physical Exam:  BP 104/77   Pulse 86   Ht 5\' 4"  (1.626 m)   Wt 151 lb 1.6 oz (68.5 kg)   LMP 03/23/2021 (Exact Date)   BMI 25.94 kg/m  Body mass index is 25.94 kg/m.  General appearance: Well nourished, well developed female in no acute distress.  Neuro/Psych:  Normal mood and affect.   Laboratory: none  Radiology:  Narrative & Impression  CLINICAL DATA:  Menorrhagia with regular cycles.  EXAM: TRANSABDOMINAL AND TRANSVAGINAL ULTRASOUND OF PELVIS  TECHNIQUE: Both transabdominal and transvaginal ultrasound examinations of the pelvis were performed.  Transabdominal technique was performed for global imaging of the pelvis including uterus, ovaries, adnexal regions, and pelvic cul-de-sac. It was necessary to proceed with endovaginal exam following the transabdominal exam to visualize the uterus, endometrium and ovaries.  COMPARISON:  06/27/15  FINDINGS: Uterus  Measurements: 12.2 x 9.3 x 9.8 cm = volume: 579.9 mL. Three large fibroids are identified.  -Anterior fundal fibroid measures 7 x 5 x 6 cm. This is partially submucosal.  -within the mid body of  uterus there is a partially submucosal and intramural fibroid measuring 7 x 5 x 6 cm.  Lower uterine segment, anterior intramural fibroid measures 4 x 4 x 3 cm.  Endometrium  Thickness: Measures approximately 6 mm but difficult to visualize due to distorted anatomy from fibroids.  Right ovary  Not visualized  Left ovary  Measurements: 3.0 x 2.2 x 4.1 cm = volume: 14.5 mL. Normal appearance/no adnexal mass.  Other findings  Nabothian cysts noted within the cervix.  IMPRESSION: 1. Enlarged fibroid uterus. Three large fibroids are noted to of which appear partially submucosal. 2. Nonvisualization of the right ovary.   Electronically Signed   By: Kerby Moors M.D.   On: 03/15/2021 08:00    Assessment: pt stable  Plan: I d/w her re: the importance of following up for neurology especially surgery is decided upon, neurology sign off would need to be done.  She currently has family planning medicaid which covers medication but no surgery and she is trying to get full medicaid.  I told her that we can try a different type of POP and will switch to [facility] and I told her to skip the placebo pills and to take them continuously to hopefully avoid a period; she confirms she is still taking the po iron. In terms of final management, if the Slynd does not work, then I d/w her surgical options and she is leaning towards definitive management with hysterectomy,  if she can get surgery  coverage  Patient to let us know if anything changes with her GYN coverage.   RTC 2-47m for follow up  Durene Romans MD Attending Center for [facility])

## 2021-05-20 ENCOUNTER — Ambulatory Visit: Payer: Medicaid Other | Admitting: Obstetrics and Gynecology

## 2022-07-11 ENCOUNTER — Observation Stay (HOSPITAL_COMMUNITY)
Admission: EM | Admit: 2022-07-11 | Discharge: 2022-07-12 | Disposition: A | Discharge status: Home / Self Care | Payer: 59 | Attending: Internal Medicine | Admitting: Internal Medicine

## 2022-07-11 ENCOUNTER — Encounter (HOSPITAL_COMMUNITY): Payer: Self-pay

## 2022-07-11 ENCOUNTER — Other Ambulatory Visit: Payer: Self-pay

## 2022-07-11 ENCOUNTER — Emergency Department (HOSPITAL_COMMUNITY): Payer: 59

## 2022-07-11 DIAGNOSIS — F17210 Nicotine dependence, cigarettes, uncomplicated: Secondary | ICD-10-CM | POA: Diagnosis not present

## 2022-07-11 DIAGNOSIS — D62 Acute posthemorrhagic anemia: Secondary | ICD-10-CM

## 2022-07-11 DIAGNOSIS — R69 Illness, unspecified: Secondary | ICD-10-CM | POA: Diagnosis not present

## 2022-07-11 DIAGNOSIS — I700 Atherosclerosis of aorta: Secondary | ICD-10-CM | POA: Diagnosis not present

## 2022-07-11 DIAGNOSIS — E785 Hyperlipidemia, unspecified: Secondary | ICD-10-CM | POA: Diagnosis not present

## 2022-07-11 DIAGNOSIS — D259 Leiomyoma of uterus, unspecified: Secondary | ICD-10-CM | POA: Diagnosis not present

## 2022-07-11 DIAGNOSIS — Z743 Need for continuous supervision: Secondary | ICD-10-CM | POA: Diagnosis not present

## 2022-07-11 DIAGNOSIS — Z7982 Long term (current) use of aspirin: Secondary | ICD-10-CM | POA: Insufficient documentation

## 2022-07-11 DIAGNOSIS — F17200 Nicotine dependence, unspecified, uncomplicated: Secondary | ICD-10-CM

## 2022-07-11 DIAGNOSIS — N852 Hypertrophy of uterus: Secondary | ICD-10-CM | POA: Diagnosis not present

## 2022-07-11 DIAGNOSIS — D649 Anemia, unspecified: Principal | ICD-10-CM

## 2022-07-11 DIAGNOSIS — Z79899 Other long term (current) drug therapy: Secondary | ICD-10-CM | POA: Diagnosis not present

## 2022-07-11 DIAGNOSIS — R109 Unspecified abdominal pain: Secondary | ICD-10-CM | POA: Diagnosis not present

## 2022-07-11 DIAGNOSIS — R100 Acute abdomen: Secondary | ICD-10-CM | POA: Diagnosis not present

## 2022-07-11 HISTORY — DX: Cerebral infarction, unspecified: I63.9

## 2022-07-11 LAB — COMPREHENSIVE METABOLIC PANEL
ALT: 10 U/L (ref 0–44)
AST: 17 U/L (ref 15–41)
Albumin: 3.9 g/dL (ref 3.5–5.0)
Alkaline Phosphatase: 35 U/L — ABNORMAL LOW (ref 38–126)
Anion gap: 8 (ref 5–15)
BUN: 10 mg/dL (ref 6–20)
CO2: 23 mmol/L (ref 22–32)
Calcium: 8.9 mg/dL (ref 8.9–10.3)
Chloride: 109 mmol/L (ref 98–111)
Creatinine, Ser: 0.75 mg/dL (ref 0.44–1.00)
GFR, Estimated: >60 mL/min (ref >60)
Glucose, Bld: 90 mg/dL (ref 70–99)
Potassium: 3.1 mmol/L — ABNORMAL LOW (ref 3.5–5.1)
Sodium: 140 mmol/L (ref 135–145)
Total Bilirubin: 0.6 mg/dL (ref 0.3–1.2)
Total Protein: 7.2 g/dL (ref 6.5–8.1)

## 2022-07-11 LAB — CBC
HCT: 17.3 % — ABNORMAL LOW (ref 36.0–46.0)
Hemoglobin: 4.2 g/dL — CL (ref 12.0–15.0)
MCH: 16.2 pg — ABNORMAL LOW (ref 26.0–34.0)
MCHC: 24.3 g/dL — ABNORMAL LOW (ref 30.0–36.0)
MCV: 66.8 fL — ABNORMAL LOW (ref 80.0–100.0)
Platelets: 650 10*3/uL — ABNORMAL HIGH (ref 150–400)
RBC: 2.59 MIL/uL — ABNORMAL LOW (ref 3.87–5.11)
RDW: 27.6 % — ABNORMAL HIGH (ref 11.5–15.5)
WBC: 5 10*3/uL (ref 4.0–10.5)
nRBC: 0 % (ref 0.0–0.2)

## 2022-07-11 LAB — URINALYSIS, ROUTINE W REFLEX MICROSCOPIC
Bilirubin Urine: NEGATIVE
Glucose, UA: NEGATIVE mg/dL
Hgb urine dipstick: NEGATIVE
Ketones, ur: NEGATIVE mg/dL
Leukocytes,Ua: NEGATIVE
Nitrite: NEGATIVE
Protein, ur: NEGATIVE mg/dL
Specific Gravity, Urine: 1.014 (ref 1.005–1.030)
pH: 6 (ref 5.0–8.0)

## 2022-07-11 LAB — PREPARE RBC (CROSSMATCH)

## 2022-07-11 LAB — LIPASE, BLOOD: Lipase: 28 U/L (ref 11–51)

## 2022-07-11 LAB — I-STAT BETA HCG BLOOD, ED (MC, WL, AP ONLY): I-stat hCG, quantitative: <5 m[IU]/mL (ref <5)

## 2022-07-11 MED ORDER — ONDANSETRON HCL 4 MG/2ML IJ SOLN: 4.0000 mg | Freq: Four times a day (QID) | INTRAMUSCULAR | Status: DC | PRN

## 2022-07-11 MED ORDER — DROSPIRENONE 4 MG PO TABS: 1.0000 | ORAL_TABLET | Freq: Every day | ORAL | Status: DC

## 2022-07-11 MED ORDER — ACETAMINOPHEN 650 MG RE SUPP: 650.0000 mg | Freq: Four times a day (QID) | RECTAL | Status: DC | PRN

## 2022-07-11 MED ORDER — FERROUS SULFATE 325 (65 FE) MG PO TABS
325.0000 mg | ORAL_TABLET | Freq: Every day | ORAL | Status: DC
  Administered 2022-07-11 – 2022-07-12 (×2): 325 mg via ORAL
  Filled 2022-07-11 (×2): qty 1

## 2022-07-11 MED ORDER — POTASSIUM CHLORIDE IN NACL 20-0.9 MEQ/L-% IV SOLN
INTRAVENOUS | Status: DC
  Filled 2022-07-11 (×3): qty 1000

## 2022-07-11 MED ORDER — ONDANSETRON HCL 4 MG PO TABS: 4.0000 mg | ORAL_TABLET | Freq: Four times a day (QID) | ORAL | Status: DC | PRN

## 2022-07-11 MED ORDER — TRAZODONE HCL 50 MG PO TABS: 25.0000 mg | ORAL_TABLET | Freq: Every evening | ORAL | Status: DC | PRN

## 2022-07-11 MED ORDER — MAGNESIUM HYDROXIDE 400 MG/5ML PO SUSP
30.0000 mL | Freq: Every day | ORAL | Status: DC | PRN
Start: 2022-07-11 — End: 2022-07-12

## 2022-07-11 MED ORDER — PROGESTERONE MICRONIZED 100 MG PO CAPS
100.0000 mg | ORAL_CAPSULE | Freq: Every day | ORAL | Status: DC
  Administered 2022-07-11 – 2022-07-12 (×2): 100 mg via ORAL
  Filled 2022-07-11 (×2): qty 1

## 2022-07-11 MED ORDER — IOHEXOL 300 MG/ML  SOLN
100.0000 mL | Freq: Once | INTRAMUSCULAR | Status: AC | PRN
Start: 2022-07-11 — End: 2022-07-11
  Administered 2022-07-11: 100 mL via INTRAVENOUS

## 2022-07-11 MED ORDER — ATORVASTATIN CALCIUM 40 MG PO TABS: 40.0000 mg | ORAL_TABLET | Freq: Every day | ORAL | Status: DC

## 2022-07-11 MED ORDER — POTASSIUM CHLORIDE CRYS ER 20 MEQ PO TBCR
40.0000 meq | EXTENDED_RELEASE_TABLET | Freq: Once | ORAL | Status: AC
  Administered 2022-07-11: 40 meq via ORAL
  Filled 2022-07-11: qty 2

## 2022-07-11 MED ORDER — SODIUM CHLORIDE 0.9 % IV SOLN
10.0000 mL/h | Freq: Once | INTRAVENOUS | Status: AC
  Administered 2022-07-11: 10 mL/h via INTRAVENOUS

## 2022-07-11 MED ORDER — ACETAMINOPHEN 325 MG PO TABS: 650.0000 mg | ORAL_TABLET | Freq: Four times a day (QID) | ORAL | Status: DC | PRN

## 2022-07-11 NOTE — H&P (Signed)
[place]   PATIENT NAME: Wendy Ross    MR#:  [id number]  DATE OF BIRTH:  03-18-1976  DATE OF ADMISSION:  07/11/2022  PRIMARY CARE PHYSICIAN: Camillia Herter, NP   Patient is coming from: Home  REQUESTING/REFERRING PHYSICIAN:  Lacretia Leigh, MD  CHIEF COMPLAINT:   Chief Complaint  Patient presents with   Abdominal Pain    HISTORY OF PRESENT ILLNESS:  Wendy Ross is a 46 y.o. African-American female with medical history significant for asthma, dyslipidemia and CVA, who presented to the emergency room with acute onset of generalized weakness and fatigue with lower abdominal pain without nausea or vomiting or diarrhea.  She denies any fever or chills.  She stated that she could not walk and thought she will have a syncope.  But never lost consciousness.  She denies any chest pain or palpitations.  No dysuria, oliguria or hematuria or flank pain.  She is on her third day of menstrual period and stated that during the first couple of days she had heavy bleeding.  She was supposed to follow with her GYN physician and arrange for hysterectomy for fibroid uterus however she lacked insurance until recently.  No fever or chills.  ED Course: When she came to the ER, vital signs were within normal.  Labs revealed hypokalemia of 3.1 and CMP was otherwise unremarkable.  CT PE showed anemia with hemoglobin of 4.2 and hematocrit 17.3 with platelets of 650.  H&H were 7.8 and 29.3 on 11/28/2018 EKG as reviewed by me : EKG showed normal sinus rhythm with a rate of 70 Imaging: Portable chest x-ray showed no acute cardiopulmonary disease.  The patient was given 40 mill [place] p.o. potassium chloride.  She was typed and crossmatched will be transfused units of packed red blood cells.  She will be admitted to a medical telemetry bed for further evaluation and management. PAST MEDICAL HISTORY:   Past Medical History:  Diagnosis Date   Asthma    COVID-19 11/2020   Hypercholesteremia     Stroke ([place])    Vaginal Pap smear, abnormal     PAST SURGICAL HISTORY:   Past Surgical History:  Procedure Laterality Date   TUBAL LIGATION     WISDOM TOOTH EXTRACTION      SOCIAL HISTORY:   Social History   Tobacco Use   Smoking status: Every Day    Packs/day: 0.25    Types: Cigarettes   Smokeless tobacco: Never  Substance Use Topics   Alcohol use: Yes    Comment: social    FAMILY HISTORY:   Family History  Problem Relation Age of Onset   Hypertension Mother    Hypertension Other    COPD Father    Tuberculosis Father    Hypertension Sister    Hypertension Brother     DRUG ALLERGIES:   Allergies  Allergen Reactions   Latex Itching   Percocet [Oxycodone-Acetaminophen] Itching and Nausea Only    REVIEW OF SYSTEMS:   ROS As per history of present illness. All pertinent systems were reviewed above. Constitutional, HEENT, cardiovascular, respiratory, GI, GU, musculoskeletal, neuro, psychiatric, endocrine, integumentary and hematologic systems were reviewed and are otherwise negative/unremarkable except for positive findings mentioned above in the HPI.   MEDICATIONS AT HOME:   Prior to Admission medications   Medication Sig Start Date End Date Taking? Authorizing Provider  aspirin EC 81 MG EC tablet Take 1 tablet (81 mg total) by mouth daily. Swallow whole. Patient not taking:  Reported on 02/26/2021 11/29/20   Zola Button, MD  atorvastatin (LIPITOR) 40 MG tablet Take 1 tablet (40 mg total) by mouth daily. Patient not taking: Reported on 02/26/2021 11/29/20   Zola Button, MD  doxycycline (VIBRAMYCIN) 100 MG capsule Take 1 capsule (100 mg total) by mouth 2 (two) times daily. Patient not taking: Reported on 02/26/2021 01/19/21   Hans Eden, NP  Drospirenone (SLYND) 4 MG TABS Take 1 tablet by mouth daily. Skip the placebo days and start a new pack instead to avoid a period 03/29/21   Aletha Halim, MD  ferrous sulfate 325 (65 FE) MG tablet Take 1 tablet (325  mg total) by mouth daily. 11/28/20   Zola Button, MD      VITAL SIGNS:  Blood pressure 98/66, pulse 77, temperature 98.7 F (37.1 C), temperature source Oral, resp. rate 18, height '5\' 4"'$  (1.626 m), weight 70.9 kg, last menstrual period 07/11/2022, SpO2 100 %.  PHYSICAL EXAMINATION:  Physical Exam  GENERAL:  46 y.o.-year-old African American female patient lying in the bed with no acute distress.  EYES: Pupils equal, round, reactive to light and accommodation. No scleral icterus.  Positive pallor.  Extraocular muscles intact.  HEENT: Head atraumatic, normocephalic. Oropharynx and nasopharynx clear.  NECK:  Supple, no jugular venous distention. No thyroid enlargement, no tenderness.  LUNGS: Normal breath sounds bilaterally, no wheezing, rales,rhonchi or crepitation. No use of accessory muscles of respiration.  CARDIOVASCULAR: Regular rate and rhythm, S1, S2 normal. No murmurs, rubs, or gallops.  ABDOMEN: Soft, nondistended, nontender. Bowel sounds present. No organomegaly or mass.  EXTREMITIES: No pedal edema, cyanosis, or clubbing.  NEUROLOGIC: Cranial nerves II through XII are intact. Muscle strength 5/5 in all extremities. Sensation intact. Gait not checked.  PSYCHIATRIC: The patient is alert and oriented x 3.  Normal affect and good eye contact. SKIN: No obvious rash, lesion, or ulcer.   LABORATORY PANEL:   CBC Recent Labs  Lab 07/11/22 1731  WBC 5.0  HGB 4.2*  HCT 17.3*  PLT 650*   ------------------------------------------------------------------------------------------------------------------  Chemistries  Recent Labs  Lab 07/11/22 1731  NA 140  K 3.1*  CL 109  CO2 23  GLUCOSE 90  BUN 10  CREATININE 0.75  CALCIUM 8.9  AST 17  ALT 10  ALKPHOS 35*  BILITOT 0.6   ------------------------------------------------------------------------------------------------------------------  Cardiac Enzymes No results for input(s): "TROPONINI" in the last 168  hours. ------------------------------------------------------------------------------------------------------------------  RADIOLOGY:  CT Abdomen Pelvis W Contrast  Result Date: 07/11/2022 CLINICAL DATA:  Abdominal pain, acute, nonlocalized EXAM: CT ABDOMEN AND PELVIS WITH CONTRAST TECHNIQUE: Multidetector CT imaging of the abdomen and pelvis was performed using the standard protocol following bolus administration of intravenous contrast. RADIATION DOSE REDUCTION: This exam was performed according to the departmental dose-optimization program which includes automated exposure control, adjustment of the mA and/or kV according to patient size and/or use of iterative reconstruction technique. CONTRAST:  140m OMNIPAQUE IOHEXOL 300 MG/ML  SOLN COMPARISON:  Pelvic ultrasound 03/12/2021.  Remote CT 10/04/2009 FINDINGS: Lower chest: Borderline cardiomegaly. No pleural effusion or confluent airspace disease. Hepatobiliary: No focal liver abnormality is seen. No gallstones, gallbladder wall thickening, or biliary dilatation. Pancreas: Unremarkable. No pancreatic ductal dilatation or surrounding inflammatory changes. Spleen: Normal in size without focal abnormality. Adrenals/Urinary Tract: Normal adrenal glands. No hydronephrosis or focal renal abnormality. No renal calculi. Partially distended urinary bladder, displaced anteriorly by a enlarged fibroid uterus. Stomach/Bowel: Bowel assessment is limited in the absence of enteric contrast. Unremarkable appearance of the stomach. Few fluid-filled  loops of small bowel without obstruction or inflammatory change. The appendix is normal small to moderate volume of colonic stool. No colonic inflammation. Vascular/Lymphatic: Normal caliber abdominal aorta, mild atherosclerosis. The portal and splenic veins are patent. No abdominopelvic adenopathy. Reproductive: Heterogeneous enlarged uterus with multiple fibroids. This includes a 6 cm anterior fundal fibroid, 6.2 cm posterior  uterine body fibroid, and 5 cm left anterior uterine body fibroid. There is fluid within the endometrial canal, not abnormally distended in a premenopausal patient. Tampon in the vagina. The ovaries are not well-defined on the current exam. Other: No free air or ascites.  No abdominal wall hernia. Musculoskeletal: There are no acute or suspicious osseous abnormalities. IMPRESSION: 1. No acute abnormality in the abdomen/pelvis. 2. Enlarged fibroid uterus. 3. Borderline cardiomegaly. Aortic Atherosclerosis (ICD10-I70.0). Electronically Signed   By: Keith Rake M.D.   On: 07/11/2022 19:04      IMPRESSION AND PLAN:  Assessment and Plan: * Symptomatic anemia - The patient will be admitted to a medical telemetry bed. - This is acute on chronic blood loss anemia likely due to fibroid uterus. - She was typed and crossmatched and will be transfused 2 units of packed red blood cells. - We will follow posttransfusion H&H as well as serial H&H level. - We will resume her progesterone for now pending GYN consult.  Fibroid uterus - GYN consult will be obtained. - The patient has been following with Dr. Ilda Basset at [facility] for [facility] health care.  Dyslipidemia - She has been on statin therapy in the past and has stopped it. - We will check fasting lipids in a.m.  Tobacco use disorder She was counseled for smoking cessation and will receive further counseling here.    DVT prophylaxis: SCDs. Advanced Care Planning:  Code Status: full code.  Family Communication:  The plan of care was discussed in details with the patient (and family). I answered all questions. The patient agreed to proceed with the above mentioned plan. Further management will depend upon hospital course. Disposition Plan: Back to previous home environment Consults called: GYN consult can be called in AM. All the records are reviewed and case discussed with ED provider.  Status is: Inpatient   At the time of the admission, it  appears that the appropriate admission status for this patient is inpatient.  This is judged to be reasonable and necessary in order to provide the required intensity of service to ensure the patient's safety given the presenting symptoms, physical exam findings and initial radiographic and laboratory data in the context of comorbid conditions.  The patient requires inpatient status due to high intensity of service, high risk of further deterioration and high frequency of surveillance required.  I certify that at the time of admission, it is my clinical judgment that the patient will require inpatient hospital care extending more than 2 midnights.                            Dispo: The patient is from: Home              Anticipated d/c is to: Home              Patient currently is not medically stable to d/c.              Difficult to place patient: No  Christel Mormon M.D on 07/12/2022 at 12:51 AM  [facility]   From 7 PM-7 AM, contact night-coverage  [email or web address]  CC: Primary care physician; Camillia Herter, NP

## 2022-07-11 NOTE — Plan of Care (Signed)
Lab to type and screen.  Patient did not have blue band on wrist.  On-Call updated for order.Marland Kitchen

## 2022-07-11 NOTE — ED Provider Notes (Signed)
[facility] DEPT Provider Note   CSN: [id number] Arrival date & time: 07/11/22  1552     History  Chief Complaint  Patient presents with   Abdominal Pain    Wendy Ross is a 46 y.o. female.  46 year old female presents with abdominal pain which began acutely today while at work.  Patient states that she stood up and felt intense cramping and noticed some bloating.  Had nausea but no vomiting.  No fever or chills.  Last bowel movement was yesterday with normal stool.  Pain is characterized as intense and worse with movement.  Currently on her menstrual cycle.  Called EMS and was transported       Home Medications Prior to Admission medications   Medication Sig Start Date End Date Taking? Authorizing Provider  aspirin EC 81 MG EC tablet Take 1 tablet (81 mg total) by mouth daily. Swallow whole. Patient not taking: Reported on 02/26/2021 11/29/20   Zola Button, MD  atorvastatin (LIPITOR) 40 MG tablet Take 1 tablet (40 mg total) by mouth daily. Patient not taking: Reported on 02/26/2021 11/29/20   Zola Button, MD  doxycycline (VIBRAMYCIN) 100 MG capsule Take 1 capsule (100 mg total) by mouth 2 (two) times daily. Patient not taking: Reported on 02/26/2021 01/19/21   Hans Eden, NP  Drospirenone (SLYND) 4 MG TABS Take 1 tablet by mouth daily. Skip the placebo days and start a new pack instead to avoid a period 03/29/21   Aletha Halim, MD  ferrous sulfate 325 (65 FE) MG tablet Take 1 tablet (325 mg total) by mouth daily. 11/28/20   Zola Button, MD      Allergies    Percocet [oxycodone-acetaminophen]    Review of Systems   Review of Systems  All other systems reviewed and are negative.   Physical Exam Updated Vital Signs BP 120/70 (BP Location: Right Arm)   Pulse 76   Temp 97.7 F (36.5 C) (Oral)   Resp 18   Ht 1.626 m ('5\' 4"'$ )   Wt 70.3 kg   LMP 07/11/2022   SpO2 100%   BMI 26.61 kg/m  Physical Exam Vitals and nursing note  reviewed.  Constitutional:      General: She is not in acute distress.    Appearance: Normal appearance. She is well-developed. She is not toxic-appearing.  HENT:     Head: Normocephalic and atraumatic.  Eyes:     General: Lids are normal.     Conjunctiva/sclera: Conjunctivae normal.     Pupils: Pupils are equal, round, and reactive to light.  Neck:     Thyroid: No thyroid mass.     Trachea: No tracheal deviation.  Cardiovascular:     Rate and Rhythm: Normal rate and regular rhythm.     Heart sounds: Normal heart sounds. No murmur heard.    No gallop.  Pulmonary:     Effort: Pulmonary effort is normal. No respiratory distress.     Breath sounds: Normal breath sounds. No stridor. No decreased breath sounds, wheezing, rhonchi or rales.  Abdominal:     General: There is no distension.     Palpations: Abdomen is soft.     Tenderness: There is no abdominal tenderness. There is no rebound.  Musculoskeletal:        General: No tenderness. Normal range of motion.     Cervical back: Normal range of motion and neck supple.  Skin:    General: Skin is warm and dry.  Findings: No abrasion or rash.  Neurological:     Mental Status: She is alert and oriented to person, place, and time. Mental status is at baseline.     GCS: GCS eye subscore is 4. GCS verbal subscore is 5. GCS motor subscore is 6.     Cranial Nerves: No cranial nerve deficit.     Sensory: No sensory deficit.     Motor: Motor function is intact.  Psychiatric:        Attention and Perception: Attention normal.        Speech: Speech normal.        Behavior: Behavior normal.     ED Results / Procedures / Treatments   Labs (all labs ordered are listed, but only abnormal results are displayed) Labs Reviewed  LIPASE, BLOOD  COMPREHENSIVE METABOLIC PANEL  CBC  URINALYSIS, ROUTINE W REFLEX MICROSCOPIC  I-STAT BETA HCG BLOOD, ED ([facility], [facility], [facility] ONLY)    EKG None  Radiology No results found.  Procedures Procedures     Medications Ordered in ED Medications - No data to display  ED Course/ Medical Decision Making/ A&P                           Medical Decision Making Amount and/or Complexity of Data Reviewed Labs: ordered. Radiology: ordered.  Risk Prescription drug management.  Patient with mild hypokalemia patient given oral potassium Patient presented with abdominal pain for cute onset.  Concern for possible obstruction.  Abdominal CT per my review interpretation was negative for acute process.  Patient is labs significant for severe anemia with hemoglobin of 4.2.  Patient has known history of uterine fibroids and has been having heavy menstrual bleeding for some time.  Has been seen by gynecology in the past was recommended that she have hysterectomy.  She is day 3 of her menstrual cycle currently right now.  She is not taking iron therapy.  Plan will be to transfuse patient with packed red blood cells and admit to the hospitalist        Final Clinical Impression(s) / ED Diagnoses Final diagnoses:  None    Rx / DC Orders ED Discharge Orders     None         Lacretia Leigh, MD 07/11/22 1952

## 2022-07-11 NOTE — ED Triage Notes (Addendum)
Per EMS- Patient was at work urinating and when she stood  she had a sudden onset of mid abdominal pain. Patient describes as "ripping and twisting pain."  Patient added in triage that she is currently on her period.

## 2022-07-12 DIAGNOSIS — E785 Hyperlipidemia, unspecified: Secondary | ICD-10-CM

## 2022-07-12 DIAGNOSIS — D62 Acute posthemorrhagic anemia: Secondary | ICD-10-CM

## 2022-07-12 DIAGNOSIS — D649 Anemia, unspecified: Secondary | ICD-10-CM | POA: Diagnosis not present

## 2022-07-12 LAB — CBC
HCT: 22.5 % — ABNORMAL LOW (ref 36.0–46.0)
Hemoglobin: 6.1 g/dL — CL (ref 12.0–15.0)
MCH: 19.5 pg — ABNORMAL LOW (ref 26.0–34.0)
MCHC: 27.1 g/dL — ABNORMAL LOW (ref 30.0–36.0)
MCV: 71.9 fL — ABNORMAL LOW (ref 80.0–100.0)
Platelets: 419 10*3/uL — ABNORMAL HIGH (ref 150–400)
RBC: 3.13 MIL/uL — ABNORMAL LOW (ref 3.87–5.11)
RDW: 28.3 % — ABNORMAL HIGH (ref 11.5–15.5)
WBC: 5.2 10*3/uL (ref 4.0–10.5)
nRBC: 0 % (ref 0.0–0.2)

## 2022-07-12 LAB — BASIC METABOLIC PANEL
Anion gap: 4 — ABNORMAL LOW (ref 5–15)
BUN: 8 mg/dL (ref 6–20)
CO2: 24 mmol/L (ref 22–32)
Calcium: 8.5 mg/dL — ABNORMAL LOW (ref 8.9–10.3)
Chloride: 114 mmol/L — ABNORMAL HIGH (ref 98–111)
Creatinine, Ser: 0.7 mg/dL (ref 0.44–1.00)
GFR, Estimated: >60 mL/min (ref >60)
Glucose, Bld: 87 mg/dL (ref 70–99)
Potassium: 3.8 mmol/L (ref 3.5–5.1)
Sodium: 142 mmol/L (ref 135–145)

## 2022-07-12 LAB — TYPE AND SCREEN
ABO/RH(D): O POS
Antibody Screen: NEGATIVE
Unit division: 0
Unit division: 0

## 2022-07-12 LAB — BPAM RBC
Blood Product Expiration Date: 202310042359
Blood Product Expiration Date: 202310042359
ISSUE DATE / TIME: 202309050202
ISSUE DATE / TIME: 202309050429
Unit Type and Rh: 5100
Unit Type and Rh: 5100

## 2022-07-12 LAB — HIV ANTIBODY (ROUTINE TESTING W REFLEX): HIV Screen 4th Generation wRfx: NONREACTIVE

## 2022-07-12 LAB — PREPARE RBC (CROSSMATCH)

## 2022-07-12 MED ORDER — FERROUS SULFATE 325 (65 FE) MG PO TABS: 325.0000 mg | ORAL_TABLET | Freq: Two times a day (BID) | ORAL | 0 refills | Status: DC

## 2022-07-12 MED ORDER — PROGESTERONE MICRONIZED 100 MG PO CAPS
100.0000 mg | ORAL_CAPSULE | Freq: Every day | ORAL | 0 refills | Status: DC
Start: 2022-07-12 — End: 2022-07-28

## 2022-07-12 MED ORDER — SODIUM CHLORIDE 0.9% IV SOLUTION: Freq: Once | INTRAVENOUS | Status: DC

## 2022-07-12 NOTE — Assessment & Plan Note (Signed)
-   GYN consult will be obtained. - The patient has been following with Dr. Ilda Basset at [facility] for [facility] health care.

## 2022-07-12 NOTE — Assessment & Plan Note (Signed)
-   She has been on statin therapy in the past and has stopped it. - We will check fasting lipids in a.m.

## 2022-07-12 NOTE — Discharge Summary (Signed)
Physician Discharge Summary  Wendy Ross WJX:[id number] DOB: 1976-02-08 DOA: 07/11/2022  PCP: Camillia Herter, NP  Admit date: 07/11/2022 Discharge date: 07/12/2022  Admitted From: Home Disposition: Home  Recommendations for Outpatient Follow-up:  Follow up with PCP in 1-2 weeks Please obtain CBC in 1 week Please call your OB/GYN for follow-up.  Home Health: N/A Equipment/Devices: N/A  Discharge Condition: Stable CODE STATUS: Full code Diet recommendation: Regular diet  Discharge summary:  46 year old with history of stroke associated with acute COVID-19 infection and currently without neurological deficits, chronic iron deficiency anemia secondary to menorrhagia with fibroid uterus, lacked insurance and unable to follow-up with OB/GYN and for surgery presented with generalized weakness and fatigue for about 2 weeks.  In the emergency room hemodynamically stable.  Hemoglobin 4.2.  Previous hemoglobin 7.8, 6 months ago.  CT scan abdomen pelvis consistent with fibroid uterus but no other acute abnormalities.  Potassium 3.1.  Admitted due to significant symptoms, symptomatic anemia. Hemoglobin 4.2-2 units of PRBC transfusion-appropriate response to 6.1-1 additional PRBC before discharge today.  Recheck hemoglobin in 1 week.  Since patient does not have evidence of ongoing bleeding and she has responded appropriately, no need to recheck today again. Patient does have bleeding fibroids, previously recommended hysterectomy by her OB/GYN.  Patient has now established with healthcare coverage, she is going to schedule follow-up with her OB/GYN. Patient was prescribed progesterone in the past which she could not afford, will prescribe in the meantime. Oral iron therapy prescribed.  Patient does have a history of embolic a stroke in the context of acute COVID-19 infection, she took antiplatelet therapy for about 6 months and is stopped since.  Does not have any evidence of new neurological  deficits.  She does not have any contraindication to go for hysterectomy if that becomes necessary.  Will not resume aspirin given risk of bleeding.  Stable for discharge.   Discharge Diagnoses:  Principal Problem:   Symptomatic anemia Active Problems:   Fibroid uterus   Dyslipidemia   Tobacco use disorder   Acute on chronic blood loss anemia    Discharge Instructions  Discharge Instructions     Diet general   Complete by: As directed    Discharge instructions   Complete by: As directed    Call ans schedule follow up with your ObGyn as soon as possible.   Increase activity slowly   Complete by: As directed       Allergies as of 07/12/2022       Reactions   Latex Itching   Percocet [oxycodone-acetaminophen] Itching, Nausea Only        Medication List     STOP taking these medications    aspirin EC 81 MG tablet   atorvastatin 40 MG tablet Commonly known as: LIPITOR   doxycycline 100 MG capsule Commonly known as: VIBRAMYCIN   Slynd 4 MG Tabs Generic drug: Drospirenone       TAKE these medications    ferrous sulfate 325 (65 FE) MG tablet Take 1 tablet (325 mg total) by mouth 2 (two) times daily with a meal. What changed: when to take this   progesterone 100 MG capsule Commonly known as: PROMETRIUM Take 1 capsule (100 mg total) by mouth daily.        Follow-up Information     Aletha Halim, MD. Schedule an appointment as soon as possible for a visit.   Specialty: Obstetrics and Gynecology Contact information: [place] [address] [phone]  Allergies  Allergen Reactions   Latex Itching   Percocet [Oxycodone-Acetaminophen] Itching and Nausea Only    Consultations: None   Procedures/Studies: CT Abdomen Pelvis W Contrast  Result Date: 07/11/2022 CLINICAL DATA:  Abdominal pain, acute, nonlocalized EXAM: CT ABDOMEN AND PELVIS WITH CONTRAST TECHNIQUE: Multidetector CT imaging of  the abdomen and pelvis was performed using the standard protocol following bolus administration of intravenous contrast. RADIATION DOSE REDUCTION: This exam was performed according to the departmental dose-optimization program which includes automated exposure control, adjustment of the mA and/or kV according to patient size and/or use of iterative reconstruction technique. CONTRAST:  13m OMNIPAQUE IOHEXOL 300 MG/ML  SOLN COMPARISON:  Pelvic ultrasound 03/12/2021.  Remote CT 10/04/2009 FINDINGS: Lower chest: Borderline cardiomegaly. No pleural effusion or confluent airspace disease. Hepatobiliary: No focal liver abnormality is seen. No gallstones, gallbladder wall thickening, or biliary dilatation. Pancreas: Unremarkable. No pancreatic ductal dilatation or surrounding inflammatory changes. Spleen: Normal in size without focal abnormality. Adrenals/Urinary Tract: Normal adrenal glands. No hydronephrosis or focal renal abnormality. No renal calculi. Partially distended urinary bladder, displaced anteriorly by a enlarged fibroid uterus. Stomach/Bowel: Bowel assessment is limited in the absence of enteric contrast. Unremarkable appearance of the stomach. Few fluid-filled loops of small bowel without obstruction or inflammatory change. The appendix is normal small to moderate volume of colonic stool. No colonic inflammation. Vascular/Lymphatic: Normal caliber abdominal aorta, mild atherosclerosis. The portal and splenic veins are patent. No abdominopelvic adenopathy. Reproductive: Heterogeneous enlarged uterus with multiple fibroids. This includes a 6 cm anterior fundal fibroid, 6.2 cm posterior uterine body fibroid, and 5 cm left anterior uterine body fibroid. There is fluid within the endometrial canal, not abnormally distended in a premenopausal patient. Tampon in the vagina. The ovaries are not well-defined on the current exam. Other: No free air or ascites.  No abdominal wall hernia. Musculoskeletal: There are no  acute or suspicious osseous abnormalities. IMPRESSION: 1. No acute abnormality in the abdomen/pelvis. 2. Enlarged fibroid uterus. 3. Borderline cardiomegaly. Aortic Atherosclerosis (ICD10-I70.0). Electronically Signed   By: MKeith RakeM.D.   On: 07/11/2022 19:04   (Echo, Carotid, EGD, Colonoscopy, ERCP)    Subjective: Patient seen and examined.  Received 2 units of PRBC overnight.  Evaluated for discharge readiness after third blood unit and did fairly well.  Eager to go home.   Discharge Exam: Vitals:   07/12/22 1220 07/12/22 1440  BP: 121/70 103/84  Pulse: 65 60  Resp: 16 14  Temp: 98.8 F (37.1 C) 98 F (36.7 C)  SpO2: 100% 100%   Vitals:   07/12/22 1016 07/12/22 1202 07/12/22 1220 07/12/22 1440  BP: 115/75 (!) 107/57 121/70 103/84  Pulse: 66 64 65 60  Resp: '16 16 16 14  '$ Temp: 98.5 F (36.9 C) 98.1 F (36.7 C) 98.8 F (37.1 C) 98 F (36.7 C)  TempSrc: Oral Oral Oral Oral  SpO2: 100% 100% 100% 100%  Weight:      Height:        General: Pt is alert, awake, not in acute distress, pale  Cardiovascular: RRR, S1/S2 +, no rubs, no gallops Respiratory: CTA bilaterally, no wheezing, no rhonchi Abdominal: Soft, NT, ND, bowel sounds + Extremities: no edema, no cyanosis    The results of significant diagnostics from this hospitalization (including imaging, microbiology, ancillary and laboratory) are listed below for reference.     Microbiology: No results found for this or any previous visit (from the past 240 hour(s)).   Labs: BNP (last 3 results) No results for  input(s): "BNP" in the last 8760 hours. Basic Metabolic Panel: Recent Labs  Lab 07/11/22 1731 07/12/22 0844  NA 140 142  K 3.1* 3.8  CL 109 114*  CO2 23 24  GLUCOSE 90 87  BUN 10 8  CREATININE 0.75 0.70  CALCIUM 8.9 8.5*   Liver Function Tests: Recent Labs  Lab 07/11/22 1731  AST 17  ALT 10  ALKPHOS 35*  BILITOT 0.6  PROT 7.2  ALBUMIN 3.9   Recent Labs  Lab 07/11/22 1731  LIPASE 28    No results for input(s): "AMMONIA" in the last 168 hours. CBC: Recent Labs  Lab 07/11/22 1731 07/12/22 0844  WBC 5.0 5.2  HGB 4.2* 6.1*  HCT 17.3* 22.5*  MCV 66.8* 71.9*  PLT 650* 419*   Cardiac Enzymes: No results for input(s): "CKTOTAL", "CKMB", "CKMBINDEX", "TROPONINI" in the last 168 hours. BNP: Invalid input(s): "POCBNP" CBG: No results for input(s): "GLUCAP" in the last 168 hours. D-Dimer No results for input(s): "DDIMER" in the last 72 hours. Hgb A1c No results for input(s): "HGBA1C" in the last 72 hours. Lipid Profile No results for input(s): "CHOL", "HDL", "LDLCALC", "TRIG", "CHOLHDL", "LDLDIRECT" in the last 72 hours. Thyroid function studies No results for input(s): "TSH", "T4TOTAL", "T3FREE", "THYROIDAB" in the last 72 hours.  Invalid input(s): "FREET3" Anemia work up No results for input(s): "VITAMINB12", "FOLATE", "FERRITIN", "TIBC", "IRON", "RETICCTPCT" in the last 72 hours. Urinalysis    Component Value Date/Time   COLORURINE YELLOW 07/11/2022 1701   APPEARANCEUR CLEAR 07/11/2022 1701   LABSPEC 1.014 07/11/2022 1701   PHURINE 6.0 07/11/2022 1701   GLUCOSEU NEGATIVE 07/11/2022 1701   HGBUR NEGATIVE 07/11/2022 1701   BILIRUBINUR NEGATIVE 07/11/2022 1701   KETONESUR NEGATIVE 07/11/2022 1701   PROTEINUR NEGATIVE 07/11/2022 1701   UROBILINOGEN 0.2 02/26/2021 1134   NITRITE NEGATIVE 07/11/2022 1701   LEUKOCYTESUR NEGATIVE 07/11/2022 1701   Sepsis Labs Recent Labs  Lab 07/11/22 1731 07/12/22 0844  WBC 5.0 5.2   Microbiology No results found for this or any previous visit (from the past 240 hour(s)).   Time coordinating discharge: 35 minutes  SIGNED:   Barb Merino, MD  [facility] 07/12/2022, 3:39 PM

## 2022-07-12 NOTE — Progress Notes (Signed)
   07/12/22 0151  Assess: MEWS Score  Temp 97.7 F (36.5 C)  BP (!) 79/52  MAP (mmHg) (!) 63  Pulse Rate 76  Resp 16  SpO2 100 %  O2 Device Room Air  Assess: MEWS Score  MEWS Temp 0  MEWS Systolic 2  MEWS Pulse 0  MEWS RR 0  MEWS LOC 0  MEWS Score 2  MEWS Score Color Yellow  Assess: if the MEWS score is Yellow or Red  Were vital signs taken at a resting state? Yes  Focused Assessment Change from prior assessment (see assessment flowsheet)  Does the patient meet 2 or more of the SIRS criteria? No  MEWS guidelines implemented *See Row Information* Yes  Treat  MEWS Interventions Escalated (See documentation below)  Pain Scale 0-10  Pain Score 2  Pain Type Acute pain  Pain Location Abdomen  Pain Orientation Mid;Lower  Pain Descriptors / Indicators Sore  Pain Frequency Constant  Pain Onset On-going  Patients Stated Pain Goal 2  Pain Intervention(s) Refused  Multiple Pain Sites No  Take Vital Signs  Increase Vital Sign Frequency  Yellow: Q 2hr X 2 then Q 4hr X 2, if remains yellow, continue Q 4hrs  Escalate  MEWS: Escalate Yellow: discuss with charge nurse/RN and consider discussing with provider and RRT  Notify: Charge Nurse/RN  Date Charge Nurse/RN Notified 07/12/22  Time Charge Nurse/RN Notified 0200  Notify: Provider  Provider Name/Title Gershon Cull  Date Provider Notified 07/12/22  Time Provider Notified 0224  Method of Notification Page  Notification Reason Change in status  Provider response Other (Comment) (BP improved with next reading)  Date of Provider Response 07/12/22  Time of Provider Response 0234  Notify: Rapid Response  Name of Rapid Response RN Notified RN  Date Rapid Response Notified 07/12/22  Time Rapid Response Notified 0225  Document  Patient Outcome Stabilized after interventions  Assess: SIRS CRITERIA  SIRS Temperature  0  SIRS Pulse 0  SIRS Respirations  0  SIRS WBC 1  SIRS Score Sum  1   Received two units of blood this shift.   Tolerated well. MEWS 1 point for soft BP.  Pain reduced to #2 without medication.  Tearful this morning.  Informed of effect progesterone may have.

## 2022-07-12 NOTE — Assessment & Plan Note (Signed)
She was counseled for smoking cessation and will receive further counseling here. 

## 2022-07-12 NOTE — Assessment & Plan Note (Addendum)
-   The patient will be admitted to a medical telemetry bed. - This is acute on chronic blood loss anemia likely due to fibroid uterus. - She was typed and crossmatched and will be transfused 2 units of packed red blood cells. - We will follow posttransfusion H&H as well as serial H&H level. - We will resume her progesterone for now pending GYN consult.

## 2022-07-12 NOTE — Progress Notes (Signed)
.   Transition of Care [facility]) Screening Note   Patient Details  Name: Wendy Ross Date of Birth: 1976/09/26   Transition of Care [facility]) CM/SW Contact:    Illene Regulus, LCSW Phone Number: 07/12/2022, 11:09 AM    Transition of Care Department [facility]) has reviewed patient and no TOC needs have been identified at this time. We will continue to monitor patient advancement through interdisciplinary progression rounds. If new patient transition needs arise, please place a TOC consult.

## 2022-07-13 ENCOUNTER — Telehealth: Payer: Self-pay

## 2022-07-13 LAB — TYPE AND SCREEN
ABO/RH(D): O POS
Antibody Screen: NEGATIVE
Unit division: 0
Unit division: 0
Unit division: 0

## 2022-07-13 LAB — BPAM RBC
Blood Product Expiration Date: 202310042359
Blood Product Expiration Date: 202310042359
Blood Product Expiration Date: 202310042359
ISSUE DATE / TIME: 202309050202
ISSUE DATE / TIME: 202309050429
ISSUE DATE / TIME: 202309051157
Unit Type and Rh: 5100
Unit Type and Rh: 5100
Unit Type and Rh: 5100

## 2022-07-13 NOTE — Telephone Encounter (Signed)
Transition Care Management Follow-up Telephone Call Date of discharge and from where: 07/12/2022, [facility] How have you been since you were released from the hospital? She said she is feeling " so, so" weak, not very good.  Anxious to see Dr Ilda Basset later this month and have her surgery scheduled.  She said she was not able to have the procedure done earlier because she lacked insurance coverage.   She plans to see Ms Minette Brine, NP again but wanted to wait until the end of October, hopefully after her procedure.  Any questions or concerns? No  Items Reviewed: Did the pt receive and understand the discharge instructions provided? Yes  Medications obtained and verified?  She needs to pick up her 2 medications that were prescribed.  Other? No  Any new allergies since your discharge? No  Dietary orders reviewed? No Do you have support at home? Yes   Home Care and Equipment/Supplies: Were home health services ordered? no If so, what is the name of the agency? N/a  Has the agency set up a time to come to the patient's home? not applicable Were any new equipment or medical supplies ordered?  No What is the name of the medical supply agency? N/a Were you able to get the supplies/equipment? not applicable Do you have any questions related to the use of the equipment or supplies? No  Functional Questionnaire: (I = Independent and D = Dependent) ADLs: independent   Follow up appointments reviewed:  PCP Hospital f/u appt confirmed? Yes  Scheduled to see Durene Fruits NP - 08/30/2022.  Needs to re-establish care.  [facility] f/u appt confirmed? Yes  Scheduled to see Dr Ilda Basset- 07/28/2022.   Are transportation arrangements needed? No  If their condition worsens, is the pt aware to call PCP or go to the Emergency Dept.? Yes Was the patient provided with contact information for the PCP's office or ED? Yes Was to pt encouraged to call back with questions or concerns? Yes

## 2022-07-28 ENCOUNTER — Other Ambulatory Visit: Payer: Self-pay

## 2022-07-28 ENCOUNTER — Ambulatory Visit (INDEPENDENT_AMBULATORY_CARE_PROVIDER_SITE_OTHER): Payer: 59 | Admitting: Obstetrics and Gynecology

## 2022-07-28 ENCOUNTER — Encounter: Payer: Self-pay | Admitting: Obstetrics and Gynecology

## 2022-07-28 ENCOUNTER — Other Ambulatory Visit (HOSPITAL_COMMUNITY)
Admission: RE | Admit: 2022-07-28 | Discharge: 2022-07-28 | Disposition: A | Discharge status: Home / Self Care | Payer: 59 | Source: Ambulatory Visit | Attending: Obstetrics and Gynecology | Admitting: Obstetrics and Gynecology

## 2022-07-28 ENCOUNTER — Other Ambulatory Visit (HOSPITAL_COMMUNITY)
Admission: RE | Admit: 2022-07-28 | Discharge: 2022-07-28 | Disposition: A | Discharge status: Home / Self Care | Payer: 59 | Source: Ambulatory Visit

## 2022-07-28 VITALS — BP 137/87 | HR 80 | Wt 156.6 lb

## 2022-07-28 DIAGNOSIS — D500 Iron deficiency anemia secondary to blood loss (chronic): Secondary | ICD-10-CM | POA: Insufficient documentation

## 2022-07-28 DIAGNOSIS — N898 Other specified noninflammatory disorders of vagina: Secondary | ICD-10-CM | POA: Diagnosis not present

## 2022-07-28 DIAGNOSIS — N920 Excessive and frequent menstruation with regular cycle: Secondary | ICD-10-CM | POA: Insufficient documentation

## 2022-07-28 DIAGNOSIS — Z01812 Encounter for preprocedural laboratory examination: Secondary | ICD-10-CM | POA: Diagnosis not present

## 2022-07-28 DIAGNOSIS — D219 Benign neoplasm of connective and other soft tissue, unspecified: Secondary | ICD-10-CM | POA: Diagnosis not present

## 2022-07-28 LAB — POCT PREGNANCY, URINE: Preg Test, Ur: NEGATIVE

## 2022-07-28 MED ORDER — MEGESTROL ACETATE 40 MG PO TABS: 40.0000 mg | ORAL_TABLET | Freq: Two times a day (BID) | ORAL | 1 refills | Status: DC

## 2022-07-28 NOTE — Progress Notes (Signed)
Obstetrics and Gynecology Established Patient Evaluation  Appointment Date: 07/28/2022  OBGYN Clinic: [facility]   Primary Care Provider: Camillia Herter  Referring Provider: Hospitalist service  Chief Complaint: Anemia, AUB  History of Present Illness: Wendy Ross is a 46 y.o. African-American [id number] (Patient's last menstrual period was 07/11/2022.), seen for the above chief complaint. Her past medical history is significant for h/o BTL, fibroids, 2022 CVA (?due to covid, cocaine use, anemia)  Patient recently admitted to the hospitalist service 9/4-9/5 after presenting with weakeness and diagnosed with profound anemia. She received 3U PRBC and discharged to home on prometrium.  I last saw her May 2022 for follow up AUB. She didn't have insurance and plan was to continue on POPs and follow up in a few months.U/s at that time showed a 12 x 9 x 10cm uterus with at least 7cm fibroids noted.  Patient w/o s/s of anemia and no current bleeding.  She also states she also has insurance coverage.   Review of Systems: Pertinent items noted in HPI and remainder of comprehensive ROS otherwise negative.    Patient Active Problem List   Diagnosis Date Noted   Dyslipidemia 07/12/2022   Acute on chronic blood loss anemia    Menorrhagia with regular cycle 03/29/2021   Fibroid uterus 03/19/2021   Cocaine use    Tobacco use disorder    Cerebral embolism with cerebral infarction 11/26/2020   Symptomatic anemia 11/25/2020   Abnormal uterine bleeding (AUB) 07/05/2017   Dysmenorrhea 07/05/2017   Eczema 07/05/2017    Past Medical History:  Past Medical History:  Diagnosis Date   Asthma    COVID-19 11/2020   Hypercholesteremia    Stroke ([place])    Vaginal Pap smear, abnormal     Past Surgical History:  Past Surgical History:  Procedure Laterality Date   TUBAL LIGATION     WISDOM TOOTH EXTRACTION      Past Obstetrical History:  OB History   Gravida Para Term Preterm AB Living  '6 4 4   2 4  '$ SAB IAB Ectopic Multiple Live Births    2     4    # Outcome Date GA Lbr Len/2nd Weight Sex Delivery Anes PTL Lv  6 Term 2004    M Vag-Spont   LIV  5 Term 2000    F Vag-Spont   LIV  4 IAB 1996          3 Term 1995    F Vag-Spont   LIV  2 IAB 1993          1 Term 1992     Vag-Spont   LIV    Past Gynecological History: As per HPI. Periods: qmonth, heavy and painful, 1wk; She has used OCPs, depo provera and nexplanon in the past and liked all of them but the nexplanon did migrate History of Pap Smear(s): Yes.   Last pap April 2022, which was cytology and HPV negative.   Social History:  Social History   Socioeconomic History   Marital status: Married    Spouse name: Not on file   Number of children: Not on file   Years of education: Not on file   Highest education level: Not on file  Occupational History   Not on file  Tobacco Use   Smoking status: Every Day    Packs/day: 0.25    Types: Cigarettes   Smokeless tobacco: Never  Vaping Use   Vaping Use: Every  day   Substances: THC  Substance and Sexual Activity   Alcohol use: Yes    Comment: social   Drug use: No   Sexual activity: Yes    Birth control/protection: Surgical  Other Topics Concern   Not on file  Social History Narrative   Not on file   Social Determinants of Health   Financial Resource Strain: Not on file  Food Insecurity: Food Insecurity Present (07/28/2022)   Hunger Vital Sign    Worried About Running Out of Food in the Last Year: Often true    Ran Out of Food in the Last Year: Often true  Transportation Needs: No Transportation Needs (07/28/2022)   PRAPARE - Hydrologist (Medical): No    Lack of Transportation (Non-Medical): No  Physical Activity: Not on file  Stress: Not on file  Social Connections: Not on file  Intimate Partner Violence: Not on file    Family History:  Family History  Problem Relation Age of  Onset   Hypertension Mother    Hypertension Other    COPD Father    Tuberculosis Father    Hypertension Sister    Hypertension Brother     Medications [name] had no medications administered during this visit. Current Outpatient Medications  Medication Sig Dispense Refill   ferrous sulfate 325 (65 FE) MG tablet Take 1 tablet (325 mg total) by mouth 2 (two) times daily with a meal. 60 tablet 0   No current facility-administered medications for this visit.    Allergies Latex and Percocet [oxycodone-acetaminophen]   Physical Exam:  BP 137/87   Pulse 80   Wt 156 lb 9.6 oz (71 kg)   LMP 07/11/2022   BMI 26.88 kg/m  Body mass index is 26.88 kg/m. General appearance: Well nourished, well developed female in no acute distress.  Neck:  Supple, normal appearance, and no thyromegaly  Cardiovascular: normal s1 and s2.  No murmurs, rubs or gallops. Respiratory:  Clear to auscultation bilateral. Normal respiratory effort Abdomen: positive bowel sounds and no masses, hernias; diffusely non tender to palpation, non distended Neuro/Psych:  Normal mood and affect.  Skin:  Warm and dry.  Lymphatic:  No inguinal lymphadenopathy.   Pelvic exam: is not limited by body habitus EGBUS: within normal limits Vagina: within normal limits and with no blood or discharge in the vault Cervix: normal appearing cervix without tenderness, discharge or lesions. Uterus:  enlarged, c/w 14-16 week size and non tender, mobile Adnexa:  normal adnexa and no mass, fullness, tenderness Rectovaginal: deferred  See procedure note for embx  Laboratory: no new labs   Radiology: no new imaging  Assessment: pt stable  Plan:  1. Vaginal discharge - Cervicovaginal ancillary only( [place])  2. Iron deficiency anemia due to chronic blood loss Pt confirms on oral iron - Surgical pathology( [place]/ [facility]) - Anemia Profile B  3. Menorrhagia with regular cycle It sounds like megace has  somewhat in the past so will start megace 40 bid and continue until labs, imaging are completed for final plan. She would like to proceed with definitive therapy via hysterectomy I d/w her re: various options. In terms of medicine, she has already tried a lot in the past and given her CVA history, I would only consider continue on some type of progestin. As for surgery, d/w her re: [place] as well as fibroid ablation techniques like Sonata. I got an updated ultrasound but I also told her that based  on her exam, that she is borderline for my limit for laparoscopic approach vs an open approach. Will follow up with her after u/s is done - Surgical pathology( [place]/ [facility]) - Anemia Profile B  4. Fibroids - US PELVIC COMPLETE WITH TRANSVAGINAL; Future  Orders Placed This Encounter  Procedures   US PELVIC COMPLETE WITH TRANSVAGINAL   Anemia Profile B   Pregnancy, urine POC    RTC after u/s.   Durene Romans MD Attending Center for [employer] [facility])

## 2022-07-29 LAB — ANEMIA PROFILE B
Basophils Absolute: 0 10*3/uL (ref 0.0–0.2)
Basos: 1 %
EOS (ABSOLUTE): 0.3 10*3/uL (ref 0.0–0.4)
Eos: 4 %
Ferritin: 46 ng/mL (ref 15–150)
Folate: 7.8 ng/mL (ref >3.0)
Hematocrit: 34.2 % (ref 34.0–46.6)
Hemoglobin: 10.1 g/dL — ABNORMAL LOW (ref 11.1–15.9)
Immature Grans (Abs): 0 10*3/uL (ref 0.0–0.1)
Immature Granulocytes: 0 %
Iron Saturation: 82 % (ref 15–55)
Iron: 343 ug/dL (ref 27–159)
Lymphocytes Absolute: 2 10*3/uL (ref 0.7–3.1)
Lymphs: 33 %
MCH: 23.7 pg — ABNORMAL LOW (ref 26.6–33.0)
MCHC: 29.5 g/dL — ABNORMAL LOW (ref 31.5–35.7)
MCV: 80 fL (ref 79–97)
Monocytes Absolute: 0.4 10*3/uL (ref 0.1–0.9)
Monocytes: 7 %
Neutrophils Absolute: 3.3 10*3/uL (ref 1.4–7.0)
Neutrophils: 55 %
Platelets: 495 10*3/uL — ABNORMAL HIGH (ref 150–450)
RBC: 4.27 x10E6/uL (ref 3.77–5.28)
RDW: 29.9 % — ABNORMAL HIGH (ref 11.7–15.4)
Retic Ct Pct: 1.4 % (ref 0.6–2.6)
Total Iron Binding Capacity: 416 ug/dL (ref 250–450)
UIBC: 73 ug/dL — ABNORMAL LOW (ref 131–425)
Vitamin B-12: 461 pg/mL (ref 232–1245)
WBC: 6 10*3/uL (ref 3.4–10.8)

## 2022-07-31 LAB — CERVICOVAGINAL ANCILLARY ONLY
Bacterial Vaginitis (gardnerella): POSITIVE — AB
Candida Glabrata: NEGATIVE
Candida Vaginitis: NEGATIVE
Chlamydia: NEGATIVE
Comment: NEGATIVE
Comment: NEGATIVE
Comment: NEGATIVE
Comment: NEGATIVE
Comment: NEGATIVE
Comment: NORMAL
Neisseria Gonorrhea: NEGATIVE
Trichomonas: NEGATIVE

## 2022-08-01 ENCOUNTER — Encounter: Payer: Self-pay | Admitting: Obstetrics and Gynecology

## 2022-08-01 HISTORY — PX: ENDOMETRIAL BIOPSY: PRO73

## 2022-08-01 LAB — SURGICAL PATHOLOGY

## 2022-08-01 MED ORDER — METRONIDAZOLE 500 MG PO TABS: 500.0000 mg | ORAL_TABLET | Freq: Two times a day (BID) | ORAL | 0 refills | Status: AC

## 2022-08-01 NOTE — Procedures (Signed)
Endometrial Biopsy Procedure Note  Pre-operative Diagnosis: AUB. Fibroids  Post-operative Diagnosis: same  Procedure Details  Urine pregnancy test was done and result was negative.  The risks (including infection, bleeding, pain, and uterine perforation) and benefits of the procedure were explained to the patient and Written informed consent was obtained.  The patient was placed in the dorsal lithotomy position.  Bimanual exam showed the uterus to be in the anteroflexed position.  A Graves' speculum inserted in the vagina, and the cervix was visualized. The cervix was then prepped with povidone iodine, and a sharp tenaculum was applied to the anterior lip of the cervix for stabilization.  A pipelle was inserted into the uterine cavity and sounded the uterus to a depth of 10cm.  A Small amount of tissue was collected after 2 passes. The sample was sent for pathologic examination.  Condition: Stable  Complications: None  Plan: The patient was advised to call for any fever or for prolonged or severe pain or bleeding. She was advised to use OTC analgesics as needed for mild to moderate pain. She was advised to avoid vaginal intercourse for 48 hours or until the bleeding has completely stopped.  Durene Romans MD Attending Center for [employer] [facility])

## 2022-08-02 ENCOUNTER — Encounter
Admission: RE | Admit: 2022-08-02 | Discharge: 2022-08-02 | Disposition: A | Discharge status: Home / Self Care | Payer: 59 | Source: Ambulatory Visit | Attending: Obstetrics and Gynecology | Admitting: Obstetrics and Gynecology

## 2022-08-02 ENCOUNTER — Ambulatory Visit
Admission: RE | Admit: 2022-08-02 | Discharge: 2022-08-02 | Disposition: A | Discharge status: Home / Self Care | Payer: 59 | Source: Ambulatory Visit | Attending: Obstetrics and Gynecology | Admitting: Obstetrics and Gynecology

## 2022-08-02 ENCOUNTER — Encounter: Payer: Self-pay | Admitting: Obstetrics and Gynecology

## 2022-08-02 ENCOUNTER — Other Ambulatory Visit: Payer: Self-pay | Admitting: Obstetrics and Gynecology

## 2022-08-02 DIAGNOSIS — N920 Excessive and frequent menstruation with regular cycle: Secondary | ICD-10-CM | POA: Insufficient documentation

## 2022-08-02 DIAGNOSIS — Z1231 Encounter for screening mammogram for malignant neoplasm of breast: Secondary | ICD-10-CM | POA: Diagnosis not present

## 2022-08-02 DIAGNOSIS — D500 Iron deficiency anemia secondary to blood loss (chronic): Secondary | ICD-10-CM | POA: Insufficient documentation

## 2022-08-02 DIAGNOSIS — D219 Benign neoplasm of connective and other soft tissue, unspecified: Secondary | ICD-10-CM | POA: Diagnosis not present

## 2022-08-04 ENCOUNTER — Other Ambulatory Visit: Payer: Self-pay | Admitting: Obstetrics and Gynecology

## 2022-08-04 DIAGNOSIS — R928 Other abnormal and inconclusive findings on diagnostic imaging of breast: Secondary | ICD-10-CM

## 2022-08-07 ENCOUNTER — Encounter: Payer: Self-pay | Admitting: Obstetrics and Gynecology

## 2022-08-13 ENCOUNTER — Telehealth: Payer: Self-pay | Admitting: Obstetrics and Gynecology

## 2022-08-13 ENCOUNTER — Other Ambulatory Visit: Payer: Self-pay | Admitting: Obstetrics and Gynecology

## 2022-08-13 DIAGNOSIS — I6310 Cerebral infarction due to embolism of unspecified precerebral artery: Secondary | ICD-10-CM

## 2022-08-13 NOTE — Telephone Encounter (Signed)
GYN Telephone Note D/w her again re: treatment options for the fibroids after review of u/s. I d/w her re: uterine sparing potential options such as IR [place] and potentially Sonata, and I also d/w her re: hysterectomy. I told her about open vs potentially laparoscopic hysterectomy. I told her if I did it I would go via the open route due to her exam in the office, but I can see if laparoscopic may be feasible with one of my partners. She states she would like a hysterectomy and is okay with the longer recovery and complication risk via open route if needed. I told her I would touch base with one of my partners to see if [place] may be possible and if so will set up appointment for office consult.    I spoke to patient and she is not currently having any vaginal bleeding. Will set up for cbc and anemia panel once dispo for OR is figured out to see if she needs IV iron. I told to continue on the maintenance megace 40 bid and if bleeding occurs to increase to 80 bid for the duration of the bleeding  I still don't see an Neurology appointment. Will send another referral.   Patient amenable to plan  Durene Romans MD Attending Center for [facility] (Faculty Practice) 08/13/2022 Time: [id number]

## 2022-08-24 NOTE — Progress Notes (Signed)
TRANSITION OF CARE VISIT   Date of Admission: 07/11/2022  Date of Discharge: 07/12/2022  Transitions of Care Call: 07/13/2022  Discharged from: [facility]   Discharge Diagnosis:  Principal Problem:   Symptomatic anemia Active Problems:   Fibroid uterus   Dyslipidemia   Tobacco use disorder   Acute on chronic blood loss anemia  Summary of Admission per MD note: 46 year old with history of stroke associated with acute COVID-19 infection and currently without neurological deficits, chronic iron deficiency anemia secondary to menorrhagia with fibroid uterus, lacked insurance and unable to follow-up with OB/GYN and for surgery presented with generalized weakness and fatigue for about 2 weeks.  In the emergency room hemodynamically stable.  Hemoglobin 4.2.  Previous hemoglobin 7.8, 6 months ago.  CT scan abdomen pelvis consistent with fibroid uterus but no other acute abnormalities.  Potassium 3.1.   Admitted due to significant symptoms, symptomatic anemia. Hemoglobin 4.2-2 units of PRBC transfusion-appropriate response to 6.1-1 additional PRBC before discharge today.  Recheck hemoglobin in 1 week.  Since patient does not have evidence of ongoing bleeding and she has responded appropriately, no need to recheck today again. Patient does have bleeding fibroids, previously recommended hysterectomy by her OB/GYN.  Patient has now established with healthcare coverage, she is going to schedule follow-up with her OB/GYN. Patient was prescribed progesterone in the past which she could not afford, will prescribe in the meantime. Oral iron therapy prescribed.   Patient does have a history of embolic a stroke in the context of acute COVID-19 infection, she took antiplatelet therapy for about 6 months and is stopped since.  Does not have any evidence of new neurological deficits.  She does not have any contraindication to go for hysterectomy if that  becomes necessary.  Will not resume aspirin given risk of bleeding.   Stable for discharge.   Today's visit 08/30/2022: - [facility] with [facility]. Last appointment 07/28/2022 with Aletha Halim, MD. Iron panel last checked on the same date and instructed to continue iron pills.  - Reports she is aware of appointment 09/05/2022 with Neurology.  - Reports weeks ago she cut her right index finger while working outside in the garden. She did not report to the emergency department or urgent care. Reports instead she cleansed the finger and applied liquid Band-Aid. Since then knuckle of the same finger numb. She is right-handed. Denies additional symptoms.  - Reports anxiety depression began in 2021 after having stroke and progressed since then. More recently her and her 66 year-old son moved in with her brother and his wife. Reports initially things were good and she was helping to care for her brother who was also sick. Currently reports she feels uncomfortable at he brother's home. Reports her brother and his wife are slowly moving items out and not explaining why. Reports she knows her brother and his wife will eventually move and may not tell her. She is unable to save money to move her and her son into their own place due to paying her bills. States she doesn't know what she did to cause her brother and his wife to act this way towards her and her son. Reports her son is stressed because of seeing his mother treated this way. She is employed. Reports recently she was crying at work and her coworker friend was there to help. Denies thoughts of self-harm, suicidal ideation, and homicidal ideations. She is ready to try medication and counseling.  -  Itching rash bilateral neck. No additional symptoms.    Patient/Caregiver self-reported problems/concerns: see above  MEDICATIONS  Medication Reconciliation conducted with patient/caregiver? (Yes/ No):  yes   New medications prescribed/discontinued upon discharge? (Yes/No): yes   Barriers identified related to medications: no  LABS  Lab Reviewed (Yes/No/NA): yes  PHYSICAL EXAM:  Physical Exam HENT:     Head: Normocephalic and atraumatic.  Eyes:     Extraocular Movements: Extraocular movements intact.     Conjunctiva/sclera: Conjunctivae normal.     Pupils: Pupils are equal, round, and reactive to light.  Cardiovascular:     Rate and Rhythm: Normal rate and regular rhythm.     Pulses: Normal pulses.     Heart sounds: Normal heart sounds.  Pulmonary:     Effort: Pulmonary effort is normal.     Breath sounds: Normal breath sounds.  Musculoskeletal:     Right hand: Normal.     Left hand: Normal.     Cervical back: Normal range of motion and neck supple.  Skin:    General: Skin is warm and dry.  Neurological:     General: No focal deficit present.     Mental Status: She is alert and oriented to person, place, and time.  Psychiatric:        Mood and Affect: Affect is tearful.    ASSESSMENT AND PLAN: 1. Hospital discharge follow-up - Reviewed hospital course, current medications, ensured proper follow-up in place, and addressed concerns.   2. Symptomatic anemia 3. Acute on chronic blood loss anemia 4. Menorrhagia with regular cycle 5. Uterine leiomyoma, unspecified location - Keep all scheduled appointments with [facility].  6. History of CVA (cerebrovascular accident) 7. Dyslipidemia - Keep appointment scheduled 09/05/2022 with Antony Contras, MD at Neurology.   8. Tobacco use disorder - Counseled to quit.   9. Anxiety and depression 10. Housing insecurity - Patient denies thoughts of self-harm, suicidal ideations, homicidal ideations. - Sertraline and Hydroxyzine as prescribed. Counseled on medication adherence and adverse effects.  - Referral to Rosana Hoes, LCSWA for counseling/community resources to bridge  establishment with Psychiatry. - Referral to Psychiatry for further evaluation and management. During the interim patient will follow-up with me in 4 weeks or sooner if needed until establishment with Psychiatry.  - sertraline (ZOLOFT) 25 MG tablet; Take 1 tablet (25 mg total) by mouth daily.  Dispense: 30 tablet; Refill: 0 - hydrOXYzine (VISTARIL) 25 MG capsule; Take 1 capsule (25 mg total) by mouth every 8 (eight) hours as needed.  Dispense: 30 capsule; Refill: 1 - Ambulatory referral to Psychiatry  11. Rash - Triamcinolone ointment as prescribed. Counseled on medication adherence/adverse effects. - Follow-up with primary provider as scheduled. - triamcinolone ointment (KENALOG) 0.5 %; Apply 1 Application topically 2 (two) times daily.  Dispense: 30 g; Refill: 1  12. Laceration of right index finger, foreign body presence unspecified, nail damage status unspecified, subsequent encounter 13. Finger numbness - Exam unremarkable.  - Referral to Orthopedic Surgery for further evaluation and management.  - Ambulatory referral to Orthopedic Surgery  PATIENT EDUCATION PROVIDED: See AVS   FOLLOW-UP (Include any further testing or referrals):  - Follow-up with primary provider as scheduled.  - Keep all scheduled appointments with Gynecology.  - Keep all scheduled appointments with Neurology.  - Referral to Rosana Hoes, LCSWA for counseling/community resources.  - Referral to Psychiatry for further evaluation and management.   Patient was given clear instructions to go to  Emergency Department or return to medical center if symptoms don't improve, worsen, or new problems develop.The patient verbalized understanding.

## 2022-08-30 ENCOUNTER — Ambulatory Visit (INDEPENDENT_AMBULATORY_CARE_PROVIDER_SITE_OTHER): Payer: 59 | Admitting: Family

## 2022-08-30 ENCOUNTER — Encounter: Payer: Self-pay | Admitting: Family

## 2022-08-30 ENCOUNTER — Telehealth: Payer: Self-pay | Admitting: Family

## 2022-08-30 VITALS — BP 125/78 | Temp 98.3°F | Resp 16 | Ht 64.37 in | Wt 148.0 lb

## 2022-08-30 DIAGNOSIS — D259 Leiomyoma of uterus, unspecified: Secondary | ICD-10-CM | POA: Diagnosis not present

## 2022-08-30 DIAGNOSIS — D62 Acute posthemorrhagic anemia: Secondary | ICD-10-CM | POA: Diagnosis not present

## 2022-08-30 DIAGNOSIS — D649 Anemia, unspecified: Secondary | ICD-10-CM

## 2022-08-30 DIAGNOSIS — S61210D Laceration without foreign body of right index finger without damage to nail, subsequent encounter: Secondary | ICD-10-CM

## 2022-08-30 DIAGNOSIS — Z09 Encounter for follow-up examination after completed treatment for conditions other than malignant neoplasm: Secondary | ICD-10-CM | POA: Diagnosis not present

## 2022-08-30 DIAGNOSIS — N920 Excessive and frequent menstruation with regular cycle: Secondary | ICD-10-CM

## 2022-08-30 DIAGNOSIS — R21 Rash and other nonspecific skin eruption: Secondary | ICD-10-CM

## 2022-08-30 DIAGNOSIS — Z8673 Personal history of transient ischemic attack (TIA), and cerebral infarction without residual deficits: Secondary | ICD-10-CM

## 2022-08-30 DIAGNOSIS — F419 Anxiety disorder, unspecified: Secondary | ICD-10-CM

## 2022-08-30 DIAGNOSIS — R200 Anesthesia of skin: Secondary | ICD-10-CM

## 2022-08-30 DIAGNOSIS — Z59819 Housing instability, housed unspecified: Secondary | ICD-10-CM

## 2022-08-30 DIAGNOSIS — E785 Hyperlipidemia, unspecified: Secondary | ICD-10-CM

## 2022-08-30 DIAGNOSIS — F32A Depression, unspecified: Secondary | ICD-10-CM

## 2022-08-30 DIAGNOSIS — F17200 Nicotine dependence, unspecified, uncomplicated: Secondary | ICD-10-CM

## 2022-08-30 DIAGNOSIS — R69 Illness, unspecified: Secondary | ICD-10-CM | POA: Diagnosis not present

## 2022-08-30 MED ORDER — TRIAMCINOLONE ACETONIDE 0.5 % EX OINT: 1.0000 | TOPICAL_OINTMENT | Freq: Two times a day (BID) | CUTANEOUS | 1 refills | Status: DC

## 2022-08-30 MED ORDER — HYDROXYZINE PAMOATE 25 MG PO CAPS: 25.0000 mg | ORAL_CAPSULE | Freq: Three times a day (TID) | ORAL | 1 refills | Status: DC | PRN

## 2022-08-30 MED ORDER — SERTRALINE HCL 25 MG PO TABS: 25.0000 mg | ORAL_TABLET | Freq: Every day | ORAL | 0 refills | Status: DC

## 2022-08-30 NOTE — Progress Notes (Signed)
Pt presents for transition of care  -hospital visit in Sept. -

## 2022-09-01 NOTE — Telephone Encounter (Signed)
LCSWA called patient today to introduce herself and to assess patients' mental health needs. Patient did not answer the phone. LCSWA was able to leave a brief message with the patient asking them to return the call. Patient was referred by PCP for anxiety, depression, housing insecurity. Please share resources if you speak to patient.   Advocacy/Legal Legal Aid [place]:  [phone]  /  [phone] /  LVM, taking clients  [place]:  [phone] /  Onsite, counseling with Avie Echevaria is virtual, Accepting new clients   Family Service of the [employer] 24-hr Crisis line:  [phone] Virtual & Onsite services (Client preference), Accepting New clients  [employer], [place]:  [phone] Virtual & Onsite services (Client preference), Accepting New clients  Court Watch (custody):  [phone] Virtual, Accepting new clients  [facility]:   [phone] Virtual/Telephone, accepting clients for waitlisting (time depends on services)   Baby & Breastfeeding [place] [facility]actation [phone] Outpatient consultant out for weeks (will be hard to get an appointment) , Support group offered Virtually (Accepting new members)  [facility] Lactation [phone] Telephone & Onsite services (Client preference), Accepting New clients  [place]: [phone] ([place]);  [phone] ([place]) Virtual  [place] League:  [phone]     Childcare [place] Child Development: [phone] ([place]) / [phone] ([place])             - Child Care Resources/ Referrals/ Scholarships             - Head Start/ Early Head Start (call or apply online) Virtually (by appointment), Accepting new families  [place] DHHS: [place] Pre-K :  [phone] / [phone]     Employment / [place]: [phone] / [place] services (Client preference), Accepting New clients  [place] [facility] [place] ([place]): [phone] ([place]) / [phone] ([place]) Virtual &  Onsite workshops, Accepting new clients  [place] Resource/ [place]: [phone] / [phone] Virtual & Onsite , Accepting New clients  [place]: [phone]   DHHS Work First: [phone] ([place]) / [phone] ([place]) Virtually, Accepting clients   [place]:  [phone]  Virtual and Onsite, Accepting new clients     [place]:  [phone] Virtual (financial assistance) & Onsite (all other services), Accepting new families  [employer]: [phone] [employer] Network (furniture):  [place] Helping Hands: [phone]   [place]: [place], accepting new families    [place]- SNAP/ Food Stamps: 5343597308 Virtual  WIC: [phone] ;  HP [phone] Virtual        During the summer, text "FOOD" to [place] / Clinics (Adults) [place] (for Adults) through [facility]: [phone]    [place]:   [place]:   [phone]   Health Department:  [place]:  [phone] / [phone]   Planned Parenthood of [place]:   [phone] Onsite, Accepting new patients  [facility]:   [phone] Onsite , Accepting new patients    [place]:   [place]:  Reserve:  [place] for [facility] [place]):  [phone] Onsite, Accepting new people  [employer] [facility]:  [phone] Virtual, accepting new  individuals  [place]:  [place]:  [phone] Virtual, Accepting new clients  African Services Coalition:   [place].org  Virtual, Accepting new members  PFLAG  6468247045 / info'@[place]'$ .org  Virtual, Accepting new Members  [facility]:  [phone]  Virtual    Mental Health/ Substance Use Family Service of the [facility]  [phone] [address] [place] [address] Virtual & Onsite services (Client preference), Accepting New clients  [place]:  [phone] or [phone] [place], [place] [address] Onsite & Virtual, Accepting new clients  [facility]:  [phone] [address] [place], [place] [address] Virtual & Onsite, Accepting new clients  [facility]:  [phone] Margarita Sermons [place], [place]) [address] [place], [place] [address] Onsite & Virtual, Accepting new clients  [place] (walk-ins)  [phone] / [place]:  [phone] Virtual meetings via [place]- need meeting passcode- call [phone] to receive code "AFG"= Al-Anon Family Group  [email or web address]   Alcoholics Anonymous:  917-915-0569 LacrosseR[place]by.dk  Narcotics Anonymous:  794-801-6553 74 hour helpline: 302-421-2789  Quit Smoking Hotline:  800-QUIT-NOW 2394485311)    [facility]                                              [phone] [place], [place], [place] [address] Onsite & Virtual, Accepting new clients    COUNSELING AGENCIES in [place] (Accepting Medicaid)   [place]  (* = Spanish available;  + = Psychiatric services) * Family Service of the [place] [address] [place], [place] [address] Virtual & Onsite services (Client preference), Accepting [place]:                                        [phone] or [phone] Virtual & Onsite, Accepting clients  +[facility] Total Access Care                                [phone]    [facility]:                                                 [phone]   + Pass Christian:                                           [phone] Onsite & Virtual, Accepting new clients  [place]                               [phone] Onsite, Accepting new clients  * Family  Solutions:                                                     [phone] Virtual, NOT accepting new clients  The Social Emotional Learning (SEL) Group           [phone] Virtual, accepting new clients  Youth Focus:                                                            [place], Accepting new clients  * [facility]:                                        [place], [place] 6-8 months for services  [place]:                             [place]                                                [phone] Onsite & Virtual, Accepting new clients  + [facility] and [place]:             9806849106 or [place]                                                    [phone] Onsite & Virtual, Accepting new clients  *+ [place] Sessions (walk-ins)                                                [phone] / [place]   My [place]                                              [phone] Onsite & Virtual, Accepting new clients  [employer]limited (PCIT)                                              [phone] Onsite & Virtual, At Capacity (check in occasionally , subject to change)    Substance Use [place]:                                [phone]  Alcoholics Anonymous:  579-620-3208  Narcotics Anonymous:       430-576-7930  Quit Smoking Hotline:         800-QUIT-NOW 604 041 9918[place] [facility]([phone])417-6331 Provides information on mental health, intellectual/developmental disabilities & substance abuse  services in [place]:  3653362483 Virtual , Accepting families  YWCA: [phone]   [employer]: Bringing Out the Best:  [place] at Three (Hispanic families): [phone] Onsite, Accepting new children ( short wait list)  Healthy Start ([place]):  [phone] [phone]   Parents as Teachers:  [phone] Virtually, accepting families ( waitlist for Spanish speaking families )  [place] Child Psychologist, counselling- Learning Together (Immigrants): ([phone]     Poison Control [phone]   Sports & Recreation [employer] Open Doors Application: ImDemand.es Onsite, Accepting new families  [place] of [place]: http://www.[place].gov/index.aspx?page=3615 Onsite    Special Needs Family Support Network:  763-884-0815 Virtual, Accepting new families  Autism Society of [place]:   [place] or 7707405582 /  [phone] Virtual, Accepting new families   [facility]:  [phone] Virtual, Accepting families  ARC of [place]:  [phone] Virtual, Accepting new families  [place] (CDSA):  ([phone] Virtual, Accepting new families  [place] (Care Coordination for Children):  ([phone] Virtual, accepting new patients     Transportation Medicaid Transportation: 862-356-5952 to apply  [place]: [phone] (reduced-fare bus ID to [place])  SCAT Paratransit services: Eligible riders only, call [phone] for application    Tutoring/ [place]: [phone] No tutoring only afterschool programming (In Person), Accepting new students  Big Brothers/ [place]: [phone] Letta Kocher)  [phone] ([place])   ACES through child's school: [place]: contact your local Y In Person, Accepting New students  SHIELD Mentor Program:  [phone] Will re-launch in the fall   Updated 02/2020  Housing Resources  [place]: Affordable housing database. Visit the website below to search for housing in your  budget and location parameters  MeatSub.co.za  701-874-2519  [place]: Property management for several affordable housing  communities in the area. Contact individual properties for availability.  http://www.InsuranceTransaction.co.za  [place]: assists with homelessness prevention, foreclosure prevention, healthy  homes (assistance to residents who live in homes with health and safety hazards through education,  referrals, and tenant advocacy)  Mon - Fri 8:30 am - 4:30 pm  [place] [facility]): day center for people experiencing homelessness; offers resources  including showers, laundry, phones, mailroom, computer lab, medical clinic, bike maintenance, case  managers.  [place], [place] [address]  [place] Knock - Fri 8:00 am - 3:00 pm   [place]: [employer] (PBV) waiting list specifically for (73 years of age  and older) Near-Elderly and Elderly Only is now open. Housing Choice Voucher (Section 8) waiting list  remains closed. Apply online.  [place], [place] [address]  ([phone]  http://www.gha-[place].org/  [place]: Has Section 8 and project-based voucher waiting lists open. Complete the  application online.  [address] [place], [place] [address]  [phone]  GulfSpecialist.pl

## 2022-09-05 ENCOUNTER — Inpatient Hospital Stay: Payer: 59 | Admitting: Neurology

## 2022-09-06 ENCOUNTER — Other Ambulatory Visit: Payer: 59

## 2022-09-06 ENCOUNTER — Telehealth: Payer: Self-pay | Admitting: General Practice

## 2022-09-06 NOTE — Telephone Encounter (Signed)
Called patient and informed her that Dr Ilda Basset sent a message to our front office to schedule her with our new minimally invasive GYN surgeon. Also discussed importance of neurology clearance prior to surgery. Patient verbalized understanding and asked if it was normal to spot or miss her periods. Told patient that can happen with the Megace prescription. Patient verbalized understanding.

## 2022-09-12 ENCOUNTER — Encounter: Payer: Self-pay | Admitting: Family

## 2022-09-12 ENCOUNTER — Encounter: Payer: Self-pay | Admitting: Obstetrics and Gynecology

## 2022-09-12 ENCOUNTER — Encounter: Payer: 59 | Admitting: Family

## 2022-09-12 NOTE — Progress Notes (Signed)
Erroneous encounter-disregard

## 2022-09-13 ENCOUNTER — Ambulatory Visit: Payer: 59 | Admitting: Orthopaedic Surgery

## 2022-09-20 ENCOUNTER — Encounter: Payer: Self-pay | Admitting: Obstetrics and Gynecology

## 2022-09-23 ENCOUNTER — Emergency Department (HOSPITAL_COMMUNITY)
Admission: EM | Admit: 2022-09-23 | Discharge: 2022-09-23 | Disposition: A | Discharge status: Home / Self Care | Payer: 59 | Attending: Emergency Medicine | Admitting: Emergency Medicine

## 2022-09-23 ENCOUNTER — Other Ambulatory Visit: Payer: Self-pay

## 2022-09-23 ENCOUNTER — Encounter (HOSPITAL_COMMUNITY): Payer: Self-pay

## 2022-09-23 DIAGNOSIS — N938 Other specified abnormal uterine and vaginal bleeding: Secondary | ICD-10-CM | POA: Insufficient documentation

## 2022-09-23 DIAGNOSIS — Z91040 Latex allergy status: Secondary | ICD-10-CM | POA: Insufficient documentation

## 2022-09-23 DIAGNOSIS — R42 Dizziness and giddiness: Secondary | ICD-10-CM | POA: Diagnosis not present

## 2022-09-23 DIAGNOSIS — N939 Abnormal uterine and vaginal bleeding, unspecified: Secondary | ICD-10-CM

## 2022-09-23 LAB — I-STAT CHEM 8, ED
BUN: 10 mg/dL (ref 6–20)
Calcium, Ion: 1.23 mmol/L (ref 1.15–1.40)
Chloride: 107 mmol/L (ref 98–111)
Creatinine, Ser: 0.9 mg/dL (ref 0.44–1.00)
Glucose, Bld: 97 mg/dL (ref 70–99)
HCT: 37 % (ref 36.0–46.0)
Hemoglobin: 12.6 g/dL (ref 12.0–15.0)
Potassium: 3.3 mmol/L — ABNORMAL LOW (ref 3.5–5.1)
Sodium: 142 mmol/L (ref 135–145)
TCO2: 24 mmol/L (ref 22–32)

## 2022-09-23 LAB — BASIC METABOLIC PANEL
Anion gap: 8 (ref 5–15)
BUN: 12 mg/dL (ref 6–20)
CO2: 23 mmol/L (ref 22–32)
Calcium: 9.3 mg/dL (ref 8.9–10.3)
Chloride: 109 mmol/L (ref 98–111)
Creatinine, Ser: 0.93 mg/dL (ref 0.44–1.00)
GFR, Estimated: >60 mL/min (ref >60)
Glucose, Bld: 100 mg/dL — ABNORMAL HIGH (ref 70–99)
Potassium: 3.3 mmol/L — ABNORMAL LOW (ref 3.5–5.1)
Sodium: 140 mmol/L (ref 135–145)

## 2022-09-23 LAB — CBC WITH DIFFERENTIAL/PLATELET
Abs Immature Granulocytes: 0.01 10*3/uL (ref 0.00–0.07)
Basophils Absolute: 0 10*3/uL (ref 0.0–0.1)
Basophils Relative: 0 %
Eosinophils Absolute: 0.4 10*3/uL (ref 0.0–0.5)
Eosinophils Relative: 7 %
HCT: 37.3 % (ref 36.0–46.0)
Hemoglobin: 11.8 g/dL — ABNORMAL LOW (ref 12.0–15.0)
Immature Granulocytes: 0 %
Lymphocytes Relative: 29 %
Lymphs Abs: 1.6 10*3/uL (ref 0.7–4.0)
MCH: 29.4 pg (ref 26.0–34.0)
MCHC: 31.6 g/dL (ref 30.0–36.0)
MCV: 93 fL (ref 80.0–100.0)
Monocytes Absolute: 0.3 10*3/uL (ref 0.1–1.0)
Monocytes Relative: 5 %
Neutro Abs: 3.2 10*3/uL (ref 1.7–7.7)
Neutrophils Relative %: 59 %
Platelets: 318 10*3/uL (ref 150–400)
RBC: 4.01 MIL/uL (ref 3.87–5.11)
RDW: 14.4 % (ref 11.5–15.5)
WBC: 5.4 10*3/uL (ref 4.0–10.5)
nRBC: 0 % (ref 0.0–0.2)

## 2022-09-23 LAB — TYPE AND SCREEN
ABO/RH(D): O POS
Antibody Screen: NEGATIVE

## 2022-09-23 LAB — HCG, QUANTITATIVE, PREGNANCY: hCG, Beta Chain, Quant, S: <1 m[IU]/mL (ref <5)

## 2022-09-23 NOTE — ED Triage Notes (Signed)
Patient said she has had vaginal bleeding since October 26th. Has fibroids. Today said she had to leave work early because she is having pelvic cramping and heavier bleeding. Has gone through 10 pads a day. Stated she is lightheaded and dizzy. Feeling like she is going to pass out.

## 2022-09-23 NOTE — ED Provider Triage Note (Signed)
Emergency Medicine Provider Triage Evaluation Note  Wendy Ross , a 45 y.o. female  was evaluated in triage.  Pt complains of vaginal bleeding.  Patient states that she has had light bleeding since 09/01/2022 when her period was supposed to begin.  She states that then since 09/17/2022 she has had significant bleeding.  States that she has been using Megace that was previously provided to her to slow down her bleeding however it has not helped.  She is having lightheadedness and shortness of breath.  States that she has had transfusions in the past for this.  Is having lower abdominal cramping.  States that she is using about 10 pads a day with tampons..  Review of Systems  Positive: See above Negative:   Physical Exam  BP (!) 113/97 (BP Location: Left Arm)   Pulse 66   Temp 98.7 F (37.1 C) (Oral)   Resp 18   SpO2 99%  Gen:   Awake, moderate distress on exam Resp:  Normal effort  MSK:   Moves extremities without difficulty  Other:  Abdomen is rounded, soft, mildly tender to palpation.  GU exam deferred in triage.  Medical Decision Making  Medically screening exam initiated at 12:57 PM.  Appropriate orders placed.  Wendy Ross was informed that the remainder of the evaluation will be completed by another provider, this initial triage assessment does not replace that evaluation, and the importance of remaining in the ED until their evaluation is complete.  Ordering blood counts to evaluate for critical anemia.   Mickie Hillier, PA-C 09/23/22 1259

## 2022-09-23 NOTE — Discharge Instructions (Signed)
Return for any problem.  ?

## 2022-09-23 NOTE — ED Provider Notes (Signed)
[facility] DEPT Provider Note   CSN: [id number] Arrival date & time: 09/23/22  1226     History  Chief Complaint  Patient presents with   Vaginal Bleeding    Wendy Ross is a 46 y.o. female.  46 year old female with prior medical history as detailed below presents for evaluation.  Patient with known history of fibroids and heavy vaginal bleeding.  Patient reports that she had light bleeding starting on October 26.  Apparently her bleeding increased on November 11.  Patient reports that she has been going through approximately 10 pads per day.  She complains of feeling lightheaded and weak.  This lightheadedness began today.  She is concerned that she may need another transfusion.  She has had transfusions before secondary to chronic blood loss related to her heavy menses.  Patient is followed by Dr. Ilda Basset with OB/GYN.  The history is provided by the patient and medical records.       Home Medications Prior to Admission medications   Medication Sig Start Date End Date Taking? Authorizing Provider  ferrous sulfate 325 (65 FE) MG tablet Take 1 tablet (325 mg total) by mouth 2 (two) times daily with a meal. 07/12/22 08/11/22  Barb Merino, MD  hydrOXYzine (VISTARIL) 25 MG capsule Take 1 capsule (25 mg total) by mouth every 8 (eight) hours as needed. 08/30/22   Camillia Herter, NP  megestrol (MEGACE) 40 MG tablet Take 1 tablet (40 mg total) by mouth 2 (two) times daily. 07/28/22   Aletha Halim, MD  sertraline (ZOLOFT) 25 MG tablet Take 1 tablet (25 mg total) by mouth daily. 08/30/22   Camillia Herter, NP  triamcinolone ointment (KENALOG) 0.5 % Apply 1 Application topically 2 (two) times daily. 08/30/22   Camillia Herter, NP      Allergies    Latex and Percocet [oxycodone-acetaminophen]    Review of Systems   Review of Systems  All other systems reviewed and are negative.   Physical Exam Updated Vital Signs BP (!) 113/97 (BP Location:  Left Arm)   Pulse 66   Temp 98.7 F (37.1 C) (Oral)   Resp 18   SpO2 99%  Physical Exam Vitals and nursing note reviewed.  Constitutional:      General: She is not in acute distress.    Appearance: Normal appearance. She is well-developed.  HENT:     Head: Normocephalic and atraumatic.  Eyes:     Conjunctiva/sclera: Conjunctivae normal.     Pupils: Pupils are equal, round, and reactive to light.  Cardiovascular:     Rate and Rhythm: Normal rate and regular rhythm.     Heart sounds: Normal heart sounds.  Pulmonary:     Effort: Pulmonary effort is normal. No respiratory distress.     Breath sounds: Normal breath sounds.  Abdominal:     General: There is no distension.     Palpations: Abdomen is soft.     Tenderness: There is no abdominal tenderness.  Musculoskeletal:        General: No deformity. Normal range of motion.     Cervical back: Normal range of motion and neck supple.  Skin:    General: Skin is warm and dry.  Neurological:     General: No focal deficit present.     Mental Status: She is alert and oriented to person, place, and time.     ED Results / Procedures / Treatments   Labs (all labs ordered are listed, but only  abnormal results are displayed) Labs Reviewed  CBC WITH DIFFERENTIAL/PLATELET  BASIC METABOLIC PANEL  HCG, QUANTITATIVE, PREGNANCY  TYPE AND SCREEN    EKG None  Radiology No results found.  Procedures Procedures    Medications Ordered in ED Medications - No data to display  ED Course/ Medical Decision Making/ A&P                           Medical Decision Making Amount and/or Complexity of Data Reviewed Labs: ordered.    Medical Screen Complete  This patient presented to the ED with complaint of lightheaded, vaginal bleeding, history of fibroids.  This complaint involves an extensive number of treatment options. The initial differential diagnosis includes, but is not limited to, symptomatic anemia, etc.  This  presentation is: Acute, Chronic, Self-Limited, Previously Undiagnosed, Uncertain Prognosis, Complicated, Systemic Symptoms, and Threat to Life/Bodily Function  Patient with known history of dysfunctional uterine bleeding and longstanding history of fibroids.  Patient is previously known to Dr. Ilda Basset.  Patient has been taking Megace and iron.  Patient with prior history of anemia requiring transfusion.  Patient is presenting with complaint of lightheadedness that apparently began today after heavier bleeding started on November 11.  Labs obtained today are without significant abnormality specifically patient's hemoglobin is 11.8.  Patient has established follow-up with Dr. Ilda Basset office next week.    Patient is reassured by ED work-up.  She now desires discharge home.  She does understand need for close outpatient follow-up.  Strict return precautions given and understood. Additional history obtained:  External records from outside sources obtained and reviewed including prior ED visits and prior Inpatient records.    Lab Tests:  I ordered and personally interpreted labs.  The pertinent results include: CBC, BMP, i-STAT Chem-8, hCG   Problem List / ED Course:  Dysfunctional uterine bleeding   Reevaluation:  After the interventions noted above, I reevaluated the patient and found that they have: stayed the same   Disposition:  After consideration of the diagnostic results and the patients response to treatment, I feel that the patent would benefit from close outpatient follow-up.          Final Clinical Impression(s) / ED Diagnoses Final diagnoses:  Vaginal bleeding    Rx / DC Orders ED Discharge Orders     None         Valarie Merino, MD 09/23/22 1454

## 2022-09-27 ENCOUNTER — Encounter (HOSPITAL_COMMUNITY): Payer: Self-pay

## 2022-09-27 ENCOUNTER — Encounter (HOSPITAL_COMMUNITY): Payer: 59 | Admitting: Psychiatry

## 2022-09-27 NOTE — Progress Notes (Signed)
This encounter was created in error - please disregard.

## 2022-10-04 ENCOUNTER — Encounter: Payer: Self-pay | Admitting: Neurology

## 2022-10-04 ENCOUNTER — Inpatient Hospital Stay: Payer: 59 | Admitting: Neurology

## 2022-10-11 ENCOUNTER — Ambulatory Visit (INDEPENDENT_AMBULATORY_CARE_PROVIDER_SITE_OTHER): Payer: 59 | Admitting: Obstetrics and Gynecology

## 2022-10-11 ENCOUNTER — Encounter: Payer: Self-pay | Admitting: Obstetrics and Gynecology

## 2022-10-11 VITALS — BP 124/76 | HR 87 | Ht 64.0 in | Wt 157.1 lb

## 2022-10-11 DIAGNOSIS — N939 Abnormal uterine and vaginal bleeding, unspecified: Secondary | ICD-10-CM

## 2022-10-11 NOTE — Patient Instructions (Signed)
Follow up with your neurologist.

## 2022-10-11 NOTE — Progress Notes (Unsigned)
GYNECOLOGY VISIT  Patient name: Wendy Ross MRN [id number]  Date of birth: 07/11/1976 Chief Complaint:   CONSULTATION    History:  Wendy Ross is a 46 y.o. [phone] being seen today for AUB-L.    Menses are typically just 7 days, just very heavy. On megace having prolonged bleeding and stopped  Finished the medication last week. Started taking it twice a day about 1 month ago and had just had a menses but this menses was much longer than her usual menses and also very heavy. She would like to proceed with hysterectomy.   Has a hx of stroke that occurred while she was sick with covid. She was told that she had a whole in her heart and that she had a clot in her leg that went up through her heart and up to her brain. Missed her last neurology appointment and needs to make a new one.   Past Medical History:  Diagnosis Date   Asthma    COVID-19 11/2020   Hypercholesteremia    Stroke [facility])    Vaginal Pap smear, abnormal     Past Surgical History:  Procedure Laterality Date   ENDOMETRIAL BIOPSY  08/01/2022   TUBAL LIGATION     WISDOM TOOTH EXTRACTION      The following portions of the patient's history were reviewed and updated as appropriate: allergies, current medications, past family history, past medical history, past social history, past surgical history and problem list.   Health Maintenance:   Last pap 02/2021. Results were: NILM w/ HRHPV negative. H/O abnormal pap: no Last mammogram: 07/2022. Results were: 0 - incomplete, recommend diagnostic mammo. Family h/o breast cancer: no   Review of Systems:  Pertinent items are noted in HPI. Comprehensive review of systems was otherwise negative.   Objective:  Physical Exam BP 124/76   Pulse 87   Ht '5\' 4"'$  (1.626 m)   Wt 157 lb 1.6 oz (71.3 kg)   BMI 26.97 kg/m    Physical Exam Vitals and nursing note reviewed.  Constitutional:      Appearance: Normal appearance.  HENT:     Head: Normocephalic and atraumatic.   Cardiovascular:     Rate and Rhythm: Normal rate and regular rhythm.  Pulmonary:     Effort: Pulmonary effort is normal.     Breath sounds: Normal breath sounds.  Abdominal:     Comments: Enlarged uterus, mild tenderness to palpation   Skin:    General: Skin is warm and dry.  Neurological:     General: No focal deficit present.     Mental Status: She is alert.  Psychiatric:        Mood and Affect: Mood normal.        Behavior: Behavior normal.        Thought Content: Thought content normal.        Judgment: Judgment normal.    FINAL MICROSCOPIC DIAGNOSIS:   A. ENDOMETRIUM, BIOPSY:  Benign early secretory phase endometrium, postovulatory day 3  Negative for polyp, hyperplasia and carcinoma   Labs and Imaging No results found.     Assessment & Plan:   1. Abnormal uterine bleeding (AUB) Switch to daily iron. She is not sure she wants to continue megace due to prolonged menses but will to continue. 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 Pt with normal echo and noted to have shunt on transcranial doppler with bubbles per neuro note - pt needs  to follow up neuro. Perioperative risk to drive mode of procedure (open vs robotic) and whether or not Tburg well tolerated and if prolonged operative time is contraindicated and if postprocedure anticoagulation indicated. Follow up after neuro appt    Routine preventative health maintenance measures emphasized.  Darliss Cheney, MD [facility]

## 2022-10-14 ENCOUNTER — Ambulatory Visit
Admission: RE | Admit: 2022-10-14 | Discharge: 2022-10-14 | Disposition: A | Discharge status: Home / Self Care | Payer: 59 | Source: Ambulatory Visit | Attending: Obstetrics and Gynecology | Admitting: Obstetrics and Gynecology

## 2022-10-14 DIAGNOSIS — R928 Other abnormal and inconclusive findings on diagnostic imaging of breast: Secondary | ICD-10-CM

## 2022-10-14 DIAGNOSIS — N6489 Other specified disorders of breast: Secondary | ICD-10-CM | POA: Diagnosis not present

## 2022-10-18 ENCOUNTER — Inpatient Hospital Stay: Payer: 59 | Admitting: Neurology

## 2022-11-22 ENCOUNTER — Ambulatory Visit: Payer: 59 | Admitting: Family Medicine

## 2022-12-26 IMAGING — MR MR MRA HEAD W/O CM
1 series · 20 of 48 positions shown · non-contrast
Comparison: MRI of the brain November 25, 2020

CLINICAL DATA: Stroke follow-up

EXAM:
MRA HEAD WITHOUT CONTRAST
TECHNIQUE: Angiographic images of the Circle of Willis were obtained using MRA
technique without intravenous contrast.

[Series 3: (id) mt fs · axial · 1.4mm · 0.43mm/px · z∈[-53,+37]mm · 20 of 136 slices shown]
[im 1/136]
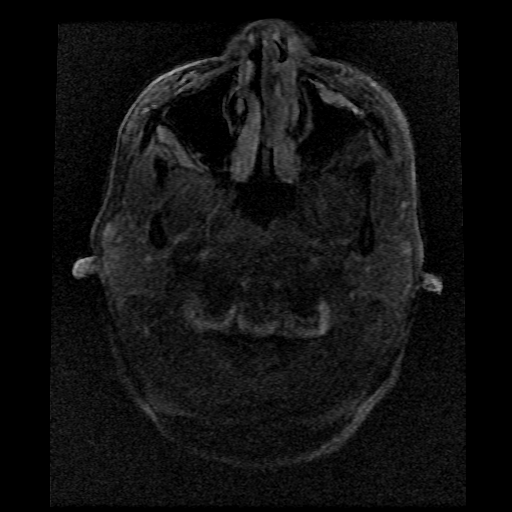
[im 3/136]
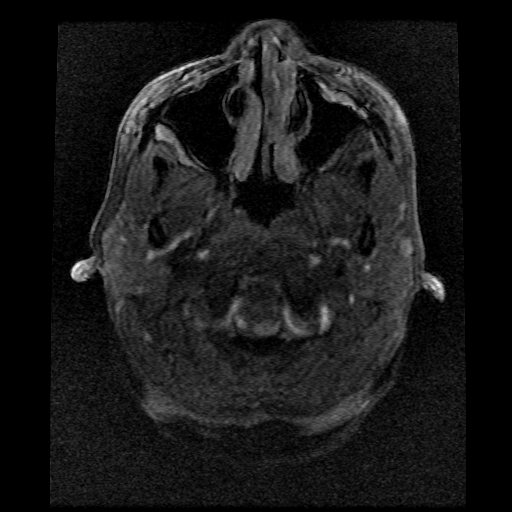
[im 6/136]
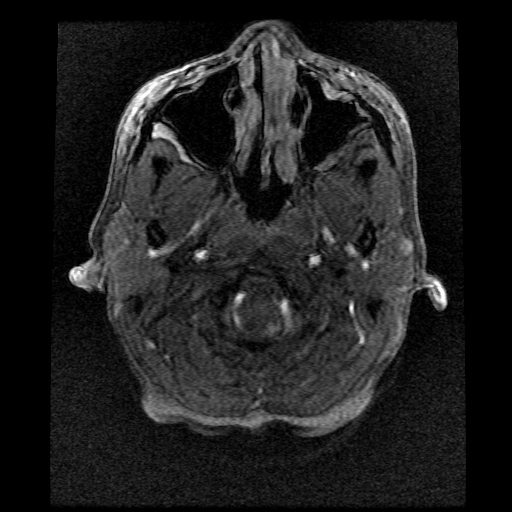
[im 9/136]
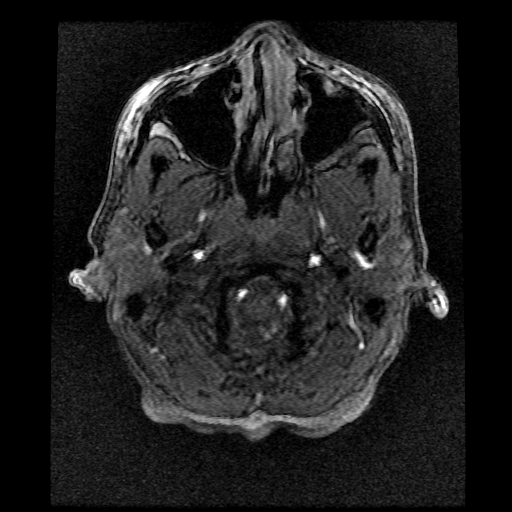
[im 12/136]
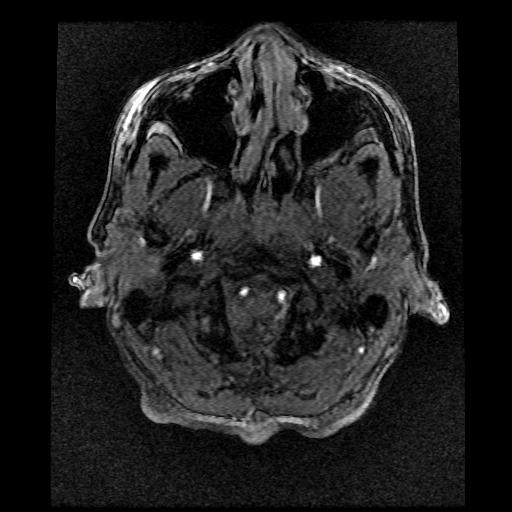
[im 15/136]
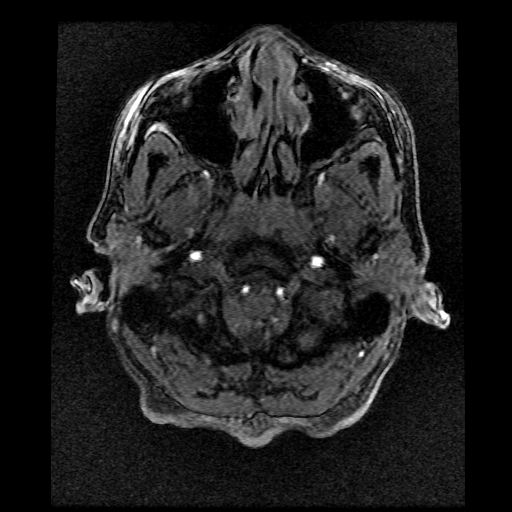
[im 18/136]
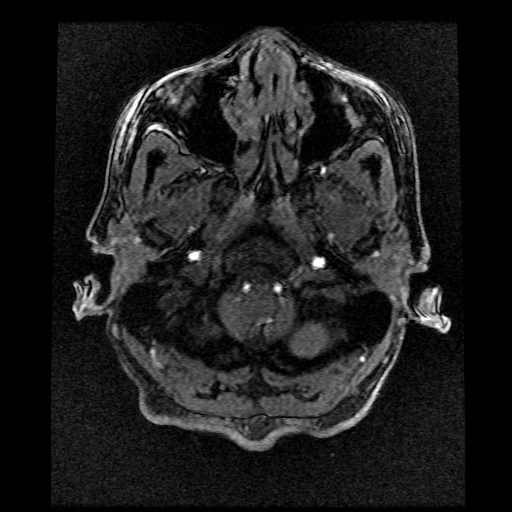
[im 21/136]
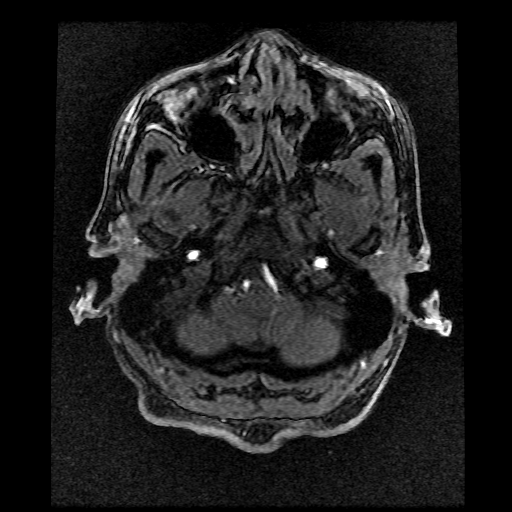
[im 23/136]
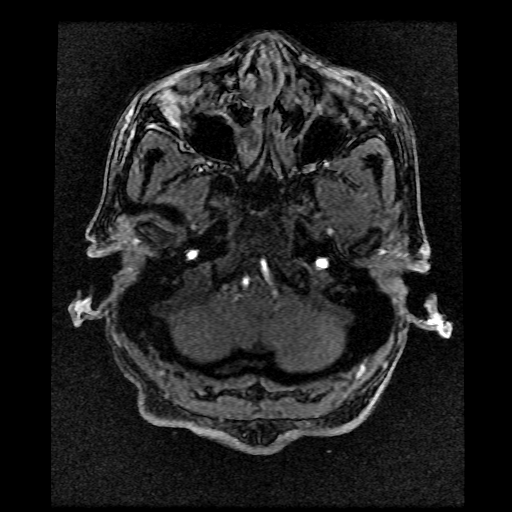
[im 26/136]
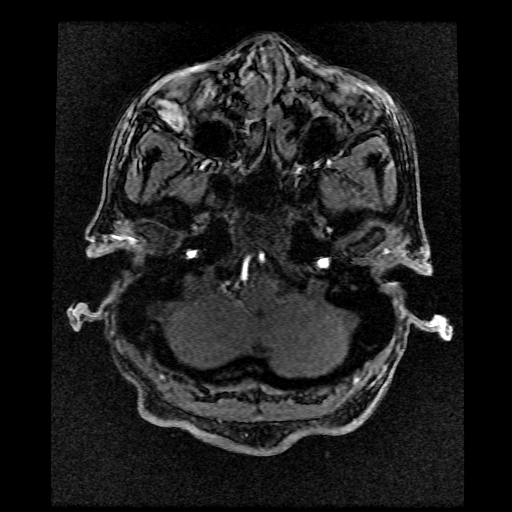
[im 29/136]
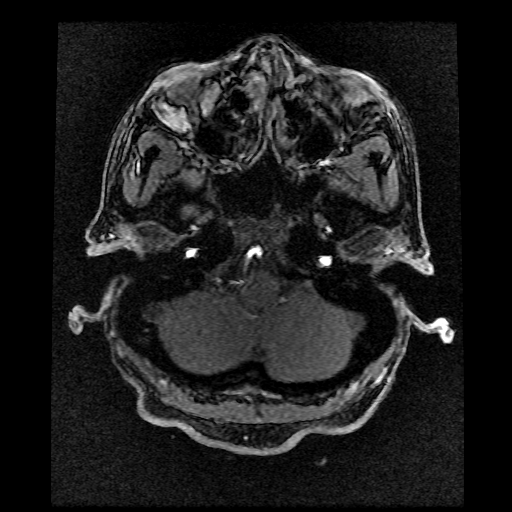
[im 32/136]
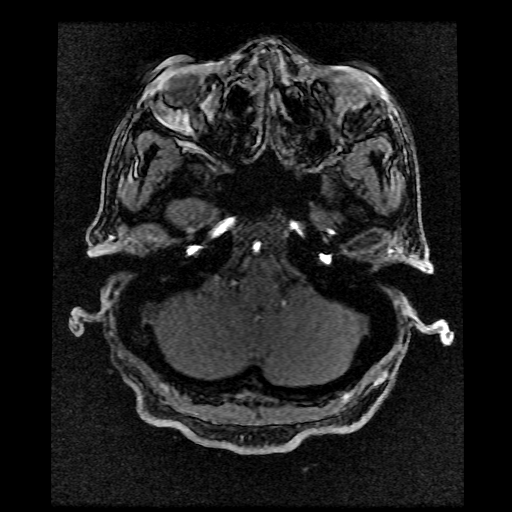
[im 44/136]
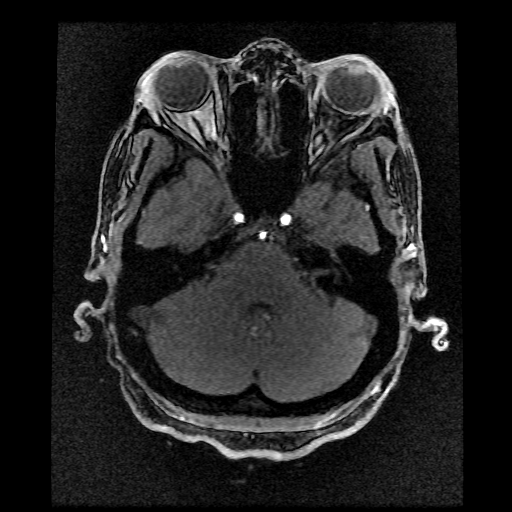
[im 61/136]
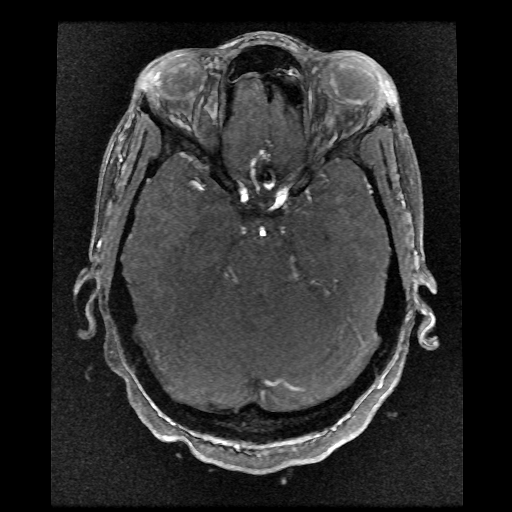
[im 69/136]
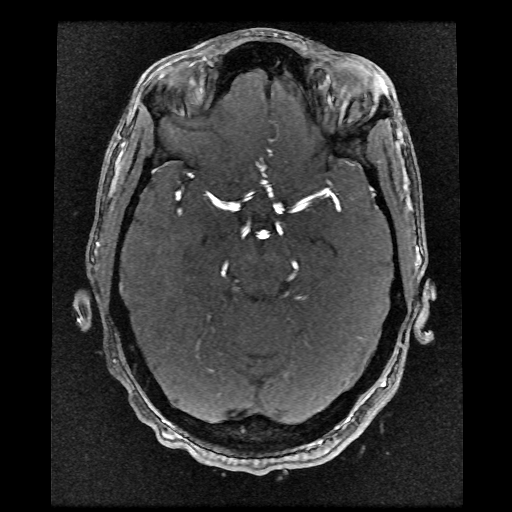
[im 78/136]
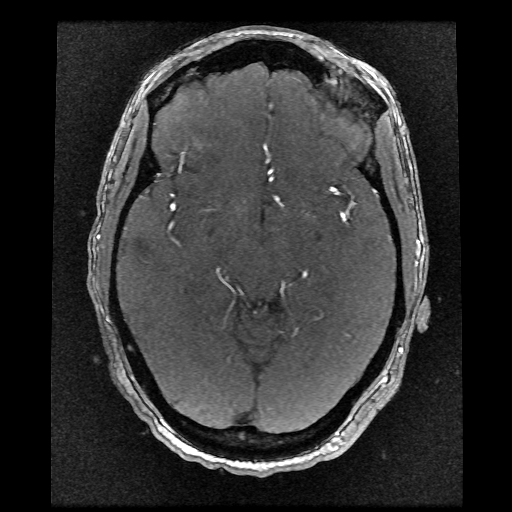
[im 95/136]
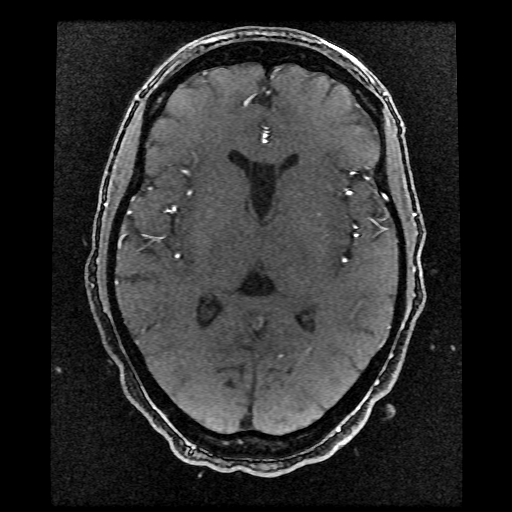
[im 113/136]
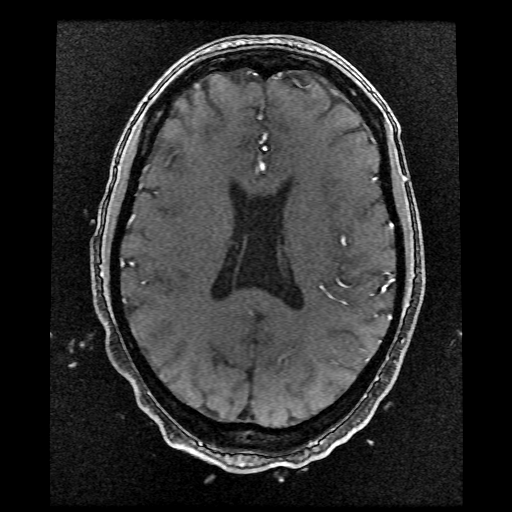
[im 115/136]
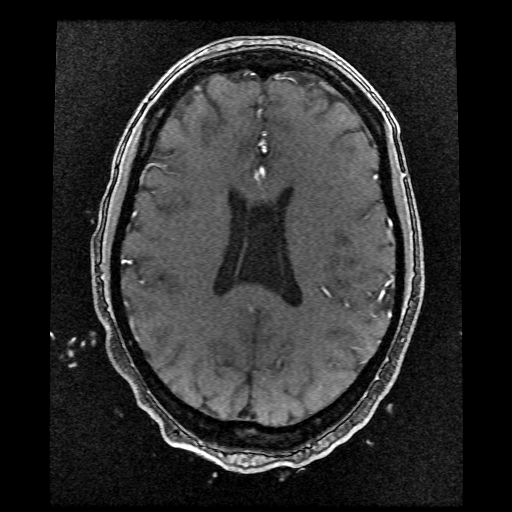
[im 130/136]
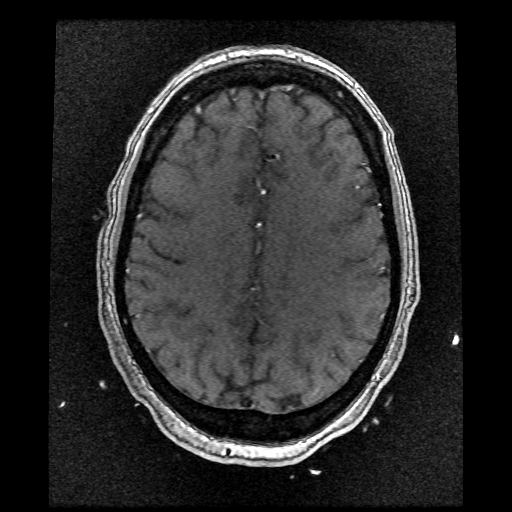

[20 of 48 positions shown; findings below may reference images not displayed]

FINDINGS: The study is significantly degraded by motion.

Normal flow related enhancement is seen in the upper cervical and
intracranial internal carotid arteries, bilateral ACA and MCA
vascular trees without evidence of high-grade stenosis or occlusion.

Normal flow related enhancement of the intracranial vertebral
arteries, basilar artery and bilateral posterior cerebral arteries
without evidence of high-grade stenosis or proximal occlusion.

No intracranial aneurysm identified although a small aneurysm could
be missed due to motion related artifact.
IMPRESSION: Significantly motion degraded study. No evidence of high-grade
stenosis, proximal occlusion, or intracranial aneurysm.

## 2022-12-27 IMAGING — DX DG CHEST 1V PORT
1 series · 1 of 1 positions shown · non-contrast
Comparison: Radiograph 04/25/2013

CLINICAL DATA: Chest pain

EXAM:
PORTABLE CHEST 1 VIEW

[chest]
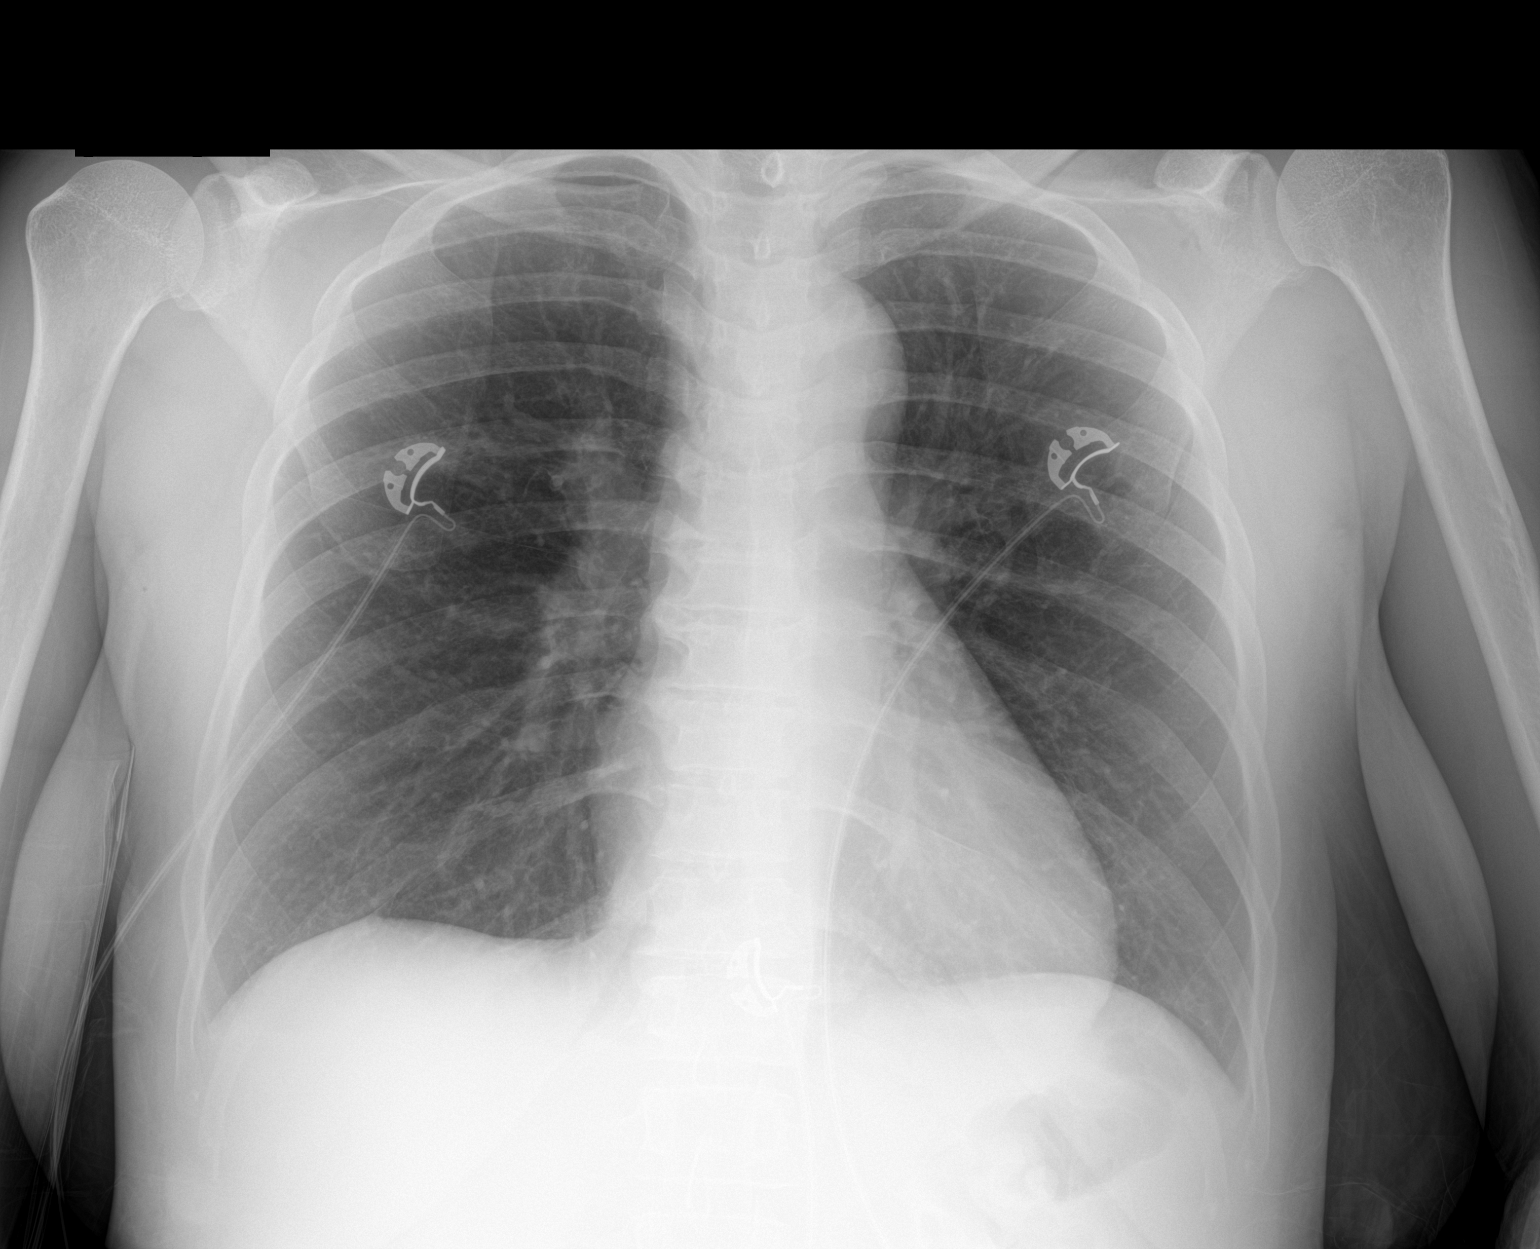

[1 of 1 positions shown; findings below may reference images not displayed]

FINDINGS: No consolidation, features of edema, pneumothorax, or effusion.
Pulmonary vascularity is normally distributed. The cardiomediastinal
contours are unremarkable. No acute osseous or soft tissue
abnormality.
IMPRESSION: No acute cardiopulmonary abnormality.

## 2023-01-25 ENCOUNTER — Other Ambulatory Visit: Payer: Self-pay | Admitting: Obstetrics and Gynecology

## 2023-01-25 DIAGNOSIS — N920 Excessive and frequent menstruation with regular cycle: Secondary | ICD-10-CM

## 2023-01-25 DIAGNOSIS — D219 Benign neoplasm of connective and other soft tissue, unspecified: Secondary | ICD-10-CM

## 2023-01-25 DIAGNOSIS — D500 Iron deficiency anemia secondary to blood loss (chronic): Secondary | ICD-10-CM

## 2023-03-31 ENCOUNTER — Encounter (HOSPITAL_COMMUNITY): Payer: Self-pay

## 2023-03-31 ENCOUNTER — Emergency Department (HOSPITAL_COMMUNITY)
Admission: EM | Admit: 2023-03-31 | Discharge: 2023-03-31 | Disposition: A | Discharge status: Home / Self Care | Payer: 59 | Attending: Emergency Medicine | Admitting: Emergency Medicine

## 2023-03-31 ENCOUNTER — Other Ambulatory Visit: Payer: Self-pay

## 2023-03-31 ENCOUNTER — Emergency Department (HOSPITAL_COMMUNITY): Payer: 59

## 2023-03-31 DIAGNOSIS — E876 Hypokalemia: Secondary | ICD-10-CM | POA: Insufficient documentation

## 2023-03-31 DIAGNOSIS — R55 Syncope and collapse: Secondary | ICD-10-CM

## 2023-03-31 DIAGNOSIS — Z8673 Personal history of transient ischemic attack (TIA), and cerebral infarction without residual deficits: Secondary | ICD-10-CM | POA: Insufficient documentation

## 2023-03-31 DIAGNOSIS — J45909 Unspecified asthma, uncomplicated: Secondary | ICD-10-CM | POA: Diagnosis not present

## 2023-03-31 DIAGNOSIS — Z91040 Latex allergy status: Secondary | ICD-10-CM | POA: Insufficient documentation

## 2023-03-31 LAB — URINALYSIS, ROUTINE W REFLEX MICROSCOPIC
Bacteria, UA: NONE SEEN
Bilirubin Urine: NEGATIVE
Glucose, UA: NEGATIVE mg/dL
Ketones, ur: NEGATIVE mg/dL
Leukocytes,Ua: NEGATIVE
Nitrite: NEGATIVE
Protein, ur: NEGATIVE mg/dL
Specific Gravity, Urine: 1.013 (ref 1.005–1.030)
pH: 5 (ref 5.0–8.0)

## 2023-03-31 LAB — CBG MONITORING, ED: Glucose-Capillary: 133 mg/dL — ABNORMAL HIGH (ref 70–99)

## 2023-03-31 LAB — BASIC METABOLIC PANEL
Anion gap: 7 (ref 5–15)
BUN: 11 mg/dL (ref 6–20)
CO2: 23 mmol/L (ref 22–32)
Calcium: 8.5 mg/dL — ABNORMAL LOW (ref 8.9–10.3)
Chloride: 105 mmol/L (ref 98–111)
Creatinine, Ser: 0.8 mg/dL (ref 0.44–1.00)
GFR, Estimated: >60 mL/min (ref >60)
Glucose, Bld: 120 mg/dL — ABNORMAL HIGH (ref 70–99)
Potassium: 3.2 mmol/L — ABNORMAL LOW (ref 3.5–5.1)
Sodium: 135 mmol/L (ref 135–145)

## 2023-03-31 LAB — RAPID URINE DRUG SCREEN, HOSP PERFORMED
Amphetamines: NOT DETECTED
Barbiturates: NOT DETECTED
Benzodiazepines: NOT DETECTED
Cocaine: POSITIVE — AB
Opiates: NOT DETECTED
Tetrahydrocannabinol: POSITIVE — AB

## 2023-03-31 LAB — CBC
HCT: 31 % — ABNORMAL LOW (ref 36.0–46.0)
Hemoglobin: 9.2 g/dL — ABNORMAL LOW (ref 12.0–15.0)
MCH: 24.7 pg — ABNORMAL LOW (ref 26.0–34.0)
MCHC: 29.7 g/dL — ABNORMAL LOW (ref 30.0–36.0)
MCV: 83.3 fL (ref 80.0–100.0)
Platelets: 361 10*3/uL (ref 150–400)
RBC: 3.72 MIL/uL — ABNORMAL LOW (ref 3.87–5.11)
RDW: 16.8 % — ABNORMAL HIGH (ref 11.5–15.5)
WBC: 4.4 10*3/uL (ref 4.0–10.5)
nRBC: 0 % (ref 0.0–0.2)

## 2023-03-31 LAB — I-STAT BETA HCG BLOOD, ED (MC, WL, AP ONLY): I-stat hCG, quantitative: <5 m[IU]/mL (ref <5)

## 2023-03-31 MED ORDER — SODIUM CHLORIDE 0.9 % IV BOLUS
1000.0000 mL | Freq: Once | INTRAVENOUS | Status: AC
  Administered 2023-03-31: 1000 mL via INTRAVENOUS

## 2023-03-31 MED ORDER — POTASSIUM CHLORIDE CRYS ER 20 MEQ PO TBCR
40.0000 meq | EXTENDED_RELEASE_TABLET | Freq: Once | ORAL | Status: AC
  Administered 2023-03-31: 40 meq via ORAL
  Filled 2023-03-31: qty 2

## 2023-03-31 NOTE — ED Triage Notes (Signed)
Patient reports tonight she felt lightheaded and had syncopal episode, son reports he saw her pass out and hit the floor. Endorses hitting head and hearing ringing in her ears. States she also has had left flank pain all day. Endorses frequent urination.

## 2023-03-31 NOTE — Discharge Instructions (Addendum)
Return to the ED with any new or worsening signs or symptoms Please follow-up with your PCP for reevaluation Please discontinue use of marijuana edibles at night before bed.

## 2023-03-31 NOTE — ED Provider Notes (Signed)
[facility] EMERGENCY DEPARTMENT AT [facility] Provider Note   CSN: [id number] Arrival date & time: 03/31/23  0121     History  Chief Complaint  Patient presents with   Loss of Consciousness   Flank Pain    RUQAYYAH Ross is a 47 y.o. female history of asthma, stroke. Patient presents to ED for evaluation of loss of consciousness.  The patient reports that she took a marijuana edible prior to falling asleep.  She reports that this marijuana edible was 4000 mg of THC.  Her family at the bedside reports the patient often uses marijuana edibles prior to bedtime.  Patient reports that she woke up after taking this edible and had a sudden craving for a chicken salad sandwich.  The patient states that she then walked into her kitchen and began making this chicken salad sandwich and she began feeling all of a sudden very dizzy and called out to her son to come to the kitchen because she felt she was going to pass out.  The patient then states that she passed out, this is confirmed by her son at the bedside.  Patient's son reports that she hit her head on a cabinet while falling down.  The patient son reports that she did not urinate on herself, she did not bite her tongue, she has no history of seizures.  He states that she was unconscious for maybe 1 minute before regaining consciousness, he denies any sort of postictal activity.  The patient is endorsing ringing in her ear, right-sided flank pain.  She denies dysuria, nausea, vomiting, chest pain or shortness of breath.  She denies any chest pain or shortness of breath preceding her syncopal event. She denies flank pain to me. She denies one sided weakness or numbness.   Loss of Consciousness Associated symptoms: no chest pain, no dizziness, no fever, no nausea, no shortness of breath, no vomiting and no weakness   Flank Pain Pertinent negatives include no chest pain and no shortness of breath.       Home Medications Prior to  Admission medications   Medication Sig Start Date End Date Taking? Authorizing Provider  ferrous sulfate 325 (65 FE) MG tablet Take 1 tablet (325 mg total) by mouth 2 (two) times daily with a meal. 07/12/22 08/11/22  Dorcas Carrow, MD  hydrOXYzine (VISTARIL) 25 MG capsule Take 1 capsule (25 mg total) by mouth every 8 (eight) hours as needed. Patient not taking: Reported on 10/11/2022 08/30/22   Rema Fendt, NP  megestrol (MEGACE) 40 MG tablet Take 1 tablet (40 mg total) by mouth 2 (two) times daily. 07/28/22   Hercules Bing, MD  sertraline (ZOLOFT) 25 MG tablet Take 1 tablet (25 mg total) by mouth daily. Patient not taking: Reported on 10/11/2022 08/30/22   Rema Fendt, NP  triamcinolone ointment (KENALOG) 0.5 % Apply 1 Application topically 2 (two) times daily. Patient not taking: Reported on 10/11/2022 08/30/22   Rema Fendt, NP      Allergies    Latex and Percocet [oxycodone-acetaminophen]    Review of Systems   Review of Systems  Constitutional:  Negative for fever.  Respiratory:  Negative for shortness of breath.   Cardiovascular:  Positive for syncope. Negative for chest pain.  Gastrointestinal:  Negative for nausea and vomiting.  Genitourinary:  Negative for flank pain.  Neurological:  Positive for syncope. Negative for dizziness, weakness, light-headedness and numbness.  All other systems reviewed and are negative.   Physical  Exam Updated Vital Signs BP 102/83   Pulse 67   Temp 98.1 F (36.7 C) (Oral)   Resp 16   Ht 5\' 4"  (1.626 m)   Wt 70.3 kg   SpO2 100%   BMI 26.61 kg/m  Physical Exam Vitals and nursing note reviewed.  Constitutional:      General: She is not in acute distress.    Appearance: Normal appearance. She is not ill-appearing, toxic-appearing or diaphoretic.  HENT:     Head: Normocephalic and atraumatic.     Nose: Nose normal.     Mouth/Throat:     Mouth: Mucous membranes are moist.     Pharynx: Oropharynx is clear.  Eyes:      Extraocular Movements: Extraocular movements intact.     Conjunctiva/sclera: Conjunctivae normal.     Pupils: Pupils are equal, round, and reactive to light.  Cardiovascular:     Rate and Rhythm: Normal rate and regular rhythm.  Pulmonary:     Effort: Pulmonary effort is normal.     Breath sounds: Normal breath sounds. No wheezing.  Abdominal:     General: Abdomen is flat. Bowel sounds are normal.     Palpations: Abdomen is soft.     Tenderness: There is no abdominal tenderness. There is no right CVA tenderness or left CVA tenderness.  Musculoskeletal:     Cervical back: Normal range of motion and neck supple. No tenderness.  Skin:    General: Skin is warm and dry.     Capillary Refill: Capillary refill takes less than 2 seconds.  Neurological:     General: No focal deficit present.     Mental Status: She is alert and oriented to person, place, and time.     GCS: GCS eye subscore is 4. GCS verbal subscore is 5. GCS motor subscore is 6.     Cranial Nerves: Cranial nerves 2-12 are intact. No cranial nerve deficit.     Sensory: Sensation is intact. No sensory deficit.     Motor: Motor function is intact. No weakness.     Coordination: Coordination is intact. Coordination normal. Heel to [facility] Test normal.     Comments: No pronator drift, no slurred speech, no facial droop. CN II through XII intact. Intact finger to nose, heel to shin. 5/5 strength bilateral upper and lower extremities. Walks with steady gait     ED Results / Procedures / Treatments   Labs (all labs ordered are listed, but only abnormal results are displayed) Labs Reviewed  BASIC METABOLIC PANEL - Abnormal; Notable for the following components:      Result Value   Potassium 3.2 (*)    Glucose, Bld 120 (*)    Calcium 8.5 (*)    All other components within normal limits  CBC - Abnormal; Notable for the following components:   RBC 3.72 (*)    Hemoglobin 9.2 (*)    HCT 31.0 (*)    MCH 24.7 (*)    MCHC 29.7 (*)     RDW 16.8 (*)    All other components within normal limits  URINALYSIS, ROUTINE W REFLEX MICROSCOPIC - Abnormal; Notable for the following components:   Hgb urine dipstick SMALL (*)    All other components within normal limits  RAPID URINE DRUG SCREEN, HOSP PERFORMED - Abnormal; Notable for the following components:   Cocaine POSITIVE (*)    Tetrahydrocannabinol POSITIVE (*)    All other components within normal limits  CBG MONITORING, ED - Abnormal; Notable for  the following components:   Glucose-Capillary 133 (*)    All other components within normal limits  I-STAT BETA HCG BLOOD, ED ([facility], [facility], [facility] ONLY)    EKG None  Radiology CT Head Wo Contrast  Result Date: 03/31/2023 CLINICAL DATA:  Syncope and subsequent fall. EXAM: CT HEAD WITHOUT CONTRAST TECHNIQUE: Contiguous axial images were obtained from the base of the skull through the vertex without intravenous contrast. RADIATION DOSE REDUCTION: This exam was performed according to the departmental dose-optimization program which includes automated exposure control, adjustment of the mA and/or kV according to patient size and/or use of iterative reconstruction technique. COMPARISON:  November 25, 2020 FINDINGS: Brain: No evidence of acute infarction, hemorrhage, hydrocephalus, extra-axial collection or mass lesion/mass effect. Vascular: No hyperdense vessel or unexpected calcification. Skull: Moderate to marked severity bilateral ethmoid sinus mucosal thickening is seen. Sinuses/Orbits: No acute finding. Other: None. IMPRESSION: 1. No acute intracranial abnormality. 2. Moderate to marked severity bilateral ethmoid sinus disease. Electronically Signed   By: Aram Candela M.D.   On: 03/31/2023 03:45    Procedures Procedures   Medications Ordered in ED Medications  sodium chloride 0.9 % bolus 1,000 mL (0 mLs Intravenous Stopped 03/31/23 0407)  potassium chloride SA (KLOR-CON M) CR tablet 40 mEq (40 mEq Oral Given 03/31/23 0415)    ED  Course/ Medical Decision Making/ A&P  Medical Decision Making Amount and/or Complexity of Data Reviewed Labs: ordered. Radiology: ordered.  Risk Prescription drug management.   Presents to the please see HPI for details.  On examination the patient is afebrile and nontachycardic.  Her lung sounds are clear bilaterally and she is hypoxic.  Her abdomen is soft and throughout, there is no CVA tenderness bilaterally.  Her neurologic examination is reassuring, no pronator drift or slurred speech.  She is alert and oriented x 3.  CBC without leukocytosis, baseline hemoglobin 9.2.  Metabolic panel shows potassium 3.2 repleted with 40 mEq oral potassium.  Chart view shows chronic hypokalemia.  Rapid urine drug screen positive for cocaine and THC.  Urinalysis unremarkable.  Point-of-care CBG 133.  EKG nonischemic.  CT head without contrast unremarkable.  Patient given bolus of fluid.  The patient was reassessed after this bolus of fluids was administered.  The patient had a steady gait, she feels much better at this time.  She ambulates with a steady gait to the bathroom.  Her coordination is intact.  She lifts all extremities symmetrically and coordinated fashion.  She is requesting discharge.  Return precautions were provided to the patient and she voiced understanding.  The patient had all of her questions answered to her satisfaction.  The patient will follow-up with her PCP. She is stable to discharge.    Final Clinical Impression(s) / ED Diagnoses Final diagnoses:  Syncope and collapse    Rx / DC Orders ED Discharge Orders     None         Al Decant, PA-C 03/31/23 0429    Shon Baton, MD 03/31/23 [phone]

## 2023-04-11 IMAGING — US US PELVIS COMPLETE WITH TRANSVAGINAL
1 series · 13 of 25 positions shown · non-contrast
Comparison: 06/27/15

CLINICAL DATA: Menorrhagia with regular cycles.

EXAM:
TRANSABDOMINAL AND TRANSVAGINAL ULTRASOUND OF PELVIS
TECHNIQUE: Both transabdominal and transvaginal ultrasound examinations of the
pelvis were performed. Transabdominal technique was performed for
global imaging of the pelvis including uterus, ovaries, adnexal
regions, and pelvic cul-de-sac. It was necessary to proceed with
endovaginal exam following the transabdominal exam to visualize the
uterus, endometrium and ovaries.

[Series 1: us pelvic complete with transvaginal · 13 of 93 slices shown]
[im 1/93]
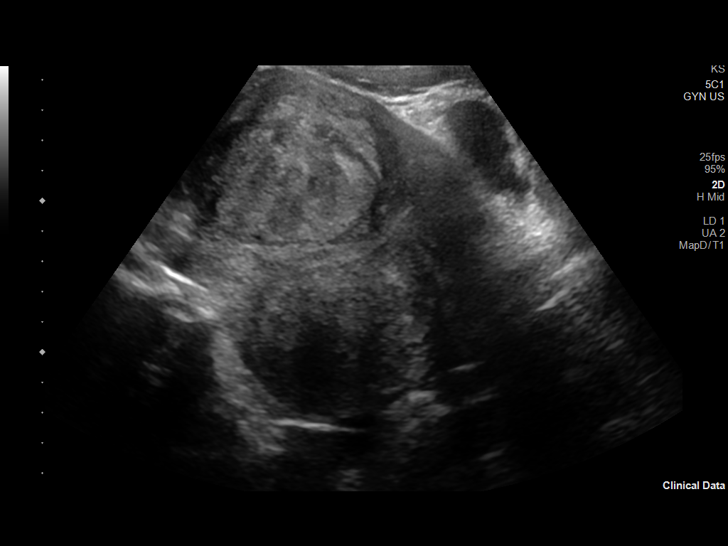
[im 8/93]
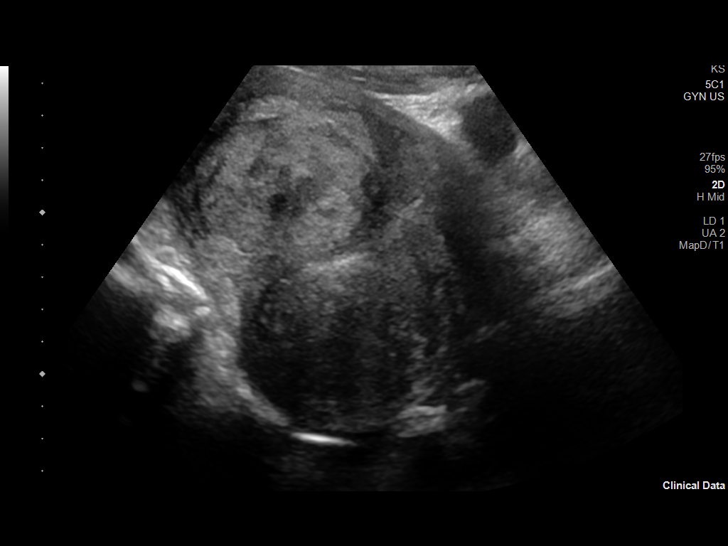
[im 16/93]
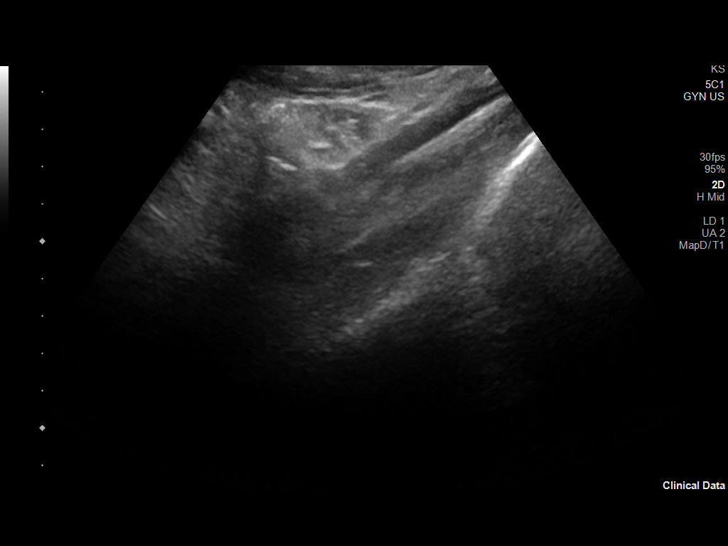
[im 24/93]
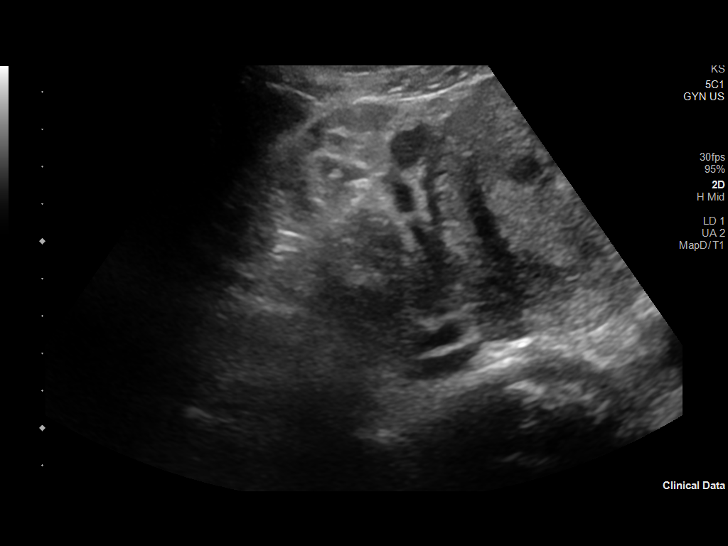
[im 31/93]
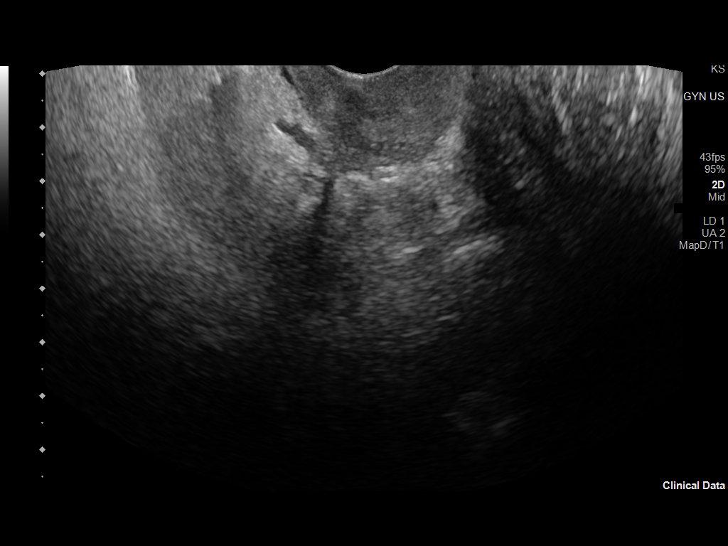
[im 39/93]
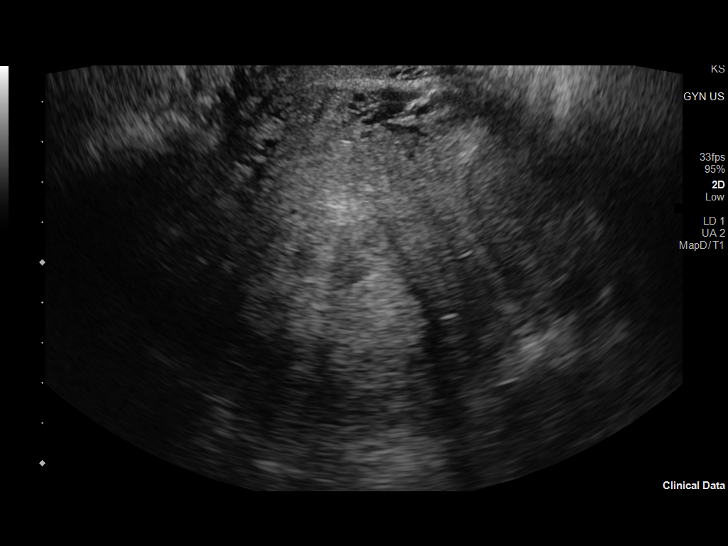
[im 47/93]
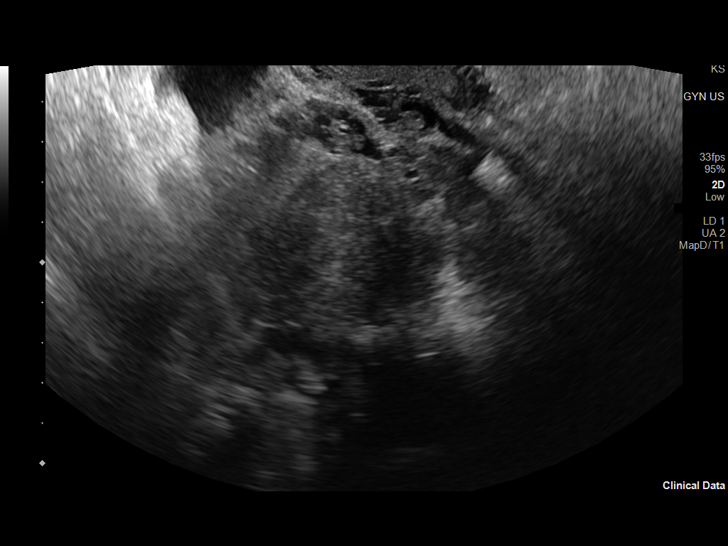
[im 54/93]
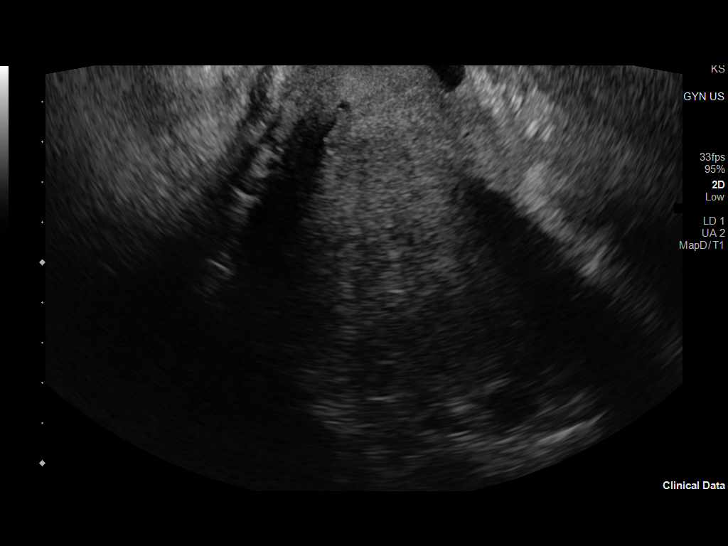
[im 62/93]
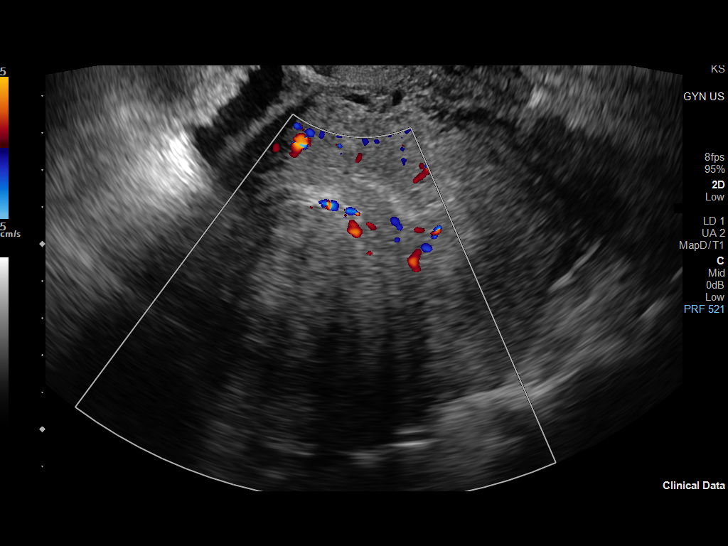
[im 70/93]
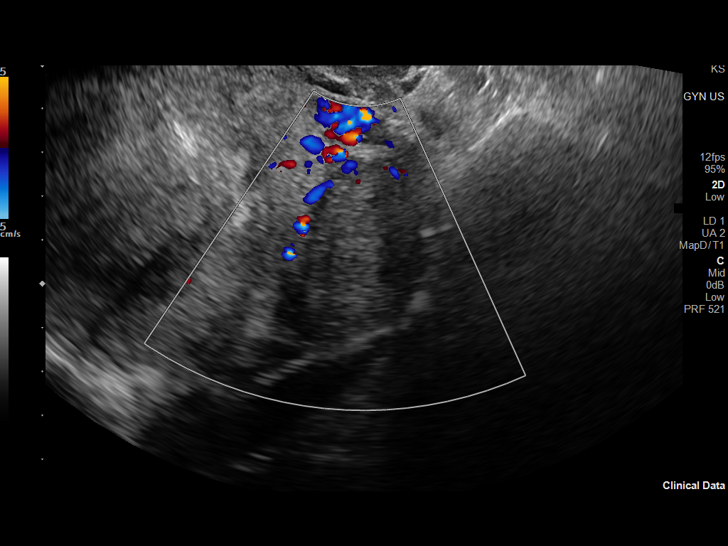
[im 77/93]
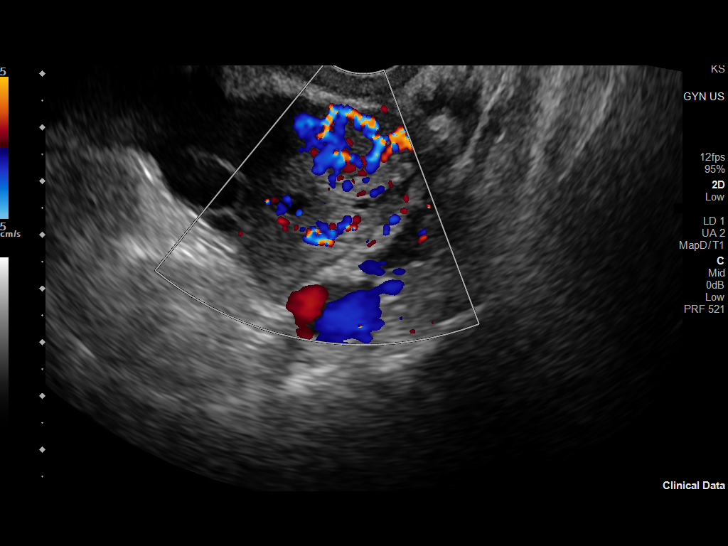
[im 85/93]
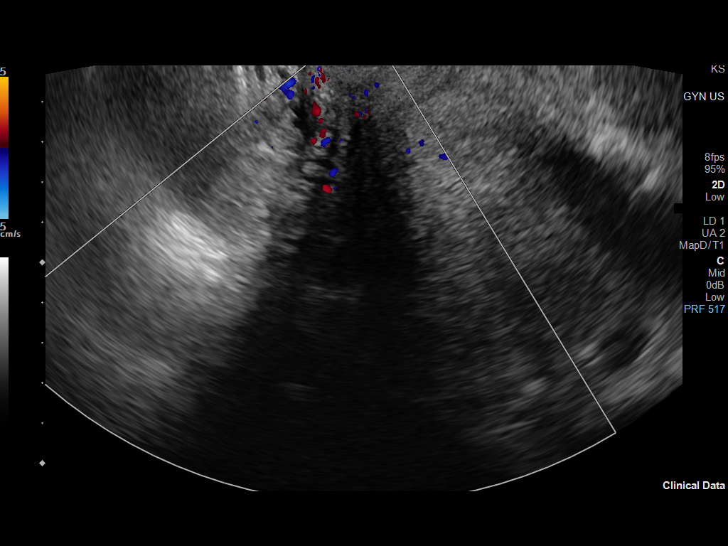
[im 93/93]
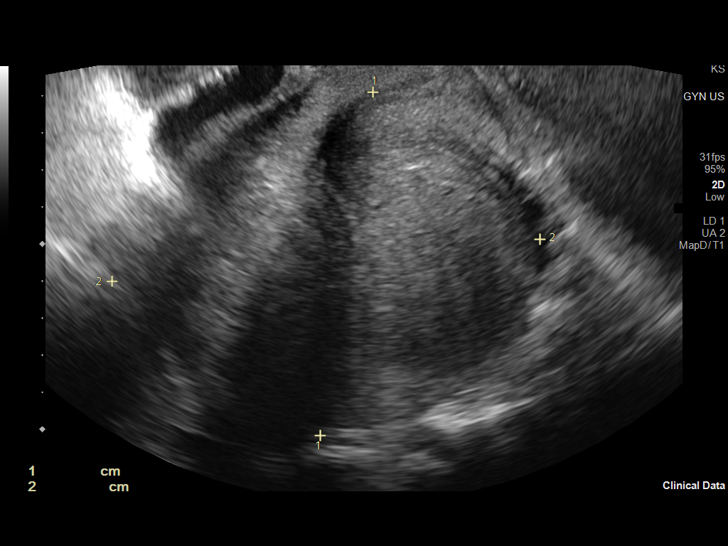

[13 of 25 positions shown; findings below may reference images not displayed]

FINDINGS: Uterus

Measurements: 12.2 x 9.3 x 9.8 cm = volume: 579.9 mL. Three large
fibroids are identified.

-Anterior fundal fibroid measures 7 x 5 x 6 cm. This is partially
submucosal.

-within the mid body of uterus there is a partially submucosal and
intramural fibroid measuring 7 x 5 x 6 cm.

Lower uterine segment, anterior intramural fibroid measures 4 x 4 x
3 cm.

Endometrium

Thickness: Measures approximately 6 mm but difficult to visualize
due to distorted anatomy from fibroids.

Right ovary

Not visualized

Left ovary

Measurements: 3.0 x 2.2 x 4.1 cm = volume: 14.5 mL. Normal
appearance/no adnexal mass.

Other findings

Nabothian cysts noted within the cervix.
IMPRESSION: 1. Enlarged fibroid uterus. Three large fibroids are noted to of
which appear partially submucosal.
2. Nonvisualization of the right ovary.

## 2023-09-28 ENCOUNTER — Emergency Department (HOSPITAL_COMMUNITY): Payer: 59

## 2023-09-28 ENCOUNTER — Encounter (HOSPITAL_COMMUNITY): Payer: Self-pay

## 2023-09-28 ENCOUNTER — Other Ambulatory Visit: Payer: Self-pay

## 2023-09-28 ENCOUNTER — Emergency Department (HOSPITAL_COMMUNITY)
Admission: EM | Admit: 2023-09-28 | Discharge: 2023-09-28 | Disposition: A | Discharge status: Home / Self Care | Payer: 59 | Attending: Student | Admitting: Student

## 2023-09-28 DIAGNOSIS — S92532A Displaced fracture of distal phalanx of left lesser toe(s), initial encounter for closed fracture: Secondary | ICD-10-CM | POA: Diagnosis not present

## 2023-09-28 DIAGNOSIS — F17210 Nicotine dependence, cigarettes, uncomplicated: Secondary | ICD-10-CM | POA: Diagnosis not present

## 2023-09-28 DIAGNOSIS — R0982 Postnasal drip: Secondary | ICD-10-CM | POA: Diagnosis not present

## 2023-09-28 DIAGNOSIS — F1490 Cocaine use, unspecified, uncomplicated: Secondary | ICD-10-CM | POA: Diagnosis not present

## 2023-09-28 DIAGNOSIS — J45909 Unspecified asthma, uncomplicated: Secondary | ICD-10-CM | POA: Insufficient documentation

## 2023-09-28 DIAGNOSIS — R0981 Nasal congestion: Secondary | ICD-10-CM | POA: Diagnosis not present

## 2023-09-28 DIAGNOSIS — N898 Other specified noninflammatory disorders of vagina: Secondary | ICD-10-CM | POA: Insufficient documentation

## 2023-09-28 DIAGNOSIS — Z1152 Encounter for screening for COVID-19: Secondary | ICD-10-CM | POA: Diagnosis not present

## 2023-09-28 DIAGNOSIS — M79672 Pain in left foot: Secondary | ICD-10-CM | POA: Insufficient documentation

## 2023-09-28 LAB — RESP PANEL BY RT-PCR (RSV, FLU A&B, COVID)  RVPGX2
Influenza A by PCR: NEGATIVE
Influenza B by PCR: NEGATIVE
Resp Syncytial Virus by PCR: NEGATIVE
SARS Coronavirus 2 by RT PCR: NEGATIVE

## 2023-09-28 LAB — URINALYSIS, ROUTINE W REFLEX MICROSCOPIC
Bilirubin Urine: NEGATIVE
Glucose, UA: NEGATIVE mg/dL
Hgb urine dipstick: NEGATIVE
Ketones, ur: NEGATIVE mg/dL
Leukocytes,Ua: NEGATIVE
Nitrite: NEGATIVE
Protein, ur: NEGATIVE mg/dL
Specific Gravity, Urine: 1.003 — ABNORMAL LOW (ref 1.005–1.030)
pH: 6 (ref 5.0–8.0)

## 2023-09-28 LAB — WET PREP, GENITAL
Clue Cells Wet Prep HPF POC: NONE SEEN
Sperm: NONE SEEN
Trich, Wet Prep: NONE SEEN
WBC, Wet Prep HPF POC: <10 (ref <10)
Yeast Wet Prep HPF POC: NONE SEEN

## 2023-09-28 LAB — PREGNANCY, URINE: Preg Test, Ur: NEGATIVE

## 2023-09-28 MED ORDER — CEFTRIAXONE SODIUM 1 G IJ SOLR
500.0000 mg | Freq: Once | INTRAMUSCULAR | Status: AC
  Administered 2023-09-28: 500 mg via INTRAMUSCULAR
  Filled 2023-09-28: qty 10

## 2023-09-28 MED ORDER — LIDOCAINE HCL (PF) 1 % IJ SOLN
1.0000 mL | Freq: Once | INTRAMUSCULAR | Status: AC
  Administered 2023-09-28: 2.1 mL
  Filled 2023-09-28: qty 30

## 2023-09-28 MED ORDER — KETOROLAC TROMETHAMINE 15 MG/ML IJ SOLN
15.0000 mg | Freq: Once | INTRAMUSCULAR | Status: AC
  Administered 2023-09-28: 15 mg via INTRAMUSCULAR
  Filled 2023-09-28: qty 1

## 2023-09-28 MED ORDER — DOXYCYCLINE HYCLATE 100 MG PO TABS
100.0000 mg | ORAL_TABLET | Freq: Once | ORAL | Status: AC
  Administered 2023-09-28: 100 mg via ORAL
  Filled 2023-09-28: qty 1

## 2023-09-28 MED ORDER — NAPROXEN 375 MG PO TABS: 375.0000 mg | ORAL_TABLET | Freq: Two times a day (BID) | ORAL | 0 refills | Status: DC

## 2023-09-28 MED ORDER — PSEUDOEPHEDRINE HCL ER 120 MG PO TB12: 120.0000 mg | ORAL_TABLET | Freq: Two times a day (BID) | ORAL | 0 refills | Status: AC

## 2023-09-28 MED ORDER — DOXYCYCLINE HYCLATE 100 MG PO CAPS: 100.0000 mg | ORAL_CAPSULE | Freq: Two times a day (BID) | ORAL | 0 refills | Status: DC

## 2023-09-28 NOTE — ED Triage Notes (Signed)
Patient reports cold like symptoms x 4 days.  Vaginal discharge x 1-2 weeks. Attempted Monistat at home without relief. Denies urinary symptoms.  Left foot problem x "a couple months. Feels there is a "knot" in her heel.

## 2023-09-28 NOTE — ED Provider Notes (Signed)
[place] EMERGENCY DEPARTMENT AT [facility] Provider Note  CSN: [id number] Arrival date & time: 09/28/23 1191  Chief Complaint(s) Foot Problem, URI, and Vaginal Discharge  HPI Wendy Ross is a 47 y.o. female with PMH asthma, COVID-19, HLD, abnormal uterine bleeding, previous CVA who presents emergency department for evaluation of multiple complaints including left heel pain, nasal congestion and vaginal discharge.  She states that for the last few weeks she has had severe left heel pain worse through all hours of the day and worse when walking.  Able to toe walk on the foot without any difficulty.  No trauma to the foot.  Also states that she recently became sexually active with her husband again and has started to have copious clear discharge.  Has been using boric acid vaginal suppositories without improvement.  Also endorsing mild sore throat and postnasal drip.   Past Medical History Past Medical History:  Diagnosis Date   Asthma    COVID-19 11/2020   Hypercholesteremia    Stroke [facility])    Vaginal Pap smear, abnormal    Patient Active Problem List   Diagnosis Date Noted   Dyslipidemia 07/12/2022   Acute on chronic blood loss anemia    Menorrhagia with regular cycle 03/29/2021   Fibroid uterus 03/19/2021   Cocaine use    Tobacco use disorder    Cerebral embolism with cerebral infarction 11/26/2020   Symptomatic anemia 11/25/2020   Abnormal uterine bleeding (AUB) 07/05/2017   Dysmenorrhea 07/05/2017   Eczema 07/05/2017   Home Medication(s) Prior to Admission medications   Medication Sig Start Date End Date Taking? Authorizing Provider  ferrous sulfate 325 (65 FE) MG tablet Take 1 tablet (325 mg total) by mouth 2 (two) times daily with a meal. 07/12/22 08/11/22  Dorcas Carrow, MD  hydrOXYzine (VISTARIL) 25 MG capsule Take 1 capsule (25 mg total) by mouth every 8 (eight) hours as needed. Patient not taking: Reported on 10/11/2022 08/30/22   Rema Fendt, NP   megestrol (MEGACE) 40 MG tablet Take 1 tablet (40 mg total) by mouth 2 (two) times daily. 07/28/22   Canyon Day Bing, MD  sertraline (ZOLOFT) 25 MG tablet Take 1 tablet (25 mg total) by mouth daily. Patient not taking: Reported on 10/11/2022 08/30/22   Rema Fendt, NP  triamcinolone ointment (KENALOG) 0.5 % Apply 1 Application topically 2 (two) times daily. Patient not taking: Reported on 10/11/2022 08/30/22   Rema Fendt, NP                                                                                                                                    Past Surgical History Past Surgical History:  Procedure Laterality Date   ENDOMETRIAL BIOPSY  08/01/2022   TUBAL LIGATION     WISDOM TOOTH EXTRACTION     Family History Family History  Problem Relation Age of Onset   Hypertension Mother  Hypertension Other    COPD Father    Tuberculosis Father    Hypertension Sister    Hypertension Brother     Social History Social History   Tobacco Use   Smoking status: Every Day    Current packs/day: 0.25    Types: Cigarettes    Passive exposure: Current   Smokeless tobacco: Never  Vaping Use   Vaping status: Every Day   Substances: THC  Substance Use Topics   Alcohol use: Yes    Comment: social   Drug use: No   Allergies Latex and Percocet [oxycodone-acetaminophen]  Review of Systems Review of Systems  HENT:  Positive for postnasal drip.   Genitourinary:  Positive for vaginal discharge.  Musculoskeletal:  Positive for arthralgias and myalgias.    Physical Exam Vital Signs  I have reviewed the triage vital signs BP 125/63   Pulse 75   Temp 98.3 F (36.8 C) (Oral)   Resp 16   Ht 5\' 4"  (1.626 m)   Wt 70.3 kg   SpO2 93%   BMI 26.60 kg/m   Physical Exam Vitals and nursing note reviewed.  Constitutional:      General: She is not in acute distress.    Appearance: She is well-developed.  HENT:     Head: Normocephalic and atraumatic.  Eyes:      Conjunctiva/sclera: Conjunctivae normal.  Cardiovascular:     Rate and Rhythm: Normal rate and regular rhythm.     Heart sounds: No murmur heard. Pulmonary:     Effort: Pulmonary effort is normal. No respiratory distress.     Breath sounds: Normal breath sounds.  Abdominal:     Palpations: Abdomen is soft.     Tenderness: There is no abdominal tenderness.  Musculoskeletal:        General: Tenderness present. No swelling.     Cervical back: Neck supple.  Skin:    General: Skin is warm and dry.     Capillary Refill: Capillary refill takes less than 2 seconds.  Neurological:     Mental Status: She is alert.  Psychiatric:        Mood and Affect: Mood normal.     ED Results and Treatments Labs (all labs ordered are listed, but only abnormal results are displayed) Labs Reviewed  URINALYSIS, ROUTINE W REFLEX MICROSCOPIC - Abnormal; Notable for the following components:      Result Value   Color, Urine COLORLESS (*)    Specific Gravity, Urine 1.003 (*)    All other components within normal limits  RESP PANEL BY RT-PCR (RSV, FLU A&B, COVID)  RVPGX2  WET PREP, GENITAL  PREGNANCY, URINE  GC/CHLAMYDIA PROBE AMP ([place]) NOT AT [facility]                                                                                                                          Radiology No results found.  Pertinent labs & imaging results that were available during my care  of the patient were reviewed by me and considered in my medical decision making (see MDM for details).  Medications Ordered in ED Medications  ketorolac (TORADOL) 15 MG/ML injection 15 mg (15 mg Intramuscular Given 09/28/23 4098)                                                                                                                                     Procedures Procedures  (including critical care time)  Medical Decision Making / ED Course   This patient presents to the ED for concern of postnasal drip, foot pain,  vaginal discharge, this involves an extensive number of treatment options, and is a complaint that carries with it a high risk of complications and morbidity.  The differential diagnosis includes fracture, calcaneal spur, tendinitis, retained foreign body, STI, BV, upper respiratory infection  MDM: Patient seen emergency room for evaluation multiple complaints described above.  Physical exam with some very mild discharge in the vaginal canal, point tenderness to the heel on the left but is otherwise unremarkable.  Laboratory evaluation largely unremarkable.  X-ray imaging of the foot is unremarkable.  Wet prep negative.  Patient will be empirically treated for gonorrhea chlamydia with ceftriaxone doxycycline.  Symptoms improved with Toradol.  At this time, patient does not meet inpatient criteria for admission and will be discharged with outpatient podiatry follow-up.  Outpatient podiatry referral placed.  Patient be discharged with Naprosyn for the foot pain, doxycycline for the vaginal discharge and Sudafed for her postnasal drip.  Patient discharged with outpatient follow-up and return precautions given which she voiced understanding   Additional history obtained: -External records from outside source obtained and reviewed including: Chart review including previous notes, labs, imaging, consultation notes   Lab Tests: -I ordered, reviewed, and interpreted labs.   The pertinent results include:   Labs Reviewed  URINALYSIS, ROUTINE W REFLEX MICROSCOPIC - Abnormal; Notable for the following components:      Result Value   Color, Urine COLORLESS (*)    Specific Gravity, Urine 1.003 (*)    All other components within normal limits  RESP PANEL BY RT-PCR (RSV, FLU A&B, COVID)  RVPGX2  WET PREP, GENITAL  PREGNANCY, URINE  GC/CHLAMYDIA PROBE AMP ([place]) NOT AT [facility]         Imaging Studies ordered: I ordered imaging studies including foot x-ray I independently visualized and interpreted  imaging. I agree with the radiologist interpretation   Medicines ordered and prescription drug management: Meds ordered this encounter  Medications   ketorolac (TORADOL) 15 MG/ML injection 15 mg    -I have reviewed the patients home medicines and have made adjustments as needed  Critical interventions none    Cardiac Monitoring: The patient was maintained on a cardiac monitor.  I personally viewed and interpreted the cardiac monitored which showed an underlying rhythm of: NSR  Social Determinants of Health:  Factors impacting patients care include: Recently  restarted a romantic relationship with her husband   Reevaluation: After the interventions noted above, I reevaluated the patient and found that they have :improved  Co morbidities that complicate the patient evaluation  Past Medical History:  Diagnosis Date   Asthma    COVID-19 11/2020   Hypercholesteremia    Stroke [facility])    Vaginal Pap smear, abnormal       Dispostion: I considered admission for this patient, but at this time she does not meet inpatient criteria for admission and will be discharged with outpatient follow-up     Final Clinical Impression(s) / ED Diagnoses Final diagnoses:  None     @PCDICTATION @    Glendora Score, MD 09/28/23 1320

## 2023-09-29 LAB — GC/CHLAMYDIA PROBE AMP (~~LOC~~) NOT AT ARMC
Chlamydia: NEGATIVE
Comment: NEGATIVE
Comment: NORMAL
Neisseria Gonorrhea: NEGATIVE

## 2023-11-06 ENCOUNTER — Encounter (INDEPENDENT_AMBULATORY_CARE_PROVIDER_SITE_OTHER): Payer: Self-pay

## 2023-11-09 ENCOUNTER — Ambulatory Visit: Payer: 59 | Admitting: Podiatry

## 2024-05-31 ENCOUNTER — Observation Stay (HOSPITAL_BASED_OUTPATIENT_CLINIC_OR_DEPARTMENT_OTHER)
Admission: EM | Admit: 2024-05-31 | Discharge: 2024-06-02 | Disposition: A | Discharge status: Home / Self Care | Payer: Self-pay | Attending: Internal Medicine | Admitting: Internal Medicine

## 2024-05-31 ENCOUNTER — Emergency Department (HOSPITAL_BASED_OUTPATIENT_CLINIC_OR_DEPARTMENT_OTHER): Payer: Self-pay | Admitting: Radiology

## 2024-05-31 ENCOUNTER — Other Ambulatory Visit: Payer: Self-pay

## 2024-05-31 ENCOUNTER — Emergency Department (HOSPITAL_BASED_OUTPATIENT_CLINIC_OR_DEPARTMENT_OTHER): Payer: Self-pay

## 2024-05-31 DIAGNOSIS — I10 Essential (primary) hypertension: Secondary | ICD-10-CM | POA: Insufficient documentation

## 2024-05-31 DIAGNOSIS — J45909 Unspecified asthma, uncomplicated: Secondary | ICD-10-CM | POA: Insufficient documentation

## 2024-05-31 DIAGNOSIS — Z8673 Personal history of transient ischemic attack (TIA), and cerebral infarction without residual deficits: Secondary | ICD-10-CM | POA: Insufficient documentation

## 2024-05-31 DIAGNOSIS — N939 Abnormal uterine and vaginal bleeding, unspecified: Secondary | ICD-10-CM | POA: Insufficient documentation

## 2024-05-31 DIAGNOSIS — Z91040 Latex allergy status: Secondary | ICD-10-CM | POA: Insufficient documentation

## 2024-05-31 DIAGNOSIS — D500 Iron deficiency anemia secondary to blood loss (chronic): Principal | ICD-10-CM

## 2024-05-31 DIAGNOSIS — F10920 Alcohol use, unspecified with intoxication, uncomplicated: Secondary | ICD-10-CM | POA: Insufficient documentation

## 2024-05-31 DIAGNOSIS — N920 Excessive and frequent menstruation with regular cycle: Secondary | ICD-10-CM | POA: Insufficient documentation

## 2024-05-31 DIAGNOSIS — Z1152 Encounter for screening for COVID-19: Secondary | ICD-10-CM | POA: Insufficient documentation

## 2024-05-31 DIAGNOSIS — R0789 Other chest pain: Secondary | ICD-10-CM

## 2024-05-31 DIAGNOSIS — D649 Anemia, unspecified: Principal | ICD-10-CM | POA: Diagnosis present

## 2024-05-31 DIAGNOSIS — F17210 Nicotine dependence, cigarettes, uncomplicated: Secondary | ICD-10-CM | POA: Insufficient documentation

## 2024-05-31 LAB — HEPATIC FUNCTION PANEL
ALT: 10 U/L (ref 0–44)
AST: 21 U/L (ref 15–41)
Albumin: 4 g/dL (ref 3.5–5.0)
Alkaline Phosphatase: 54 U/L (ref 38–126)
Bilirubin, Direct: 0.1 mg/dL (ref 0.0–0.2)
Indirect Bilirubin: 0.2 mg/dL — ABNORMAL LOW (ref 0.3–0.9)
Total Bilirubin: 0.3 mg/dL (ref 0.0–1.2)
Total Protein: 6.8 g/dL (ref 6.5–8.1)

## 2024-05-31 LAB — BASIC METABOLIC PANEL WITH GFR
Anion gap: 11 (ref 5–15)
BUN: 7 mg/dL (ref 6–20)
CO2: 22 mmol/L (ref 22–32)
Calcium: 9 mg/dL (ref 8.9–10.3)
Chloride: 107 mmol/L (ref 98–111)
Creatinine, Ser: 0.63 mg/dL (ref 0.44–1.00)
GFR, Estimated: >60 mL/min (ref >60)
Glucose, Bld: 93 mg/dL (ref 70–99)
Potassium: 3.4 mmol/L — ABNORMAL LOW (ref 3.5–5.1)
Sodium: 140 mmol/L (ref 135–145)

## 2024-05-31 LAB — FERRITIN: Ferritin: 4 ng/mL — ABNORMAL LOW (ref 11–307)

## 2024-05-31 LAB — CBC
HCT: 20.2 % — ABNORMAL LOW (ref 36.0–46.0)
Hemoglobin: 5.1 g/dL — CL (ref 12.0–15.0)
MCH: 17.1 pg — ABNORMAL LOW (ref 26.0–34.0)
MCHC: 25.2 g/dL — ABNORMAL LOW (ref 30.0–36.0)
MCV: 67.6 fL — ABNORMAL LOW (ref 80.0–100.0)
Platelets: 499 K/uL — ABNORMAL HIGH (ref 150–400)
RBC: 2.99 MIL/uL — ABNORMAL LOW (ref 3.87–5.11)
RDW: 23.9 % — ABNORMAL HIGH (ref 11.5–15.5)
WBC: 5 K/uL (ref 4.0–10.5)
nRBC: 0 % (ref 0.0–0.2)

## 2024-05-31 LAB — D-DIMER, QUANTITATIVE: D-Dimer, Quant: 0.28 ug{FEU}/mL (ref 0.00–0.50)

## 2024-05-31 LAB — RETICULOCYTES
Immature Retic Fract: 16.8 % — ABNORMAL HIGH (ref 2.3–15.9)
RBC.: 2.99 MIL/uL — ABNORMAL LOW (ref 3.87–5.11)
Retic Count, Absolute: 46.3 K/uL (ref 19.0–186.0)
Retic Ct Pct: 1.6 % (ref 0.4–3.1)

## 2024-05-31 LAB — LIPASE, BLOOD: Lipase: 19 U/L (ref 11–51)

## 2024-05-31 LAB — PREGNANCY, URINE: Preg Test, Ur: NEGATIVE

## 2024-05-31 LAB — TROPONIN T, HIGH SENSITIVITY
Troponin T High Sensitivity: <15 ng/L (ref <19)
Troponin T High Sensitivity: <15 ng/L (ref <19)

## 2024-05-31 MED ORDER — SODIUM CHLORIDE 0.9% IV SOLUTION: Freq: Once | INTRAVENOUS | Status: DC

## 2024-05-31 MED ORDER — ONDANSETRON HCL 4 MG PO TABS: 4.0000 mg | ORAL_TABLET | Freq: Four times a day (QID) | ORAL | Status: DC | PRN

## 2024-05-31 MED ORDER — ACETAMINOPHEN 650 MG RE SUPP: 650.0000 mg | Freq: Four times a day (QID) | RECTAL | Status: DC | PRN

## 2024-05-31 MED ORDER — SENNOSIDES-DOCUSATE SODIUM 8.6-50 MG PO TABS: 1.0000 | ORAL_TABLET | Freq: Every evening | ORAL | Status: DC | PRN

## 2024-05-31 MED ORDER — ACETAMINOPHEN 325 MG PO TABS: 650.0000 mg | ORAL_TABLET | Freq: Four times a day (QID) | ORAL | Status: DC | PRN

## 2024-05-31 MED ORDER — BISACODYL 5 MG PO TBEC: 5.0000 mg | DELAYED_RELEASE_TABLET | Freq: Every day | ORAL | Status: DC | PRN

## 2024-05-31 MED ORDER — ONDANSETRON HCL 4 MG/2ML IJ SOLN: 4.0000 mg | Freq: Four times a day (QID) | INTRAMUSCULAR | Status: DC | PRN

## 2024-05-31 NOTE — ED Triage Notes (Signed)
 C/o central CP that rads into back starting 3 weeks prior. States became worse last night. Hx of PE.

## 2024-05-31 NOTE — H&P (Signed)
 History and Physical  Wendy Ross FMW:[id number] DOB: Dec 10, 1975 DOA: 05/31/2024  PCP: Lorren Greig PARAS, NP   Chief Complaint: Chest discomfort, fatigue, dizziness  HPI: Wendy Ross is a 48 y.o. female with medical history significant for asthma, cryptogenic stroke, HTN, uterine fibroid, abnormal uterine bleeding and anemia who [facility] of chest discomfort, weakness and dizziness. Patient reports she has a history of heavy menstrual periods. Reports her menstrual cycle started Thursday with significant bleeding. She reports intermittent chest discomfort over the last few weeks however last night the pain became more severe. She endorsed associated fatigue, weak, palpitation and dizziness but denies any shortness of breath, headache, nausea, vomiting, bloody stools, hematuria, hemoptysis, fevers or chills. She also reports increased abdominal cramping with her period.   ED Course: Initial vitals show patient hemodynamically stable, SBP 100s to 110s. Initial labs significant for Hgb 5.1, WBC 5.0, platelet 499, K+ 3.4, ferritin 4, iron  <5, TIBC 494, normal troponin, D-dimer and kidney function, negative pregnancy test. EKG shows sinus rhythm with no ST or T wave abnormalities. CXR shows no acute cardiopulmonary disease. Patient was accepted to [facility] service and transferred to [facility].  Review of Systems: Please see HPI for pertinent positives and negatives. A complete 10 system review of systems are otherwise negative.  Past Medical History:  Diagnosis Date   Asthma    COVID-19 11/2020   Hypercholesteremia    Stroke [facility])    Vaginal Pap smear, abnormal    Past Surgical History:  Procedure Laterality Date   ENDOMETRIAL BIOPSY  08/01/2022   TUBAL LIGATION     WISDOM TOOTH EXTRACTION     Social History:  reports that she has been smoking cigarettes. She has been exposed to tobacco smoke. She has never used smokeless tobacco. She reports  current alcohol use. She reports that she does not use drugs.  Allergies  Allergen Reactions   Latex Itching   Percocet [Oxycodone -Acetaminophen ] Itching and Nausea Only    Family History  Problem Relation Age of Onset   Hypertension Mother    Hypertension Other    COPD Father    Tuberculosis Father    Hypertension Sister    Hypertension Brother      Prior to Admission medications   Medication Sig Start Date End Date Taking? Authorizing Provider  doxycycline  (VIBRAMYCIN ) 100 MG capsule Take 1 capsule (100 mg total) by mouth 2 (two) times daily. 09/28/23   Kommor, Lum, MD  ferrous sulfate  325 (65 FE) MG tablet Take 1 tablet (325 mg total) by mouth 2 (two) times daily with a meal. 07/12/22 08/11/22  Raenelle Coria, MD  hydrOXYzine  (VISTARIL ) 25 MG capsule Take 1 capsule (25 mg total) by mouth every 8 (eight) hours as needed. Patient not taking: Reported on 10/11/2022 08/30/22   Lorren Greig PARAS, NP  megestrol  (MEGACE ) 40 MG tablet Take 1 tablet (40 mg total) by mouth 2 (two) times daily. 07/28/22   Izell Harari, MD  naproxen  (NAPROSYN ) 375 MG tablet Take 1 tablet (375 mg total) by mouth 2 (two) times daily. 09/28/23   Kommor, Madison, MD  sertraline  (ZOLOFT ) 25 MG tablet Take 1 tablet (25 mg total) by mouth daily. Patient not taking: Reported on 10/11/2022 08/30/22   Lorren Greig PARAS, NP  triamcinolone  ointment (KENALOG ) 0.5 % Apply 1 Application topically 2 (two) times daily. Patient not taking: Reported on 10/11/2022 08/30/22   Lorren Greig PARAS, NP    Physical Exam: BP 112/84 (BP  Location: Right Arm)   Pulse 70   Temp 97.9 F (36.6 C) (Oral)   Resp 18   Ht 5' 4 (1.626 m)   Wt 70.3 kg   SpO2 100%   BMI 26.59 kg/m  General: Pleasant, well-appearing middle-age woman laying in bed. No acute distress. HEENT: /AT. Anicteric sclera.  Pale conjunctiva. CV: RRR. No murmurs, rubs, or gallops. No LE edema Pulmonary: Lungs CTAB. Normal effort. No wheezing or rales. Abdominal:  Soft, nontender, nondistended. Normal bowel sounds. Extremities: Palpable radial and DP pulses. Normal ROM. Skin: Warm and dry. No obvious rash or lesions. Neuro: A&Ox3. Moves all extremities. Normal sensation to light touch. No focal deficit. Psych: Normal mood and affect          Labs on Admission:  Basic Metabolic Panel: Recent Labs  Lab 05/31/24 1808  NA 140  K 3.4*  CL 107  CO2 22  GLUCOSE 93  BUN 7  CREATININE 0.63  CALCIUM  9.0   Liver Function Tests: Recent Labs  Lab 05/31/24 1808  AST 21  ALT 10  ALKPHOS 54  BILITOT 0.3  PROT 6.8  ALBUMIN 4.0   Recent Labs  Lab 05/31/24 1808  LIPASE 19   No results for input(s): AMMONIA in the last 168 hours. CBC: Recent Labs  Lab 05/31/24 1808  WBC 5.0  HGB 5.1*  HCT 20.2*  MCV 67.6*  PLT 499*   Cardiac Enzymes: No results for input(s): CKTOTAL, CKMB, CKMBINDEX, TROPONINI in the last 168 hours. BNP (last 3 results) No results for input(s): BNP in the last 8760 hours.  ProBNP (last 3 results) No results for input(s): PROBNP in the last 8760 hours.  CBG: No results for input(s): GLUCAP in the last 168 hours.  Radiological Exams on Admission: DG Chest Port 1 View Result Date: 05/31/2024 CLINICAL DATA:  Central chest pain for several weeks, initial encounter EXAM: PORTABLE CHEST 1 VIEW COMPARISON:  11/27/2020 FINDINGS: The heart size and mediastinal contours are within normal limits. Both lungs are clear. The visualized skeletal structures are unremarkable. IMPRESSION: No active disease. Electronically Signed   By: Oneil Devonshire M.D.   On: 05/31/2024 19:23   Assessment/Plan Wendy Ross is a 48 y.o. female with medical history significant for asthma, cryptogenic stroke, HTN, uterine fibroid, abnormal uterine bleeding and anemia who [facility] of chest discomfort, weakness and dizziness and admitted for symptomatic anemia.  # Symptomatic anemia - Hgb of  5.1 on admission from baseline of 9-10 - Pt presented with fatigue, dizziness, and chest discomfort - Anemia secondary to menorrhagia in the setting of uterine fibroids - Transfused 2 units PRBC - Trend CBC and Transfuse for Hgb goal > 7  # Iron  deficiency anemia - Iron  studies shows significant low iron , and ferritin and elevated TIBC - Secondary to heavy menstrual bleeding - Give IV iron  sucrose 200 mg x 1 and consider repeat doses during this hospitalization - Trend CBC  # Abnormal uterine bleeding # Hx of uterine fibroids # Menorrhagia - Known history of uterine fibroids with prolonged and heavy menses - Evaluated by OB/GYN in 20 2030 for possible hysterectomy however patient was asked to follow-up with neurology to assess perioperative risk due to her history of cryptogenic stroke - Consider outpatient versus inpatient OB/GYN evaluation  # HTN - BP stable with SBP in the 100-110s  # Cryptogenic stroke - Needs neurology follow-up in the outpatient  # Asthma - Chronic and stable   DVT  prophylaxis: SCDs    Code Status: Full Code  Consults called: None  Family Communication: No family at bedside  Severity of Illness: The appropriate patient status for this patient is OBSERVATION. Observation status is judged to be reasonable and necessary in order to provide the required intensity of service to ensure the patient's safety. The patient's presenting symptoms, physical exam findings, and initial radiographic and laboratory data in the context of their medical condition is felt to place them at decreased risk for further clinical deterioration. Furthermore, it is anticipated that the patient will be medically stable for discharge from the hospital within 2 midnights of admission.   Level of care: Telemetry   This record has been created using Conservation officer, historic buildings. Errors have been sought and corrected, but may not always be located. Such creation errors do not  reflect on the standard of care.   Lou Claretta HERO, MD 06/01/2024, 12:38 AM [facility] Pager: [phone] Isaiah 41:10   If 7PM-7AM, please contact night-coverage [email or web address] Password [email or web address]

## 2024-05-31 NOTE — ED Provider Notes (Signed)
 Emergency Department Provider Note   I have reviewed the triage vital signs and the nursing notes.   HISTORY  Chief Complaint Chest Pain   HPI Wendy Ross is a 48 y.o. female past history of cryptogenic stroke, hypercholesterolemia, symptomatic anemia requiring blood transfusion from heavy menstrual cycles presents to the emergency department with intermittent sharp chest pain.  She states for 3 weeks she has had occasional pain in the central chest radiating to the back.  No shortness of breath.  Last night pain became more severe and she had associated fatigue with some heart palpitations.  She continues to have a mild dull left chest pain currently.  No fevers or chills.  She continues to have heavy menstrual cycles.  She states in the past she is required blood transfusions. 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  She is not currently anticoagulated.   Past Medical History:  Diagnosis Date   Asthma    COVID-19 11/2020   Hypercholesteremia    Stroke [facility]    Vaginal Pap smear, abnormal     Review of Systems  Constitutional: No fever/chills Cardiovascular: Positive chest pain and palpitations.  Respiratory: Denies shortness of breath. Gastrointestinal: No abdominal pain.  No nausea, no vomiting.   Neurological: Negative for headaches, focal weakness or numbness.  ____________________________________________   PHYSICAL EXAM:  VITAL SIGNS: ED Triage Vitals  Encounter Vitals Group     BP 05/31/24 1804 120/77     Pulse Rate 05/31/24 1804 73     Resp 05/31/24 1804 18     Temp 05/31/24 1806 98.2 F (36.8 C)     Temp Source 05/31/24 1804 Oral     SpO2 05/31/24 1804 99 %   Constitutional: Alert and oriented. Well appearing and in no acute distress. Eyes: Conjunctivae are normal.  Head: Atraumatic. Nose: No congestion/rhinnorhea. Mouth/Throat: Mucous membranes are moist.   Neck: No  stridor.   Cardiovascular: Normal rate, regular rhythm. Good peripheral circulation. Grossly normal heart sounds.   Respiratory: Normal respiratory effort.  No retractions. Lungs CTAB. Gastrointestinal: Soft and nontender. No distention.  Musculoskeletal: No lower extremity tenderness nor edema. No gross deformities of extremities. Neurologic:  Normal speech and language. No gross focal neurologic deficits are appreciated.  Skin:  Skin is warm, dry and intact. No rash noted.  ____________________________________________   LABS (all labs ordered are listed, but only abnormal results are displayed)  Labs Reviewed  BASIC METABOLIC PANEL WITH GFR - Abnormal; Notable for the following components:      Result Value   Potassium 3.4 (*)    All other components within normal limits  CBC - Abnormal; Notable for the following components:   RBC 2.99 (*)    Hemoglobin 5.1 (*)    HCT 20.2 (*)    MCV 67.6 (*)    MCH 17.1 (*)    MCHC 25.2 (*)    RDW 23.9 (*)    Platelets 499 (*)    All other components within normal limits  FERRITIN - Abnormal; Notable for the following components:   Ferritin 4 (*)    All other components within normal limits  RETICULOCYTES - Abnormal; Notable for the following components:   RBC. 2.99 (*)    Immature Retic Fract 16.8 (*)    All other components within normal limits  HEPATIC FUNCTION PANEL - Abnormal; Notable for the following components:   Indirect Bilirubin 0.2 (*)  All other components within normal limits  PREGNANCY, URINE  D-DIMER, QUANTITATIVE  LIPASE, BLOOD  VITAMIN B12  FOLATE  IRON AND TIBC  TYPE AND SCREEN  TROPONIN T, HIGH SENSITIVITY  TROPONIN T, HIGH SENSITIVITY   ____________________________________________  EKG   EKG Interpretation Date/Time:  Friday May 31 2024 18:06:05 EDT Ventricular Rate:  72 PR Interval:  163 QRS Duration:  89 QT Interval:  402 QTC Calculation: 440 R Axis:   83  Text Interpretation: Sinus rhythm  Probable left atrial enlargement Confirmed by Darra Chew [phone]) on 05/31/2024 6:25:26 PM        ____________________________________________  RADIOLOGY  DG Chest Port 1 View Result Date: 05/31/2024 CLINICAL DATA:  Central chest pain for several weeks, initial encounter EXAM: PORTABLE CHEST 1 VIEW COMPARISON:  11/27/2020 FINDINGS: The heart size and mediastinal contours are within normal limits. Both lungs are clear. The visualized skeletal structures are unremarkable. IMPRESSION: No active disease. Electronically Signed   By: Oneil Devonshire M.D.   On: 05/31/2024 19:23    ____________________________________________   PROCEDURES  Procedure(s) performed:   Procedures  CRITICAL CARE Performed by: Chew KANDICE Darra Total critical care time: 35 minutes Critical care time was exclusive of separately billable procedures and treating other patients. Critical care was necessary to treat or prevent imminent or life-threatening deterioration. Critical care was time spent personally by me on the following activities: development of treatment plan with patient and/or surrogate as well as nursing, discussions with consultants, evaluation of patient's response to treatment, examination of patient, obtaining history from patient or surrogate, ordering and performing treatments and interventions, ordering and review of laboratory studies, ordering and review of radiographic studies, pulse oximetry and re-evaluation of patient's condition.  Chew Darra, MD Emergency Medicine  ____________________________________________   INITIAL IMPRESSION / ASSESSMENT AND PLAN / ED COURSE  Pertinent labs & imaging results that were available during my care of the patient were reviewed by me and considered in my medical decision making (see chart for details).   This patient is Presenting for Evaluation of CP, which does require a range of treatment options, and is a complaint that involves a high risk of  morbidity and mortality.  The Differential Diagnoses includes but is not exclusive to acute coronary syndrome, aortic dissection, pulmonary embolism, cardiac tamponade, community-acquired pneumonia, pericarditis, musculoskeletal chest wall pain, etc.   I did obtain Additional Historical Information from [facility] at bedside.    Clinical Laboratory Tests Ordered, included CBC with anemia to 5.1. BMP without AKI.   Radiologic Tests Ordered, included CXR. I independently interpreted the images and agree with radiology interpretation.   Cardiac Monitor Tracing which shows NSR.    Social Determinants of Health Risk patient is a smoker.   Consult complete with [facility]. Dr. Charlton. Plan for admit.   Medical Decision Making: Summary:  Patient presents to the emergency department with fairly atypical chest pain worsening in the past 24 hours but present for the past 3 weeks.  Found to be anemic to 5.1 and labs from triage.  No hypotension.  This appears to be from heavy menstrual cycles.  Last admit was 2023 with symptomatic anemia at that time and blood transfusion.  Reevaluation with update and discussion with patient.  We do not have type and screen blood at this facility.  When she arrives to come and they will send a type and screen and then give her crossmatched PRBC.  No indication for emergent blood transfusion of unmatched blood at this time.  Patient's presentation is most consistent with acute presentation with potential threat to life or bodily function.   Disposition: admit   ____________________________________________  FINAL CLINICAL IMPRESSION(S) / ED DIAGNOSES  Final diagnoses:  Symptomatic anemia  Atypical chest pain    Note:  This document was prepared using Dragon voice recognition software and may include unintentional dictation errors.  Fonda Law, MD, [facility] Emergency Medicine    Trannie Bardales, Fonda MATSU, MD 05/31/24 [phone]

## 2024-05-31 NOTE — ED Notes (Signed)
 [facility] at bedside

## 2024-05-31 NOTE — Progress Notes (Signed)
 Plan of Care Note for accepted transfer   Patient: Wendy Ross MRN: [id number]   DOA: 05/31/2024  Facility requesting transfer: [facility]   Requesting Provider: Dr. Darra   Reason for transfer: Symptomatic anemia   Facility course: 48 yr old female with hx of DVT not on anticoagulation, CVA, and heavy menstrual bleeding who presents with 3 wks of chest discomfort/palpitations and more recent SOB.   She is hemodynamically stable. Hgb 5.1. D-dimer and troponin are normal. CXR is negative.   Plan of care: The patient is accepted for admission to Telemetry unit, at [facility].   Author: Evalene GORMAN Sprinkles, MD 05/31/2024  Check [email or web address] for on-call coverage.  Nursing staff, Please call [facility] Admits & Consults System-Wide number on Amion as soon as patient's arrival, so appropriate admitting provider can evaluate the pt.

## 2024-05-31 NOTE — ED Notes (Signed)
 Report given to [facility] RN at [employer]. Pt signed consent to transfer. Awaiting [facility]

## 2024-06-01 ENCOUNTER — Encounter (HOSPITAL_COMMUNITY): Payer: Self-pay | Admitting: Family Medicine

## 2024-06-01 DIAGNOSIS — D500 Iron deficiency anemia secondary to blood loss (chronic): Secondary | ICD-10-CM

## 2024-06-01 DIAGNOSIS — R0789 Other chest pain: Secondary | ICD-10-CM

## 2024-06-01 LAB — IRON AND TIBC
Iron: <5 ug/dL — ABNORMAL LOW (ref 28–170)
TIBC: 494 ug/dL — ABNORMAL HIGH (ref 250–450)

## 2024-06-01 LAB — CBC
HCT: 20.6 % — ABNORMAL LOW (ref 36.0–46.0)
HCT: 25.4 % — ABNORMAL LOW (ref 36.0–46.0)
Hemoglobin: 5 g/dL — CL (ref 12.0–15.0)
Hemoglobin: 6.9 g/dL — CL (ref 12.0–15.0)
MCH: 16.8 pg — ABNORMAL LOW (ref 26.0–34.0)
MCH: 20.5 pg — ABNORMAL LOW (ref 26.0–34.0)
MCHC: 24.3 g/dL — ABNORMAL LOW (ref 30.0–36.0)
MCHC: 27.2 g/dL — ABNORMAL LOW (ref 30.0–36.0)
MCV: 69.4 fL — ABNORMAL LOW (ref 80.0–100.0)
MCV: 75.6 fL — ABNORMAL LOW (ref 80.0–100.0)
Platelets: 411 K/uL — ABNORMAL HIGH (ref 150–400)
Platelets: 423 K/uL — ABNORMAL HIGH (ref 150–400)
RBC: 2.97 MIL/uL — ABNORMAL LOW (ref 3.87–5.11)
RBC: 3.36 MIL/uL — ABNORMAL LOW (ref 3.87–5.11)
RDW: 23.9 % — ABNORMAL HIGH (ref 11.5–15.5)
RDW: 25.3 % — ABNORMAL HIGH (ref 11.5–15.5)
WBC: 4.1 K/uL (ref 4.0–10.5)
WBC: 5.4 K/uL (ref 4.0–10.5)
nRBC: 0 % (ref 0.0–0.2)
nRBC: 0 % (ref 0.0–0.2)

## 2024-06-01 LAB — COMPREHENSIVE METABOLIC PANEL WITH GFR
ALT: 10 U/L (ref 0–44)
AST: 16 U/L (ref 15–41)
Albumin: 3.4 g/dL — ABNORMAL LOW (ref 3.5–5.0)
Alkaline Phosphatase: 44 U/L (ref 38–126)
Anion gap: 7 (ref 5–15)
BUN: 6 mg/dL (ref 6–20)
CO2: 24 mmol/L (ref 22–32)
Calcium: 8.7 mg/dL — ABNORMAL LOW (ref 8.9–10.3)
Chloride: 109 mmol/L (ref 98–111)
Creatinine, Ser: 0.57 mg/dL (ref 0.44–1.00)
GFR, Estimated: >60 mL/min (ref >60)
Glucose, Bld: 91 mg/dL (ref 70–99)
Potassium: 3.1 mmol/L — ABNORMAL LOW (ref 3.5–5.1)
Sodium: 140 mmol/L (ref 135–145)
Total Bilirubin: 0.6 mg/dL (ref 0.0–1.2)
Total Protein: 6.8 g/dL (ref 6.5–8.1)

## 2024-06-01 LAB — BASIC METABOLIC PANEL WITH GFR
Anion gap: 6 (ref 5–15)
BUN: 9 mg/dL (ref 6–20)
CO2: 22 mmol/L (ref 22–32)
Calcium: 8.5 mg/dL — ABNORMAL LOW (ref 8.9–10.3)
Chloride: 109 mmol/L (ref 98–111)
Creatinine, Ser: 0.65 mg/dL (ref 0.44–1.00)
GFR, Estimated: >60 mL/min (ref >60)
Glucose, Bld: 83 mg/dL (ref 70–99)
Potassium: 3.6 mmol/L (ref 3.5–5.1)
Sodium: 137 mmol/L (ref 135–145)

## 2024-06-01 LAB — PREPARE RBC (CROSSMATCH)

## 2024-06-01 LAB — FOLATE: Folate: 10 ng/mL (ref >5.9)

## 2024-06-01 LAB — HEMOGLOBIN AND HEMATOCRIT, BLOOD
HCT: 28.4 % — ABNORMAL LOW (ref 36.0–46.0)
Hemoglobin: 7.9 g/dL — ABNORMAL LOW (ref 12.0–15.0)

## 2024-06-01 LAB — VITAMIN B12: Vitamin B-12: 242 pg/mL (ref 180–914)

## 2024-06-01 LAB — HIV ANTIBODY (ROUTINE TESTING W REFLEX): HIV Screen 4th Generation wRfx: NONREACTIVE

## 2024-06-01 MED ORDER — MEGESTROL ACETATE 40 MG PO TABS
80.0000 mg | ORAL_TABLET | Freq: Two times a day (BID) | ORAL | Status: DC
  Administered 2024-06-01 – 2024-06-02 (×3): 80 mg via ORAL
  Filled 2024-06-01 (×3): qty 2

## 2024-06-01 MED ORDER — POTASSIUM CHLORIDE CRYS ER 20 MEQ PO TBCR
40.0000 meq | EXTENDED_RELEASE_TABLET | Freq: Once | ORAL | Status: AC
  Administered 2024-06-01: 40 meq via ORAL
  Filled 2024-06-01: qty 2

## 2024-06-01 MED ORDER — SODIUM CHLORIDE 0.9% IV SOLUTION: Freq: Once | INTRAVENOUS | Status: AC

## 2024-06-01 MED ORDER — IRON SUCROSE 200 MG IVPB - SIMPLE MED
200.0000 mg | Freq: Once | Status: AC
  Administered 2024-06-01: 200 mg via INTRAVENOUS
  Filled 2024-06-01: qty 200

## 2024-06-01 NOTE — Hospital Course (Signed)
 48yo with h/o cryptogenic stroke, HTN, uterine fibroids, and anemia who presented on 7/25 with chest discomfort and dizziness in the setting of heavy menstrual bleeding.  Hgb 5.1.  She was transfused 3 units PRBC and given IV iron .

## 2024-06-01 NOTE — Plan of Care (Signed)

## 2024-06-01 NOTE — Progress Notes (Signed)
 Progress Note   Patient: Wendy Ross FMW:[id number] DOB: 30-Sep-1976 DOA: 05/31/2024     0 DOS: the patient was seen and examined on 06/01/2024   Brief hospital course: 48yo with h/o cryptogenic stroke, HTN, uterine fibroids, and anemia who presented on 7/25 with chest discomfort and dizziness in the setting of heavy menstrual bleeding.  Hgb 5.1.  She was transfused 2 units PRBC and given IV iron .  Assessment and Plan:  Symptomatic anemia Hgb of 5.1 on admission from baseline of 9-10 Pt presented with fatigue, dizziness, and chest discomfort Anemia secondary to menorrhagia in the setting of uterine fibroids Transfused 2 units PRBC Hgb improved to 6.9 today post-blood, will give 1 additional unit Transfuse for Hgb goal > 7   Iron  deficiency anemia Iron  studies shows significant low iron , and ferritin and elevated TIBC Secondary to heavy menstrual bleeding Give IV iron  sucrose 200 mg x 1 Will need referral for outpatient IV iron  at the time of discharge   Abnormal uterine bleeding Known history of uterine fibroids with prolonged and heavy menses Evaluated by OB/GYN in 2023 for possible hysterectomy; however patient was asked to follow-up with neurology to assess perioperative risk due to her history of cryptogenic stroke She will need to be able to tolerate steep Trendelenburg for robotic hysterectomy She was previously lost to follow up Patient discussed with Dr. Izell via Secure Chat Will start Megace  800 mg BID Neurology asked to consult while hospitalized but if this is not possible then she will need outpatient neurology f/u   HTN BP stable with SBP in the 100-110s   Cryptogenic stroke Previously with post-COVID CVA Needs neurology clearance for surgery either inpatient or outpatient   Asthma Chronic and stable     Consultants: GYN telephone only Neurology?  Procedures: None  Antibiotics: None      Subjective: Feeling some better, chest pain is gone.   Heavy menses with cramping pain.   Objective: Vitals:   06/01/24 0918 06/01/24 1119  BP: 110/78 103/75  Pulse: 63 66  Resp: 16   Temp: 98.4 F (36.9 C) 98.2 F (36.8 C)  SpO2:  100%    Intake/Output Summary (Last 24 hours) at 06/01/2024 1345 Last data filed at 06/01/2024 1119 Gross per 24 hour  Intake 864.17 ml  Output --  Net 864.17 ml   Filed Weights   05/31/24 2333  Weight: 70.3 kg    Exam:  General:  Appears calm and comfortable and is in NAD Eyes:   normal lids, iris ENT:  grossly normal hearing, lips & tongue, mmm Cardiovascular:  RRR. No LE edema.  Respiratory:   CTA bilaterally with no wheezes/rales/rhonchi.  Normal respiratory effort. Abdomen:  soft, NT, ND Skin:  no rash or induration seen on limited exam Musculoskeletal:  grossly normal tone BUE/BLE, good ROM, no bony abnormality Psychiatric:  grossly normal mood and affect, speech fluent and appropriate, AOx3 Neurologic:  CN 2-12 grossly intact, moves all extremities in coordinated fashion  Data Reviewed: I have reviewed the patient's lab results since admission.  Pertinent labs for today include:  K+ 3.1 Iron  <5 Ferritin 4 WBC 5 Platelets 423, down from 499    Family Communication: None present  Disposition: Status is: Observation The patient remains OBS appropriate and will d/c before 2 midnights.     Time spent: 50 minutes  Unresulted Labs (From admission, onward)     Start     Ordered   06/02/24 0500  CBC with Differential/Platelet  Tomorrow morning,  R        06/01/24 1333   06/02/24 0500  Basic metabolic panel with GFR  Tomorrow morning,   R        06/01/24 1333             Author: Delon Herald, MD 06/01/2024 1:45 PM  For on call review [email or web address].

## 2024-06-02 LAB — CBC WITH DIFFERENTIAL/PLATELET
Abs Immature Granulocytes: 0.03 K/uL (ref 0.00–0.07)
Basophils Absolute: 0 K/uL (ref 0.0–0.1)
Basophils Relative: 1 %
Eosinophils Absolute: 0.2 K/uL (ref 0.0–0.5)
Eosinophils Relative: 3 %
HCT: 27.9 % — ABNORMAL LOW (ref 36.0–46.0)
Hemoglobin: 8 g/dL — ABNORMAL LOW (ref 12.0–15.0)
Immature Granulocytes: 1 %
Lymphocytes Relative: 26 %
Lymphs Abs: 1.6 K/uL (ref 0.7–4.0)
MCH: 21.8 pg — ABNORMAL LOW (ref 26.0–34.0)
MCHC: 28.7 g/dL — ABNORMAL LOW (ref 30.0–36.0)
MCV: 76 fL — ABNORMAL LOW (ref 80.0–100.0)
Monocytes Absolute: 0.6 K/uL (ref 0.1–1.0)
Monocytes Relative: 9 %
Neutro Abs: 3.8 K/uL (ref 1.7–7.7)
Neutrophils Relative %: 60 %
Platelets: 405 K/uL — ABNORMAL HIGH (ref 150–400)
RBC: 3.67 MIL/uL — ABNORMAL LOW (ref 3.87–5.11)
RDW: 25 % — ABNORMAL HIGH (ref 11.5–15.5)
WBC: 6.2 K/uL (ref 4.0–10.5)
nRBC: 0.6 % — ABNORMAL HIGH (ref 0.0–0.2)

## 2024-06-02 LAB — BASIC METABOLIC PANEL WITH GFR
Anion gap: 5 (ref 5–15)
BUN: 12 mg/dL (ref 6–20)
CO2: 24 mmol/L (ref 22–32)
Calcium: 8.9 mg/dL (ref 8.9–10.3)
Chloride: 109 mmol/L (ref 98–111)
Creatinine, Ser: 0.61 mg/dL (ref 0.44–1.00)
GFR, Estimated: >60 mL/min (ref >60)
Glucose, Bld: 95 mg/dL (ref 70–99)
Potassium: 3.6 mmol/L (ref 3.5–5.1)
Sodium: 138 mmol/L (ref 135–145)

## 2024-06-02 MED ORDER — MEGESTROL ACETATE 40 MG PO TABS: 80.0000 mg | ORAL_TABLET | Freq: Two times a day (BID) | ORAL | 1 refills | Status: AC

## 2024-06-02 MED ORDER — FERROUS SULFATE 325 (65 FE) MG PO TABS: 325.0000 mg | ORAL_TABLET | Freq: Every day | ORAL | 1 refills | Status: AC

## 2024-06-02 NOTE — Progress Notes (Signed)
   06/02/24 1115  TOC Brief Assessment  Insurance and Status Lapsed;Reviewed  Patient has primary care physician Yes  Home environment has been reviewed single family home  Prior level of function: independent  Prior/Current Home Services No current home services  Social Drivers of Health Review SDOH reviewed no interventions necessary  Readmission risk has been reviewed Yes  Transition of care needs no transition of care needs at this time    Pt does not currently have health insurance, but does have PCP. No TOC needs at this time.   Heather Saltness, MSW, LCSW Clinical Social Worker Inpatient Care Management 06/02/2024 11:16 AM

## 2024-06-02 NOTE — Discharge Summary (Signed)
 Physician Discharge Summary   Patient: Wendy Ross MRN: [id number] DOB: [birth year]  Admit date:     05/31/2024  Discharge date: 06/02/24  Discharge Physician: Delon Herald   PCP: Lorren Greig PARAS, NP   Recommendations at discharge:   Continue to take Megace  twice daily until you are able to follow up with gynecology (Dr. Izell) You are being referred to GYN and need to follow up for definitive treatment of your abnormal uterine bleeding (heavy periods); Dr. Izell' office should contact you with an appointment but please call them next week if you have not heard from them Take iron  daily; you are also being referred for IV iron  infusions as an outpatient Follow up with NP Lorren in 1-2 weeks  Discharge Diagnoses: Principal Problem:   Symptomatic anemia Active Problems:   Atypical chest pain   Iron  deficiency anemia due to chronic blood loss   Hospital Course: 48yo with h/o cryptogenic stroke, HTN, uterine fibroids, and anemia who presented on 7/25 with chest discomfort and dizziness in the setting of heavy menstrual bleeding.  Hgb 5.1.  She was transfused 3 units PRBC and given IV iron .  Assessment and Plan:  Symptomatic anemia Hgb of 5.1 on admission from baseline of 9-10 Pt presented with fatigue, dizziness, and chest discomfort Anemia secondary to menorrhagia in the setting of uterine fibroids Transfused 3 units PRBC with appropriate Hgb response   Iron  deficiency anemia Iron  studies shows significant low iron , and ferritin and elevated TIBC Secondary to heavy menstrual bleeding Give IV iron  sucrose 200 mg x 1 Will need referral for outpatient IV iron  at the time of discharge   Abnormal uterine bleeding Known history of uterine fibroids with prolonged and heavy menses Evaluated by OB/GYN in 2023 for possible hysterectomy; however patient was asked to follow-up with neurology to assess perioperative risk due to her history of cryptogenic stroke She will need to  be able to tolerate steep Trendelenburg for robotic hysterectomy She was previously lost to follow up Patient discussed with Dr. Izell via Secure Chat Will start Megace  800 mg BID and continue until f/u with GYN Neurology provided input re: surgical risk (see below)   HTN BP stable with SBP in the 100-110s   Cryptogenic stroke Previously with post-COVID CVA She was found to have Spencer Grade 3 patent foramen ovale which increases her risk of recurrent stroke, particularly if placed in Trendelenburg position However, she is far enough out from her previous stroke that this risk is somewhat diminished Ongoing discussion about surgical options and risks/benefits will need to be held with a shared decision making model between the patient and Dr. Izell   Asthma Chronic and stable       Consultants: GYN telephone only Neurology?   Procedures: None   Antibiotics: None   Pain control - [place]  Controlled Substance Reporting System database was reviewed. and patient was instructed, not to drive, operate heavy machinery, perform activities at heights, swimming or participation in water activities or provide baby-sitting services while on Pain, Sleep and Anxiety Medications; until their outpatient Physician has advised to do so again. Also recommended to not to take more than prescribed Pain, Sleep and Anxiety Medications.    Disposition: Home Diet recommendation:  Regular diet DISCHARGE MEDICATION: Allergies as of 06/02/2024       Reactions   Latex Itching   Percocet [oxycodone -acetaminophen ] Itching, Nausea Only        Medication List     STOP taking these medications  doxycycline  100 MG capsule Commonly known as: VIBRAMYCIN    hydrOXYzine  25 MG capsule Commonly known as: VISTARIL    naproxen  375 MG tablet Commonly known as: NAPROSYN    sertraline  25 MG tablet Commonly known as: ZOLOFT    triamcinolone  ointment 0.5 % Commonly known as: KENALOG         TAKE these medications    ferrous sulfate  325 (65 FE) MG tablet Take 1 tablet (325 mg total) by mouth daily with breakfast. What changed: when to take this   megestrol  40 MG tablet Commonly known as: MEGACE  Take 2 tablets (80 mg total) by mouth 2 (two) times daily. What changed: how much to take        Discharge Exam:   Subjective: Feels well, bleeding is dramatically improved, wants to go home.   Objective: Vitals:   06/01/24 2011 06/02/24 0520  BP: 106/75 126/81  Pulse: (!) 53   Resp: 18 20  Temp: 98.4 F (36.9 C) 97.8 F (36.6 C)  SpO2: 100% 100%    Intake/Output Summary (Last 24 hours) at 06/02/2024 1002 Last data filed at 06/02/2024 0900 Gross per 24 hour  Intake 803.83 ml  Output --  Net 803.83 ml   Filed Weights   05/31/24 2333  Weight: 70.3 kg    Exam:  General:  Appears calm and comfortable and is in NAD Eyes:   normal lids, iris ENT:  grossly normal hearing, lips & tongue, mmm Cardiovascular:  RRR. No LE edema.  Respiratory:   CTA bilaterally with no wheezes/rales/rhonchi.  Normal respiratory effort. Abdomen:  soft, NT, ND Skin:  no rash or induration seen on limited exam Musculoskeletal:  grossly normal tone BUE/BLE, good ROM, no bony abnormality Psychiatric:  grossly normal mood and affect, speech fluent and appropriate, AOx3 Neurologic:  CN 2-12 grossly intact, moves all extremities in coordinated fashion  Data Reviewed: I have reviewed the patient's lab results since admission.  Pertinent labs for today include:   Normal BMP WBC 6.2 Hgb 8 Platelets 405    Condition at discharge: stable  The results of significant diagnostics from this hospitalization (including imaging, microbiology, ancillary and laboratory) are listed below for reference.   Imaging Studies: DG Chest Port 1 View Result Date: 05/31/2024 CLINICAL DATA:  Central chest pain for several weeks, initial encounter EXAM: PORTABLE CHEST 1 VIEW COMPARISON:  11/27/2020  FINDINGS: The heart size and mediastinal contours are within normal limits. Both lungs are clear. The visualized skeletal structures are unremarkable. IMPRESSION: No active disease. Electronically Signed   By: Oneil Devonshire M.D.   On: 05/31/2024 19:23    Microbiology: Results for orders placed or performed during the hospital encounter of 09/28/23  Resp panel by RT-PCR (RSV, Flu A&B, Covid) Anterior Nasal Swab     Status: None   Collection Time: 09/28/23  7:30 AM   Specimen: Anterior Nasal Swab  Result Value Ref Range Status   SARS Coronavirus 2 by RT PCR NEGATIVE NEGATIVE Final    Comment: (NOTE) SARS-CoV-2 target nucleic acids are NOT DETECTED.  The SARS-CoV-2 RNA is generally detectable in upper respiratory specimens during the acute phase of infection. The lowest concentration of SARS-CoV-2 viral copies this assay can detect is 138 copies/mL. A negative result does not preclude SARS-Cov-2 infection and should not be used as the sole basis for treatment or other patient management decisions. A negative result may occur with  improper specimen collection/handling, submission of specimen other than nasopharyngeal swab, presence of viral mutation(s) within the areas targeted by this  assay, and inadequate number of viral copies(<138 copies/mL). A negative result must be combined with clinical observations, patient history, and epidemiological information. The expected result is Negative.  Fact Sheet for Patients:  BloggerCourse.com  Fact Sheet for Healthcare Providers:  SeriousBroker.it  This test is no t yet approved or cleared by the [place]  FDA and  has been authorized for detection and/or diagnosis of SARS-CoV-2 by FDA under an Emergency Use Authorization (EUA). This EUA will remain  in effect (meaning this test can be used) for the duration of the COVID-19 declaration under Section 564(b)(1) of the Act, 21 U.S.C.section  360bbb-3(b)(1), unless the authorization is terminated  or revoked sooner.       Influenza A by PCR NEGATIVE NEGATIVE Final   Influenza B by PCR NEGATIVE NEGATIVE Final    Comment: (NOTE) The Xpert Xpress SARS-CoV-2/FLU/RSV plus assay is intended as an aid in the diagnosis of influenza from Nasopharyngeal swab specimens and should not be used as a sole basis for treatment. Nasal washings and aspirates are unacceptable for Xpert Xpress SARS-CoV-2/FLU/RSV testing.  Fact Sheet for Patients: BloggerCourse.com  Fact Sheet for Healthcare Providers: SeriousBroker.it  This test is not yet approved or cleared by the [place]  FDA and has been authorized for detection and/or diagnosis of SARS-CoV-2 by FDA under an Emergency Use Authorization (EUA). This EUA will remain in effect (meaning this test can be used) for the duration of the COVID-19 declaration under Section 564(b)(1) of the Act, 21 U.S.C. section 360bbb-3(b)(1), unless the authorization is terminated or revoked.     Resp Syncytial Virus by PCR NEGATIVE NEGATIVE Final    Comment: (NOTE) Fact Sheet for Patients: BloggerCourse.com  Fact Sheet for Healthcare Providers: SeriousBroker.it  This test is not yet approved or cleared by the [place]  FDA and has been authorized for detection and/or diagnosis of SARS-CoV-2 by FDA under an Emergency Use Authorization (EUA). This EUA will remain in effect (meaning this test can be used) for the duration of the COVID-19 declaration under Section 564(b)(1) of the Act, 21 U.S.C. section 360bbb-3(b)(1), unless the authorization is terminated or revoked.  Performed at [facility], [address] [place], [place] [address]   Wet prep, genital     Status: None   Collection Time: 09/28/23  7:31 AM   Specimen: Urine, Clean Catch  Result Value Ref Range Status    Yeast Wet Prep HPF POC NONE SEEN NONE SEEN Final    Comment: Specimen diluted due to transport tube containing more than 1 ml of saline, interpret results with caution.   Trich, Wet Prep NONE SEEN NONE SEEN Final   Clue Cells Wet Prep HPF POC NONE SEEN NONE SEEN Final   WBC, Wet Prep HPF POC <10 <10 Final   Sperm NONE SEEN  Final    Comment: Performed at [facility], [address], [place], [place] [address]    Labs: CBC: Recent Labs  Lab 05/31/24 1808 06/01/24 0008 06/01/24 1144 06/01/24 1716 06/02/24 0404  WBC 5.0 4.1 5.4  --  6.2  NEUTROABS  --   --   --   --  3.8  HGB 5.1* 5.0* 6.9* 7.9* 8.0*  HCT 20.2* 20.6* 25.4* 28.4* 27.9*  MCV 67.6* 69.4* 75.6*  --  76.0*  PLT 499* 423* 411*  --  405*   Basic Metabolic Panel: Recent Labs  Lab 05/31/24 1808 06/01/24 0008 06/01/24 1144 06/02/24 0404  NA 140 140 137 138  K 3.4* 3.1*  3.6 3.6  CL 107 109 109 109  CO2 22 24 22 24   GLUCOSE 93 91 83 95  BUN 7 6 9 12   CREATININE 0.63 0.57 0.65 0.61  CALCIUM  9.0 8.7* 8.5* 8.9   Liver Function Tests: Recent Labs  Lab 05/31/24 1808 06/01/24 0008  AST 21 16  ALT 10 10  ALKPHOS 54 44  BILITOT 0.3 0.6  PROT 6.8 6.8  ALBUMIN 4.0 3.4*   CBG: No results for input(s): GLUCAP in the last 168 hours.  Discharge time spent: greater than 30 minutes.  Signed: Delon Herald, MD [facility] 06/02/2024

## 2024-06-03 ENCOUNTER — Telehealth: Payer: Self-pay | Admitting: Family Medicine

## 2024-06-03 ENCOUNTER — Ambulatory Visit: Payer: Self-pay | Admitting: Family

## 2024-06-03 ENCOUNTER — Telehealth: Payer: Self-pay

## 2024-06-03 LAB — BPAM RBC
Blood Product Expiration Date: 202508232359
Blood Product Expiration Date: 202508232359
Blood Product Expiration Date: 202508232359
ISSUE DATE / TIME: 202507260420
ISSUE DATE / TIME: 202507260852
ISSUE DATE / TIME: 202507261407
Unit Type and Rh: 5100
Unit Type and Rh: 5100
Unit Type and Rh: 5100

## 2024-06-03 LAB — TYPE AND SCREEN
ABO/RH(D): O POS
Antibody Screen: NEGATIVE
Unit division: 0
Unit division: 0
Unit division: 0

## 2024-06-03 NOTE — Telephone Encounter (Signed)
 Called patient and left detailed message with call back number about upcoming appointment.

## 2024-06-03 NOTE — Transitions of Care (Post Inpatient/ED Visit) (Unsigned)
   06/03/2024  Name: Wendy Ross MRN: [id number] DOB: May 12, 1976  Today's TOC FU Call Status: Today's TOC FU Call Status:: Unsuccessful Call (1st Attempt) Unsuccessful Call (1st Attempt) Date: 06/03/24  Attempted to reach the patient regarding the most recent Inpatient/ED visit.  Follow Up Plan: Additional outreach attempts will be made to reach the patient to complete the Transitions of Care (Post Inpatient/ED visit) call.   Signature Julian Lemmings, LPN [facility] Nurse Health Advisor Direct Dial [phone]

## 2024-06-04 NOTE — Transitions of Care (Post Inpatient/ED Visit) (Unsigned)
   06/04/2024  Name: Wendy Ross MRN: [id number] DOB: 1976/08/03  Today's TOC FU Call Status: Today's TOC FU Call Status:: Unsuccessful Call (2nd Attempt) Unsuccessful Call (1st Attempt) Date: 06/03/24 Unsuccessful Call (2nd Attempt) Date: 06/04/24  Attempted to reach the patient regarding the most recent Inpatient/ED visit.  Follow Up Plan: Additional outreach attempts will be made to reach the patient to complete the Transitions of Care (Post Inpatient/ED visit) call.   Signature Julian Lemmings, LPN [facility] Nurse Health Advisor Direct Dial [phone]

## 2024-06-05 NOTE — Telephone Encounter (Signed)
 Noted appointment on 8/6

## 2024-06-05 NOTE — Transitions of Care (Post Inpatient/ED Visit) (Signed)
   06/05/2024  Name: Wendy Ross MRN: [id number] DOB: 10-Nov-1975  Today's TOC FU Call Status: Today's TOC FU Call Status:: Successful TOC FU Call Completed Unsuccessful Call (1st Attempt) Date: 06/03/24 Unsuccessful Call (2nd Attempt) Date: 06/04/24 [facility] FU Call Complete Date: 06/05/24 Patient's Name and Date of Birth confirmed.  Transition Care Management Follow-up Telephone Call Date of Discharge: 06/02/24 Discharge Facility: [facility]) Type of Discharge: Inpatient Admission Primary Inpatient Discharge Diagnosis:: anemia How have you been since you were released from the hospital?: Better Any questions or concerns?: No  Items Reviewed: Did you receive and understand the discharge instructions provided?: Yes Medications obtained,verified, and reconciled?: Yes (Medications Reviewed) Any new allergies since your discharge?: No Dietary orders reviewed?: Yes Do you have support at home?: Yes People in Home [RPT]: child(ren), adult  Medications Reviewed Today: Medications Reviewed Today     Reviewed by Emmitt Pan, LPN (Licensed Practical Nurse) on 06/05/24 at 1423  Med List Status: <None>   Medication Order Taking? Sig Documenting Provider Last Dose Status Informant  ferrous sulfate  325 (65 FE) MG tablet [id number] Yes Take 1 tablet (325 mg total) by mouth daily with breakfast. Barbarann Nest, MD  Active   megestrol  (MEGACE ) 40 MG tablet [id number]  Take 2 tablets (80 mg total) by mouth 2 (two) times daily.  Patient not taking: Reported on 06/05/2024   Barbarann Nest, MD  Active             Home Care and Equipment/Supplies: Were Home Health Services Ordered?: NA Any new equipment or medical supplies ordered?: NA  Functional Questionnaire: Do you need assistance with bathing/showering or dressing?: No Do you need assistance with meal preparation?: No Do you need assistance with eating?: No Do you have difficulty maintaining continence: No Do you need  assistance with getting out of bed/getting out of a chair/moving?: No Do you have difficulty managing or taking your medications?: No  Follow up appointments reviewed: PCP Follow-up appointment confirmed?: Yes Date of PCP follow-up appointment?: 06/12/24 Follow-up Provider: [facility] Follow-up appointment confirmed?: NA Do you need transportation to your follow-up appointment?: No Do you understand care options if your condition(s) worsen?: Yes-patient verbalized understanding    SIGNATURE Pan Emmitt, LPN [facility] Nurse Health Advisor Direct Dial [phone]

## 2024-06-05 NOTE — Telephone Encounter (Signed)
Keep all scheduled appointments 

## 2024-06-11 ENCOUNTER — Encounter: Payer: Self-pay | Admitting: Obstetrics and Gynecology

## 2024-06-12 ENCOUNTER — Inpatient Hospital Stay: Payer: Self-pay | Admitting: Family Medicine

## 2024-06-20 ENCOUNTER — Ambulatory Visit: Payer: Self-pay | Admitting: Obstetrics and Gynecology

## 2024-11-20 ENCOUNTER — Encounter (HOSPITAL_BASED_OUTPATIENT_CLINIC_OR_DEPARTMENT_OTHER): Payer: Self-pay

## 2024-11-20 ENCOUNTER — Emergency Department (HOSPITAL_BASED_OUTPATIENT_CLINIC_OR_DEPARTMENT_OTHER)
Admission: EM | Admit: 2024-11-20 | Discharge: 2024-11-21 | Disposition: A | Discharge status: Home / Self Care | Payer: Self-pay | Attending: Emergency Medicine | Admitting: Emergency Medicine

## 2024-11-20 ENCOUNTER — Other Ambulatory Visit: Payer: Self-pay

## 2024-11-20 DIAGNOSIS — R42 Dizziness and giddiness: Secondary | ICD-10-CM | POA: Insufficient documentation

## 2024-11-20 DIAGNOSIS — R519 Headache, unspecified: Secondary | ICD-10-CM | POA: Insufficient documentation

## 2024-11-20 DIAGNOSIS — Z91040 Latex allergy status: Secondary | ICD-10-CM | POA: Insufficient documentation

## 2024-11-20 LAB — URINALYSIS, ROUTINE W REFLEX MICROSCOPIC
Bilirubin Urine: NEGATIVE
Glucose, UA: NEGATIVE mg/dL
Hgb urine dipstick: NEGATIVE
Ketones, ur: NEGATIVE mg/dL
Leukocytes,Ua: NEGATIVE
Nitrite: NEGATIVE
Protein, ur: NEGATIVE mg/dL
Specific Gravity, Urine: <1.005 — ABNORMAL LOW (ref 1.005–1.030)
pH: 6.5 (ref 5.0–8.0)

## 2024-11-20 LAB — COMPREHENSIVE METABOLIC PANEL WITH GFR
ALT: 13 U/L (ref 0–44)
AST: 13 U/L — ABNORMAL LOW (ref 15–41)
Albumin: 4.2 g/dL (ref 3.5–5.0)
Alkaline Phosphatase: 52 U/L (ref 38–126)
Anion gap: 10 (ref 5–15)
BUN: 6 mg/dL (ref 6–20)
CO2: 25 mmol/L (ref 22–32)
Calcium: 9.2 mg/dL (ref 8.9–10.3)
Chloride: 103 mmol/L (ref 98–111)
Creatinine, Ser: 0.68 mg/dL (ref 0.44–1.00)
GFR, Estimated: >60 mL/min
Glucose, Bld: 90 mg/dL (ref 70–99)
Potassium: 3.4 mmol/L — ABNORMAL LOW (ref 3.5–5.1)
Sodium: 139 mmol/L (ref 135–145)
Total Bilirubin: 0.3 mg/dL (ref 0.0–1.2)
Total Protein: 6.7 g/dL (ref 6.5–8.1)

## 2024-11-20 LAB — CBC
HCT: 31.6 % — ABNORMAL LOW (ref 36.0–46.0)
Hemoglobin: 9.8 g/dL — ABNORMAL LOW (ref 12.0–15.0)
MCH: 26.5 pg (ref 26.0–34.0)
MCHC: 31 g/dL (ref 30.0–36.0)
MCV: 85.4 fL (ref 80.0–100.0)
Platelets: 388 K/uL (ref 150–400)
RBC: 3.7 MIL/uL — ABNORMAL LOW (ref 3.87–5.11)
RDW: 19.6 % — ABNORMAL HIGH (ref 11.5–15.5)
WBC: 4.7 K/uL (ref 4.0–10.5)
nRBC: 0 % (ref 0.0–0.2)

## 2024-11-20 LAB — HCG, SERUM, QUALITATIVE: Preg, Serum: NEGATIVE

## 2024-11-20 NOTE — ED Triage Notes (Signed)
 Pt presents via POV c/o dizziness, left eye pain, weakness, and nausea x1 hr PTA. A&O x4. Reports pain on left eye described as pressure type.

## 2024-11-21 ENCOUNTER — Emergency Department (HOSPITAL_BASED_OUTPATIENT_CLINIC_OR_DEPARTMENT_OTHER): Payer: Self-pay

## 2024-11-21 MED ORDER — KETOROLAC TROMETHAMINE 30 MG/ML IJ SOLN
30.0000 mg | Freq: Once | INTRAMUSCULAR | Status: AC
  Administered 2024-11-21: 30 mg via INTRAMUSCULAR
  Filled 2024-11-21: qty 1

## 2024-11-21 NOTE — ED Notes (Signed)
 Patient transported to CT

## 2024-11-21 NOTE — ED Provider Notes (Signed)
 "  [facility] AT [facility]  Provider Note  CSN: [id number] Arrival date & time: 11/20/24 2116  History Chief Complaint  Patient presents with   Dizziness    BERLIN VIERECK is a 49 y.o. female with history of anemia from heavy menses reports she was in her usual state of health until around 2030hrs when she began to have a left sided headache described as a pressure with eye pain/watering, nausea and weakness. She felt like she might pass out but did not lose consciousness. Pain was not sudden in onset. No prior history of similar.    Home Medications Prior to Admission medications  Medication Sig Start Date End Date Taking? Authorizing Provider  ferrous sulfate  325 (65 FE) MG tablet Take 1 tablet (325 mg total) by mouth daily with breakfast. 06/02/24 08/01/24  Barbarann Nest, MD     Allergies    Latex and Percocet [oxycodone -acetaminophen ]   Review of Systems   Review of Systems Please see HPI for pertinent positives and negatives  Physical Exam BP 108/62 (BP Location: Left Arm)   Pulse 71   Temp 98.2 F (36.8 C) (Oral)   Resp 18   SpO2 99%   Physical Exam Vitals and nursing note reviewed.  Constitutional:      Appearance: Normal appearance.  HENT:     Head: Normocephalic and atraumatic.     Nose: Nose normal.     Mouth/Throat:     Mouth: Mucous membranes are moist.  Eyes:     Extraocular Movements: Extraocular movements intact.     Conjunctiva/sclera: Conjunctivae normal.  Cardiovascular:     Rate and Rhythm: Normal rate.  Pulmonary:     Effort: Pulmonary effort is normal.     Breath sounds: Normal breath sounds.  Abdominal:     General: Abdomen is flat.     Palpations: Abdomen is soft.     Tenderness: There is no abdominal tenderness.  Musculoskeletal:        General: No swelling. Normal range of motion.     Cervical back: Neck supple.  Skin:    General: Skin is warm and dry.  Neurological:     General: No focal deficit  present.     Mental Status: She is alert and oriented to person, place, and time.     Cranial Nerves: No cranial nerve deficit.     Sensory: No sensory deficit.     Motor: No weakness.  Psychiatric:        Mood and Affect: Mood normal.     ED Results / Procedures / Treatments   EKG EKG Interpretation Date/Time:  Wednesday November 20 2024 21:35:30 EST Ventricular Rate:  69 PR Interval:  175 QRS Duration:  90 QT Interval:  393 QTC Calculation: 421 R Axis:   69  Text Interpretation: Sinus rhythm Probable left atrial enlargement No significant change since last tracing Confirmed by Roselyn Dunnings [phone]) on 11/21/2024 12:20:03 AM  Procedures Procedures  Medications Ordered in the ED Medications  ketorolac  (TORADOL ) 30 MG/ML injection 30 mg (30 mg Intramuscular Given 11/21/24 0030)    Initial Impression and Plan  Patient here with headache with some migrainous or cluster features, but no history of similar. She is mostly concerned that her symptoms are due to a recurrence of anemia, however labs done in triage show CBC with improved Hgb from prior baseline. CMP UA and HCG are unremarkable. Will send for CT given new headache.   ED Course  Clinical Course as of 11/21/24 0128  Thu Nov 21, 2024  0111 I personally viewed the images from radiology studies and agree with radiologist interpretation: CT is normal.  [CS]  0127 Patient reports pain is improved. She is ambulatory with a steady gait. Recommend PCP follow up, RTED for any other concerns.   [CS]    Clinical Course User Index [CS] Roselyn Carlin NOVAK, MD     MDM Rules/Calculators/A&P Medical Decision Making Problems Addressed: Acute nonintractable headache, unspecified headache type: acute illness or injury Dizziness: acute illness or injury  Amount and/or Complexity of Data Reviewed Labs: ordered. Decision-making details documented in ED Course. Radiology: ordered and independent interpretation performed.  Decision-making details documented in ED Course. ECG/medicine tests: ordered and independent interpretation performed. Decision-making details documented in ED Course.  Risk Prescription drug management.     Final Clinical Impression(s) / ED Diagnoses Final diagnoses:  Acute nonintractable headache, unspecified headache type  Dizziness    Rx / DC Orders ED Discharge Orders     None        Roselyn Carlin NOVAK, MD 11/21/24 0128  "
